# Patient Record
Sex: Female | Born: 1955 | Race: White | Hispanic: No | Marital: Single | Smoking: Former smoker
Health system: Southern US, Community
[De-identification: ages and names within clinical notes are randomized; demographics above are authoritative.]

## PROBLEM LIST (undated history)

## (undated) DIAGNOSIS — L8990 Pressure ulcer of unspecified site, unspecified stage: Secondary | ICD-10-CM

## (undated) DIAGNOSIS — F1010 Alcohol abuse, uncomplicated: Secondary | ICD-10-CM

## (undated) DIAGNOSIS — R410 Disorientation, unspecified: Secondary | ICD-10-CM

## (undated) DIAGNOSIS — D649 Anemia, unspecified: Secondary | ICD-10-CM

## (undated) DIAGNOSIS — F32A Depression, unspecified: Secondary | ICD-10-CM

## (undated) DIAGNOSIS — F322 Major depressive disorder, single episode, severe without psychotic features: Secondary | ICD-10-CM

## (undated) DIAGNOSIS — E876 Hypokalemia: Secondary | ICD-10-CM

## (undated) DIAGNOSIS — G9341 Metabolic encephalopathy: Secondary | ICD-10-CM

## (undated) DIAGNOSIS — N939 Abnormal uterine and vaginal bleeding, unspecified: Secondary | ICD-10-CM

## (undated) DIAGNOSIS — C541 Malignant neoplasm of endometrium: Secondary | ICD-10-CM

## (undated) HISTORY — DX: Alcohol abuse, uncomplicated: F10.10

## (undated) HISTORY — DX: Anemia, unspecified: D64.9

## (undated) HISTORY — DX: Major depressive disorder, single episode, severe without psychotic features: F32.2

## (undated) HISTORY — DX: Abnormal uterine and vaginal bleeding, unspecified: N93.9

## (undated) HISTORY — DX: Disorientation, unspecified: R41.0

## (undated) HISTORY — DX: Metabolic encephalopathy: G93.41

## (undated) HISTORY — DX: Hypomagnesemia: E83.42

## (undated) HISTORY — DX: Malignant neoplasm of endometrium: C54.1

## (undated) HISTORY — DX: Pressure ulcer of unspecified site, unspecified stage: L89.90

## (undated) HISTORY — DX: Hypokalemia: E87.6

## (undated) HISTORY — DX: Depression, unspecified: F32.A

---

## 2009-03-21 ENCOUNTER — Inpatient Hospital Stay: Payer: Self-pay | Admitting: Psychiatry

## 2009-04-20 ENCOUNTER — Ambulatory Visit: Payer: Self-pay

## 2020-07-24 ENCOUNTER — Inpatient Hospital Stay
Admission: EM | Admit: 2020-07-24 | Discharge: 2020-07-28 | DRG: 071 | Disposition: A | Discharge status: Home / Self Care | Payer: Self-pay | Attending: Internal Medicine | Admitting: Internal Medicine

## 2020-07-24 ENCOUNTER — Emergency Department: Payer: Self-pay

## 2020-07-24 DIAGNOSIS — Y92009 Unspecified place in unspecified non-institutional (private) residence as the place of occurrence of the external cause: Secondary | ICD-10-CM

## 2020-07-24 DIAGNOSIS — E871 Hypo-osmolality and hyponatremia: Secondary | ICD-10-CM | POA: Diagnosis present

## 2020-07-24 DIAGNOSIS — F322 Major depressive disorder, single episode, severe without psychotic features: Secondary | ICD-10-CM

## 2020-07-24 DIAGNOSIS — Y9301 Activity, walking, marching and hiking: Secondary | ICD-10-CM | POA: Diagnosis present

## 2020-07-24 DIAGNOSIS — E538 Deficiency of other specified B group vitamins: Secondary | ICD-10-CM | POA: Diagnosis present

## 2020-07-24 DIAGNOSIS — Z20822 Contact with and (suspected) exposure to covid-19: Secondary | ICD-10-CM | POA: Diagnosis present

## 2020-07-24 DIAGNOSIS — F1290 Cannabis use, unspecified, uncomplicated: Secondary | ICD-10-CM | POA: Diagnosis present

## 2020-07-24 DIAGNOSIS — F1020 Alcohol dependence, uncomplicated: Secondary | ICD-10-CM | POA: Diagnosis present

## 2020-07-24 DIAGNOSIS — Y92828 Other wilderness area as the place of occurrence of the external cause: Secondary | ICD-10-CM

## 2020-07-24 DIAGNOSIS — E876 Hypokalemia: Secondary | ICD-10-CM | POA: Diagnosis present

## 2020-07-24 DIAGNOSIS — W1830XA Fall on same level, unspecified, initial encounter: Secondary | ICD-10-CM | POA: Diagnosis present

## 2020-07-24 DIAGNOSIS — D649 Anemia, unspecified: Secondary | ICD-10-CM | POA: Diagnosis present

## 2020-07-24 DIAGNOSIS — R000 Tachycardia, unspecified: Secondary | ICD-10-CM | POA: Diagnosis present

## 2020-07-24 DIAGNOSIS — F10239 Alcohol dependence with withdrawal, unspecified: Secondary | ICD-10-CM | POA: Diagnosis present

## 2020-07-24 DIAGNOSIS — K760 Fatty (change of) liver, not elsewhere classified: Secondary | ICD-10-CM | POA: Diagnosis present

## 2020-07-24 DIAGNOSIS — K7010 Alcoholic hepatitis without ascites: Secondary | ICD-10-CM | POA: Diagnosis present

## 2020-07-24 DIAGNOSIS — R4182 Altered mental status, unspecified: Secondary | ICD-10-CM

## 2020-07-24 DIAGNOSIS — R17 Unspecified jaundice: Secondary | ICD-10-CM

## 2020-07-24 DIAGNOSIS — R410 Disorientation, unspecified: Secondary | ICD-10-CM

## 2020-07-24 DIAGNOSIS — G9341 Metabolic encephalopathy: Principal | ICD-10-CM

## 2020-07-24 DIAGNOSIS — F1010 Alcohol abuse, uncomplicated: Secondary | ICD-10-CM

## 2020-07-24 DIAGNOSIS — F32A Depression, unspecified: Secondary | ICD-10-CM

## 2020-07-24 DIAGNOSIS — E860 Dehydration: Secondary | ICD-10-CM | POA: Diagnosis present

## 2020-07-24 DIAGNOSIS — R471 Dysarthria and anarthria: Secondary | ICD-10-CM | POA: Diagnosis present

## 2020-07-24 DIAGNOSIS — Z87891 Personal history of nicotine dependence: Secondary | ICD-10-CM

## 2020-07-24 DIAGNOSIS — E872 Acidosis: Secondary | ICD-10-CM | POA: Diagnosis present

## 2020-07-24 DIAGNOSIS — M47812 Spondylosis without myelopathy or radiculopathy, cervical region: Secondary | ICD-10-CM | POA: Diagnosis present

## 2020-07-24 LAB — ETHANOL: Alcohol, Ethyl (B): <10 mg/dL (ref <10)

## 2020-07-24 LAB — CBC
HCT: 40 % (ref 36.0–46.0)
HCT: 30.3 % — ABNORMAL LOW (ref 36.0–46.0)
Hemoglobin: 14.1 g/dL (ref 12.0–15.0)
Hemoglobin: 11 g/dL — ABNORMAL LOW (ref 12.0–15.0)
MCH: 35.7 pg — ABNORMAL HIGH (ref 26.0–34.0)
MCH: 36.9 pg — ABNORMAL HIGH (ref 26.0–34.0)
MCHC: 35.3 g/dL (ref 30.0–36.0)
MCHC: 36.3 g/dL — ABNORMAL HIGH (ref 30.0–36.0)
MCV: 101.3 fL — ABNORMAL HIGH (ref 80.0–100.0)
MCV: 101.7 fL — ABNORMAL HIGH (ref 80.0–100.0)
Platelets: 470 10*3/uL — ABNORMAL HIGH (ref 150–400)
Platelets: 328 10*3/uL (ref 150–400)
RBC: 3.95 MIL/uL (ref 3.87–5.11)
RBC: 2.98 MIL/uL — ABNORMAL LOW (ref 3.87–5.11)
RDW: 15.9 % — ABNORMAL HIGH (ref 11.5–15.5)
RDW: 16 % — ABNORMAL HIGH (ref 11.5–15.5)
WBC: 11.8 10*3/uL — ABNORMAL HIGH (ref 4.0–10.5)
WBC: 12.8 10*3/uL — ABNORMAL HIGH (ref 4.0–10.5)
nRBC: 0.2 % (ref 0.0–0.2)
nRBC: 0.2 % (ref 0.0–0.2)

## 2020-07-24 LAB — URINALYSIS, COMPLETE (UACMP) WITH MICROSCOPIC
Glucose, UA: NEGATIVE mg/dL
Hgb urine dipstick: NEGATIVE
Ketones, ur: 20 mg/dL — AB
Leukocytes,Ua: NEGATIVE
Nitrite: NEGATIVE
Protein, ur: NEGATIVE mg/dL
Specific Gravity, Urine: 1.015 (ref 1.005–1.030)
pH: 5 (ref 5.0–8.0)

## 2020-07-24 LAB — MAGNESIUM
Magnesium: 1.6 mg/dL — ABNORMAL LOW (ref 1.7–2.4)
Magnesium: 1.9 mg/dL (ref 1.7–2.4)

## 2020-07-24 LAB — URINE DRUG SCREEN, QUALITATIVE (ARMC ONLY)
Amphetamines, Ur Screen: NOT DETECTED
Barbiturates, Ur Screen: NOT DETECTED
Benzodiazepine, Ur Scrn: NOT DETECTED
Cannabinoid 50 Ng, Ur ~~LOC~~: NOT DETECTED
Cocaine Metabolite,Ur ~~LOC~~: NOT DETECTED
MDMA (Ecstasy)Ur Screen: NOT DETECTED
Methadone Scn, Ur: NOT DETECTED
Opiate, Ur Screen: NOT DETECTED
Phencyclidine (PCP) Ur S: NOT DETECTED
Tricyclic, Ur Screen: NOT DETECTED

## 2020-07-24 LAB — COMPREHENSIVE METABOLIC PANEL
ALT: 26 U/L (ref 0–44)
AST: 80 U/L — ABNORMAL HIGH (ref 15–41)
Albumin: 2.7 g/dL — ABNORMAL LOW (ref 3.5–5.0)
Alkaline Phosphatase: 122 U/L (ref 38–126)
Anion gap: 19 — ABNORMAL HIGH (ref 5–15)
BUN: 9 mg/dL (ref 8–23)
CO2: 28 mmol/L (ref 22–32)
Calcium: 8.2 mg/dL — ABNORMAL LOW (ref 8.9–10.3)
Chloride: 85 mmol/L — ABNORMAL LOW (ref 98–111)
Creatinine, Ser: 0.73 mg/dL (ref 0.44–1.00)
GFR, Estimated: >60 mL/min (ref >60)
Glucose, Bld: 122 mg/dL — ABNORMAL HIGH (ref 70–99)
Potassium: 2.4 mmol/L — CL (ref 3.5–5.1)
Sodium: 132 mmol/L — ABNORMAL LOW (ref 135–145)
Total Bilirubin: 5.5 mg/dL — ABNORMAL HIGH (ref 0.3–1.2)
Total Protein: 7.2 g/dL (ref 6.5–8.1)

## 2020-07-24 LAB — PHOSPHORUS: Phosphorus: 1.7 mg/dL — ABNORMAL LOW (ref 2.5–4.6)

## 2020-07-24 LAB — AMMONIA: Ammonia: <9 umol/L — ABNORMAL LOW (ref 9–35)

## 2020-07-24 LAB — PROTIME-INR
INR: 1.3 — ABNORMAL HIGH (ref 0.8–1.2)
Prothrombin Time: 16.4 seconds — ABNORMAL HIGH (ref 11.4–15.2)

## 2020-07-24 LAB — LACTIC ACID, PLASMA: Lactic Acid, Venous: 3.4 mmol/L (ref 0.5–1.9)

## 2020-07-24 MED ORDER — ONDANSETRON HCL 4 MG/2ML IJ SOLN
4.0000 mg | Freq: Four times a day (QID) | INTRAMUSCULAR | Status: DC | PRN
  Administered 2020-07-24: 4 mg via INTRAVENOUS
  Filled 2020-07-24: qty 2

## 2020-07-24 MED ORDER — POTASSIUM CHLORIDE 10 MEQ/100ML IV SOLN
10.0000 meq | Freq: Once | INTRAVENOUS | Status: AC
  Administered 2020-07-24: 10 meq via INTRAVENOUS
  Filled 2020-07-24: qty 100

## 2020-07-24 MED ORDER — ENOXAPARIN SODIUM 40 MG/0.4ML ~~LOC~~ SOLN
40.0000 mg | SUBCUTANEOUS | Status: DC
  Administered 2020-07-24 – 2020-07-27 (×4): 40 mg via SUBCUTANEOUS
  Filled 2020-07-24 (×4): qty 0.4

## 2020-07-24 MED ORDER — FOLIC ACID 1 MG PO TABS
1.0000 mg | ORAL_TABLET | Freq: Every day | ORAL | Status: DC
  Administered 2020-07-24 – 2020-07-28 (×5): 1 mg via ORAL
  Filled 2020-07-24 (×5): qty 1

## 2020-07-24 MED ORDER — SODIUM CHLORIDE 0.9 % IV BOLUS
1000.0000 mL | Freq: Once | INTRAVENOUS | Status: AC
  Administered 2020-07-24: 1000 mL via INTRAVENOUS

## 2020-07-24 MED ORDER — MAGNESIUM SULFATE 2 GM/50ML IV SOLN
2.0000 g | Freq: Once | INTRAVENOUS | Status: AC
  Administered 2020-07-24: 2 g via INTRAVENOUS
  Filled 2020-07-24: qty 50

## 2020-07-24 MED ORDER — ADULT MULTIVITAMIN W/MINERALS CH
1.0000 | ORAL_TABLET | Freq: Every day | ORAL | Status: DC
  Administered 2020-07-24 – 2020-07-28 (×5): 1 via ORAL
  Filled 2020-07-24 (×5): qty 1

## 2020-07-24 MED ORDER — THIAMINE HCL 100 MG PO TABS
100.0000 mg | ORAL_TABLET | Freq: Every day | ORAL | Status: DC
  Administered 2020-07-25 – 2020-07-26 (×2): 100 mg via ORAL
  Filled 2020-07-24 (×2): qty 1

## 2020-07-24 MED ORDER — IBUPROFEN 400 MG PO TABS: 400.0000 mg | ORAL_TABLET | Freq: Four times a day (QID) | ORAL | Status: DC | PRN

## 2020-07-24 MED ORDER — SODIUM CHLORIDE 0.9 % IV BOLUS
500.0000 mL | Freq: Once | INTRAVENOUS | Status: AC
  Administered 2020-07-24: 500 mL via INTRAVENOUS

## 2020-07-24 MED ORDER — LORAZEPAM 1 MG PO TABS
1.0000 mg | ORAL_TABLET | ORAL | Status: DC | PRN
  Administered 2020-07-24: 1 mg via ORAL
  Filled 2020-07-24: qty 1

## 2020-07-24 MED ORDER — SODIUM CHLORIDE 0.9 % IV SOLN
1.0000 g | INTRAVENOUS | Status: DC
  Administered 2020-07-24 – 2020-07-25 (×2): 1 g via INTRAVENOUS
  Filled 2020-07-24 (×3): qty 10

## 2020-07-24 MED ORDER — SENNOSIDES-DOCUSATE SODIUM 8.6-50 MG PO TABS: 1.0000 | ORAL_TABLET | Freq: Every evening | ORAL | Status: DC | PRN

## 2020-07-24 MED ORDER — IOHEXOL 300 MG/ML  SOLN
100.0000 mL | Freq: Once | INTRAMUSCULAR | Status: AC | PRN
  Administered 2020-07-24: 100 mL via INTRAVENOUS

## 2020-07-24 MED ORDER — POTASSIUM CHLORIDE IN NACL 20-0.9 MEQ/L-% IV SOLN
INTRAVENOUS | Status: DC
  Filled 2020-07-24 (×3): qty 1000

## 2020-07-24 MED ORDER — POTASSIUM CHLORIDE CRYS ER 20 MEQ PO TBCR
40.0000 meq | EXTENDED_RELEASE_TABLET | Freq: Once | ORAL | Status: AC
  Administered 2020-07-24: 40 meq via ORAL
  Filled 2020-07-24: qty 2

## 2020-07-24 MED ORDER — SODIUM CHLORIDE 0.9 % IV SOLN: Freq: Once | INTRAVENOUS | Status: AC

## 2020-07-24 MED ORDER — THIAMINE HCL 100 MG/ML IJ SOLN
Freq: Once | INTRAVENOUS | Status: AC
  Filled 2020-07-24: qty 1000

## 2020-07-24 MED ORDER — ONDANSETRON HCL 4 MG PO TABS: 4.0000 mg | ORAL_TABLET | Freq: Four times a day (QID) | ORAL | Status: DC | PRN

## 2020-07-24 MED ORDER — POTASSIUM CHLORIDE 10 MEQ/100ML IV SOLN
10.0000 meq | INTRAVENOUS | Status: AC
  Administered 2020-07-24 (×4): 10 meq via INTRAVENOUS
  Filled 2020-07-24 (×4): qty 100

## 2020-07-24 MED ORDER — LORAZEPAM 2 MG/ML IJ SOLN
1.0000 mg | INTRAMUSCULAR | Status: DC | PRN
  Administered 2020-07-24: 2 mg via INTRAVENOUS
  Administered 2020-07-24 – 2020-07-25 (×2): 3 mg via INTRAVENOUS
  Filled 2020-07-24: qty 1
  Filled 2020-07-24: qty 2
  Filled 2020-07-24 (×2): qty 1

## 2020-07-24 MED ORDER — FOLIC ACID 1 MG PO TABS: 1.0000 mg | ORAL_TABLET | Freq: Every day | ORAL | Status: DC

## 2020-07-24 NOTE — ED Notes (Signed)
Patient in CT during shift report.

## 2020-07-24 NOTE — ED Triage Notes (Signed)
Pt BIB EMS from home for accidental fall. Pt states she slid out of the bed.  Pt has hx of ETOH abuse per EMS.

## 2020-07-24 NOTE — H&P (Signed)
Chief Complaint:  HPI: I fell earlier today and felt unwell after that Valerie Leonard is an 65 y.o. female with no significant past medical history except for chronic alcohol abuse and remote history of tobacco smoking, quit several years ago.  She is a poor historian and had difficulty recalling how she came to the ER.  She admits to drinking alcohol on daily, usually drinks wine.  She also smokes THC frequently.  As per patient, she was walking up a hill across her house to neighbors house when she accidentally slid and fell.  Denied hitting her head.  Denied any bone pains or difficulty with ambulation after fall.  Denied loss of consciousness but had difficulty recalling preceding events prior to fall.  She felt woozy after the fall , hence neighbor suggested EMS to be called and patient brought to the emergency room.In review of ED records however, patient had reported tripping on a rug at home.  History reviewed. No pertinent past medical history.  History reviewed. No pertinent surgical history.  No family history on file. Social History:  reports that she has quit smoking. Her smoking use included cigarettes and cigars. She has never used smokeless tobacco. She reports current alcohol use. She reports previous drug use.  Allergies: Not on File  (Not in a hospital admission)   Results for orders placed or performed during the hospital encounter of 07/24/20 (from the past 48 hour(s))  Ethanol     Status: None   Collection Time: 07/24/20  3:21 PM  Result Value Ref Range   Alcohol, Ethyl (B) <10 <10 mg/dL    Comment: (NOTE) Lowest detectable limit for serum alcohol is 10 mg/dL.  For medical purposes only. Performed at [facility], [address] [place], [place] [address]   CBC     Status: Abnormal   Collection Time: 07/24/20  3:21 PM  Result Value Ref Range   WBC 11.8 (H) 4.0 - 10.5 K/uL   RBC 3.95 3.87 - 5.11 MIL/uL   Hemoglobin 14.1 12.0 - 15.0 g/dL   HCT 10.240.0 72.536.0  - 36.646.0 %   MCV 101.3 (H) 80.0 - 100.0 fL   MCH 35.7 (H) 26.0 - 34.0 pg   MCHC 35.3 30.0 - 36.0 g/dL   RDW 44.015.9 (H) 34.711.5 - 42.515.5 %   Platelets 470 (H) 150 - 400 K/uL   nRBC 0.2 0.0 - 0.2 %    Comment: Performed at [facility], [address] [place], [place] [address]  Comprehensive metabolic panel     Status: Abnormal   Collection Time: 07/24/20  3:21 PM  Result Value Ref Range   Sodium 132 (L) 135 - 145 mmol/L   Potassium 2.4 (LL) 3.5 - 5.1 mmol/L    Comment: CRITICAL RESULT CALLED TO, READ BACK BY AND VERIFIED WITH RYAN WHITE AT 1610 ON 07/24/20 BY SS    Chloride 85 (L) 98 - 111 mmol/L   CO2 28 22 - 32 mmol/L   Glucose, Bld 122 (H) 70 - 99 mg/dL    Comment: Glucose reference range applies only to samples taken after fasting for at least 8 hours.   BUN 9 8 - 23 mg/dL   Creatinine, Ser 7.560.73 0.44 - 1.00 mg/dL   Calcium 8.2 (L) 8.9 - 10.3 mg/dL   Total Protein 7.2 6.5 - 8.1 g/dL   Albumin 2.7 (L) 3.5 - 5.0 g/dL   AST 80 (H) 15 - 41 U/L   ALT 26 0 - 44 U/L  Alkaline Phosphatase 122 38 - 126 U/L   Total Bilirubin 5.5 (H) 0.3 - 1.2 mg/dL   GFR, Estimated >16 >10 mL/min    Comment: (NOTE) Calculated using the CKD-EPI Creatinine Equation (2021)    Anion gap 19 (H) 5 - 15    Comment: Performed at [facility], [address] [place], [place] [address]  Magnesium     Status: Abnormal   Collection Time: 07/24/20  3:35 PM  Result Value Ref Range   Magnesium 1.6 (L) 1.7 - 2.4 mg/dL    Comment: Performed at [facility], [address] [place], [place] [address]  Urinalysis, Complete w Microscopic     Status: Abnormal   Collection Time: 07/24/20  4:47 PM  Result Value Ref Range   Color, Urine AMBER (A) YELLOW    Comment: BIOCHEMICALS MAY BE AFFECTED BY COLOR   APPearance HAZY (A) CLEAR   Specific Gravity, Urine 1.015 1.005 - 1.030   pH 5.0 5.0 - 8.0   Glucose, UA NEGATIVE NEGATIVE mg/dL   Hgb urine dipstick NEGATIVE NEGATIVE   Bilirubin Urine  SMALL (A) NEGATIVE   Ketones, ur 20 (A) NEGATIVE mg/dL   Protein, ur NEGATIVE NEGATIVE mg/dL   Nitrite NEGATIVE NEGATIVE   Leukocytes,Ua NEGATIVE NEGATIVE   RBC / HPF 0-5 0 - 5 RBC/hpf   WBC, UA 0-5 0 - 5 WBC/hpf   Bacteria, UA RARE (A) NONE SEEN   Squamous Epithelial / LPF 0-5 0 - 5   Mucus PRESENT    Hyaline Casts, UA PRESENT     Comment: Performed at [facility], [address] [place], [place] [address]  Urine Drug Screen, Qualitative ([facility] only)     Status: None   Collection Time: 07/24/20  4:47 PM  Result Value Ref Range   Tricyclic, Ur Screen NONE DETECTED NONE DETECTED   Amphetamines, Ur Screen NONE DETECTED NONE DETECTED   MDMA (Ecstasy)Ur Screen NONE DETECTED NONE DETECTED   Cocaine Metabolite,Ur [place] NONE DETECTED NONE DETECTED   Opiate, Ur Screen NONE DETECTED NONE DETECTED   Phencyclidine (PCP) Ur S NONE DETECTED NONE DETECTED   Cannabinoid 50 Ng, Ur [place] NONE DETECTED NONE DETECTED   Barbiturates, Ur Screen NONE DETECTED NONE DETECTED   Benzodiazepine, Ur Scrn NONE DETECTED NONE DETECTED   Methadone Scn, Ur NONE DETECTED NONE DETECTED    Comment: (NOTE) Tricyclics + metabolites, urine    Cutoff 1000 ng/mL Amphetamines + metabolites, urine  Cutoff 1000 ng/mL MDMA (Ecstasy), urine              Cutoff 500 ng/mL Cocaine Metabolite, urine          Cutoff 300 ng/mL Opiate + metabolites, urine        Cutoff 300 ng/mL Phencyclidine (PCP), urine         Cutoff 25 ng/mL Cannabinoid, urine                 Cutoff 50 ng/mL Barbiturates + metabolites, urine  Cutoff 200 ng/mL Benzodiazepine, urine              Cutoff 200 ng/mL Methadone, urine                   Cutoff 300 ng/mL  The urine drug screen provides only a preliminary, unconfirmed analytical test result and should not be used for non-medical purposes. Clinical consideration and professional judgment should be applied to any positive drug screen result due to possible interfering substances. A more specific  alternate chemical method must be used in order to obtain a confirmed analytical result. Gas chromatography / mass spectrometry (GC/MS) is the preferred confirm atory method. Performed at [facility], [address] [place], [place] [address]   Protime-INR     Status: Abnormal   Collection Time: 07/24/20  4:47 PM  Result Value Ref Range   Prothrombin Time 16.4 (H) 11.4 - 15.2 seconds   INR 1.3 (H) 0.8 - 1.2    Comment: (NOTE) INR goal varies based on device and disease states. Performed at [facility], [address] [place], [place] [address]   Lactic acid, plasma     Status: Abnormal   Collection Time: 07/24/20  5:56 PM  Result Value Ref Range   Lactic Acid, Venous 3.4 (HH) 0.5 - 1.9 mmol/L    Comment: CRITICAL RESULT CALLED TO, READ BACK BY AND VERIFIED WITH RYAN WHITE AT 1827 ON 07/24/20 BY SS Performed at [facility], [address] [place], [place] [address]   Ammonia     Status: Abnormal   Collection Time: 07/24/20  5:56 PM  Result Value Ref Range   Ammonia <9 (L) 9 - 35 umol/L    Comment: Performed at [facility], [address], [place], [place] [address]   CT Head Wo Contrast  Result Date: 07/24/2020 CLINICAL DATA:  Larey Seat out of bed. EXAM: CT HEAD WITHOUT CONTRAST CT CERVICAL SPINE WITHOUT CONTRAST TECHNIQUE: Multidetector CT imaging of the head and cervical spine was performed following the standard protocol without intravenous contrast. Multiplanar CT image reconstructions of the cervical spine were also generated. COMPARISON:  None. FINDINGS: CT HEAD FINDINGS Brain: No evidence of acute infarction, hemorrhage, hydrocephalus, extra-axial collection or mass lesion/mass effect. Mild generalized cerebral atrophy. Scattered moderate periventricular and subcortical white matter hypodensities are nonspecific, but favored to reflect chronic microvascular ischemic changes. Vascular: Calcified atherosclerosis at the skullbase. No  hyperdense vessel. Skull: Normal. Negative for fracture or focal lesion. Sinuses/Orbits: No acute finding. Other: None. CT CERVICAL SPINE FINDINGS Alignment: No traumatic malalignment. Reversal of the normal cervical lordosis. Skull base and vertebrae: No acute fracture. No primary bone lesion or focal pathologic process. Soft tissues and spinal canal: No prevertebral fluid or swelling. No visible canal hematoma. Disc levels: Mild C4-C5, moderate C5-C6, and severe C6-C7 disc height loss with associated endplate spurring. Upper chest: Negative. Other: None. IMPRESSION: 1. No acute intracranial abnormality. Mild cerebral atrophy and moderate chronic microvascular ischemic changes. 2. No acute cervical spine fracture or traumatic malalignment. Multilevel cervical spondylosis, severe at C6-C7. Electronically Signed   By: Obie Dredge M.D.   On: 07/24/2020 16:09   CT Cervical Spine Wo Contrast  Result Date: 07/24/2020 CLINICAL DATA:  Larey Seat out of bed. EXAM: CT HEAD WITHOUT CONTRAST CT CERVICAL SPINE WITHOUT CONTRAST TECHNIQUE: Multidetector CT imaging of the head and cervical spine was performed following the standard protocol without intravenous contrast. Multiplanar CT image reconstructions of the cervical spine were also generated. COMPARISON:  None. FINDINGS: CT HEAD FINDINGS Brain: No evidence of acute infarction, hemorrhage, hydrocephalus, extra-axial collection or mass lesion/mass effect. Mild generalized cerebral atrophy. Scattered moderate periventricular and subcortical white matter hypodensities are nonspecific, but favored to reflect chronic microvascular ischemic changes. Vascular: Calcified atherosclerosis at the skullbase. No hyperdense vessel. Skull: Normal. Negative for fracture or focal lesion. Sinuses/Orbits: No acute finding. Other: None. CT CERVICAL SPINE FINDINGS Alignment: No traumatic malalignment. Reversal of the normal cervical lordosis. Skull base and vertebrae: No acute fracture. No  primary  bone lesion or focal pathologic process. Soft tissues and spinal canal: No prevertebral fluid or swelling. No visible canal hematoma. Disc levels: Mild C4-C5, moderate C5-C6, and severe C6-C7 disc height loss with associated endplate spurring. Upper chest: Negative. Other: None. IMPRESSION: 1. No acute intracranial abnormality. Mild cerebral atrophy and moderate chronic microvascular ischemic changes. 2. No acute cervical spine fracture or traumatic malalignment. Multilevel cervical spondylosis, severe at C6-C7. Electronically Signed   By: Obie Dredge M.D.   On: 07/24/2020 16:09   CT ABDOMEN PELVIS W CONTRAST  Result Date: 07/24/2020 CLINICAL DATA:  Fall EXAM: CT ABDOMEN AND PELVIS WITH CONTRAST TECHNIQUE: Multidetector CT imaging of the abdomen and pelvis was performed using the standard protocol following bolus administration of intravenous contrast. CONTRAST:  OMNIPAQUE IOHEXOL 300 MG/ML  SOLN COMPARISON:  None. FINDINGS: Lower chest: No acute abnormality. Hepatobiliary: Diffuse heterogeneous low-density throughout the liver, likely fatty infiltration. No evidence of hepatic injury or perihepatic hematoma. Gallbladder unremarkable. Pancreas: No focal abnormality or ductal dilatation. Spleen: No splenic injury or perisplenic hematoma. Adrenals/Urinary Tract: No adrenal hemorrhage or renal injury identified. Bladder is unremarkable. Stomach/Bowel: Stomach, large and small bowel grossly unremarkable. Vascular/Lymphatic: Aortic atherosclerosis. No evidence of aneurysm or adenopathy. Reproductive: Uterus and adnexa unremarkable.  No mass. Other: No free fluid or free air. Musculoskeletal: No acute bony abnormality. IMPRESSION: No acute findings or evidence of significant traumatic injury in the abdomen or pelvis. Hepatic steatosis. Aortic atherosclerosis. Electronically Signed   By: Charlett Nose M.D.   On: 07/24/2020 19:37   DG Chest Portable 1 View  Result Date: 07/24/2020 CLINICAL DATA:   Recent fall EXAM: PORTABLE CHEST 1 VIEW COMPARISON:  None. FINDINGS: The heart size and mediastinal contours are within normal limits. Both lungs are clear. The visualized skeletal structures are unremarkable. IMPRESSION: No acute abnormality noted. Electronically Signed   By: Alcide Clever M.D.   On: 07/24/2020 16:54   US ABDOMEN LIMITED RUQ (LIVER/GB)  Result Date: 07/24/2020 CLINICAL DATA:  Elevated bilirubin EXAM: ULTRASOUND ABDOMEN LIMITED RIGHT UPPER QUADRANT COMPARISON:  None. FINDINGS: Gallbladder: Gallbladder is well distended with considerable gallbladder sludge within. No cholelithiasis is noted. No wall thickening is seen. Common bile duct: Diameter: 4 mm. Liver: Diffusely increased in echogenicity consistent with fatty infiltration. No mass lesion is noted. Portal vein is patent on color Doppler imaging with normal direction of blood flow towards the liver. Other: None. IMPRESSION: Gallbladder sludge without complicating factors. No cholelithiasis is seen. Fatty liver. Electronically Signed   By: Alcide Clever M.D.   On: 07/24/2020 17:54    Review of Systems  Constitutional: Negative.   HENT: Negative.   Eyes: Negative.   Respiratory: Negative.   Cardiovascular: Negative.   Gastrointestinal: Negative.   Endocrine: Negative.   Genitourinary: Negative.   Musculoskeletal: Negative.   Allergic/Immunologic: Negative.     Blood pressure 116/75, pulse (!) 109, temperature (!) 97.5 F (36.4 C), temperature source Oral, resp. rate 16, height 5\' 1"  (1.549 m), weight 59 kg, SpO2 99 %. Physical Exam Vitals and nursing note reviewed. Exam conducted with a chaperone present.  HENT:     Head: Normocephalic and atraumatic.     Nose: Nose normal.     Mouth/Throat:     Mouth: Mucous membranes are dry.     Pharynx: Oropharynx is clear.  Cardiovascular:     Rate and Rhythm: Regular rhythm. Tachycardia present.     Pulses: Normal pulses.     Heart sounds: Normal heart sounds.   Musculoskeletal:  Cervical back: Normal range of motion and neck supple.  Skin:    General: Skin is warm and dry.  Neurological:     General: No focal deficit present.     Mental Status: She is alert and oriented to person, place, and time. Mental status is at baseline.  Psychiatric:        Mood and Affect: Mood normal.        Thought Content: Thought content normal.      Assessment/Plan  65 year old female with no significant past medical history except for chronic alcohol abuse.  Admitted on account of mechanical fall without any obvious fractures.  Work-up in the ED revealed hypokalemia and elevated lactic acidosis suggesting dehydration.  Patient is at an increased risk of alcohol withdrawal.  Being admitted for 23 hours observation for correction of electrolyte abnormalities.  #1.  Mechanical fall: Most likely accidental without any associated encephalopathy.  CT head and neck were negative for any acute abnormality.  Incidental finding of multilevel cervical spondylosis noted.  Patient is asymptomatic.  #2.  Acute hypokalemia and Hypomagnesemia: Most likely due to poor oral intake.  No obvious EKG abnormalities at this time.  Serum magnesium and potassium will be repleted as appropriate.  #3.  Alcohol dependence and abuse: Alcohol level were normal on admission.  No evidence of alcohol withdrawal at this time.  Alcohol withdrawal protocol will be instituted with lorazepam as needed.  Thiamine and folate replacement therapy will be scheduled.  #4.  Alcoholic liver disease:Elevated AST and total bilirubin noted.  CT scan shows hepatic steatosis without any acute intra-abdominal findings.  #5.  Elevated lactic acidosis: 3.4.  Patient will be adequately volume repleted.  Follow-up with lactic acidosis in AM.  #6.Hyponatremia- Dehydration related  Anticipate 23 observation with possible discharge.  PT OT to assess for gait abnormality prior to discharge.  Will de-escalate  antibiotics as urinalysis does not suggest UTI. Lilia Pro, MD 07/24/2020, 7:58 PM

## 2020-07-24 NOTE — ED Provider Notes (Signed)
[facility] Emergency Department Provider Note    Event Date/Time   First MD Initiated Contact with Patient 07/24/20 1516     (approximate)  I have reviewed the triage vital signs and the nursing notes.   HISTORY  Chief Complaint Fall  Level V Caveat:  AMS - poor historian  HPI Valerie Leonard is a 65 y.o. female   with reported history of alcohol use presents to the ER from home after fall.  Patient states that she tripped on a rug.  Is having some neck pain and abdominal pain.  Cannot remember if she lost consciousness.  Denies any other pain.  Patient not providing additional history or reasons why she was brought to the ER.  No other symptoms.  Seems confused and an unreliable historian.  EMS reports heavy etoh use suspected.   History reviewed. No pertinent past medical history. No family history on file.  There are no problems to display for this patient.     Prior to Admission medications   Not on File    Allergies Patient has no allergy information on record.    Social History Social History   Tobacco Use  . Smoking status: Former Smoker  Substance Use Topics  . Alcohol use: Yes    Comment: More than 4 glasses of wine or mixed drinks daily.   . Drug use: Never    Comment: former use of marijuana    Review of Systems Patient denies headaches, rhinorrhea, blurry vision, numbness, shortness of breath, chest pain, edema, cough, abdominal pain, nausea, vomiting, diarrhea, dysuria, fevers, rashes or hallucinations unless otherwise stated above in HPI. ____________________________________________   PHYSICAL EXAM:  VITAL SIGNS: Vitals:   07/24/20 1734 07/24/20 1810  BP: (!) 96/51   Pulse: 92   Resp: 10   Temp:  (!) 97.5 F (36.4 C)  SpO2: 100%     Constitutional: Alert, disheveled appearing, non toxic, Eyes: Conjunctivae are normal.  Head: Atraumatic. Nose: No congestion/rhinnorhea. Mouth/Throat: Mucous membranes are dry     Neck: No stridor. Painless ROM.  Mild midline neck pain without stepoffs or deformity  Cardiovascular: Normal rate, regular rhythm. Grossly normal heart sounds.  Good peripheral circulation. Respiratory: Normal respiratory effort.  No retractions. Lungs CTAB. Gastrointestinal: Soft and nontender. No distention. No abdominal bruits. No CVA tenderness. Genitourinary: deferred Musculoskeletal: No lower extremity tenderness nor edema.  No joint effusions. Neurologic:  Normal speech and language. No gross focal neurologic deficits are appreciated. No facial droop Skin:  Skin is warm, dry and intact. No rash noted. Psychiatric: calm and cooperative ____________________________________________   LABS (all labs ordered are listed, but only abnormal results are displayed)  Results for orders placed or performed during the hospital encounter of 07/24/20 (from the past 24 hour(s))  Ethanol     Status: None   Collection Time: 07/24/20  3:21 PM  Result Value Ref Range   Alcohol, Ethyl (B) <10 <10 mg/dL  CBC     Status: Abnormal   Collection Time: 07/24/20  3:21 PM  Result Value Ref Range   WBC 11.8 (H) 4.0 - 10.5 K/uL   RBC 3.95 3.87 - 5.11 MIL/uL   Hemoglobin 14.1 12.0 - 15.0 g/dL   HCT 00.8 67.6 - 19.5 %   MCV 101.3 (H) 80.0 - 100.0 fL   MCH 35.7 (H) 26.0 - 34.0 pg   MCHC 35.3 30.0 - 36.0 g/dL   RDW 09.3 (H) 26.7 - 12.4 %   Platelets 470 (H)  150 - 400 K/uL   nRBC 0.2 0.0 - 0.2 %  Comprehensive metabolic panel     Status: Abnormal   Collection Time: 07/24/20  3:21 PM  Result Value Ref Range   Sodium 132 (L) 135 - 145 mmol/L   Potassium 2.4 (LL) 3.5 - 5.1 mmol/L   Chloride 85 (L) 98 - 111 mmol/L   CO2 28 22 - 32 mmol/L   Glucose, Bld 122 (H) 70 - 99 mg/dL   BUN 9 8 - 23 mg/dL   Creatinine, Ser 2.83 0.44 - 1.00 mg/dL   Calcium 8.2 (L) 8.9 - 10.3 mg/dL   Total Protein 7.2 6.5 - 8.1 g/dL   Albumin 2.7 (L) 3.5 - 5.0 g/dL   AST 80 (H) 15 - 41 U/L   ALT 26 0 - 44 U/L   Alkaline  Phosphatase 122 38 - 126 U/L   Total Bilirubin 5.5 (H) 0.3 - 1.2 mg/dL   GFR, Estimated >15 >17 mL/min   Anion gap 19 (H) 5 - 15  Magnesium     Status: Abnormal   Collection Time: 07/24/20  3:35 PM  Result Value Ref Range   Magnesium 1.6 (L) 1.7 - 2.4 mg/dL  Urinalysis, Complete w Microscopic     Status: Abnormal   Collection Time: 07/24/20  4:47 PM  Result Value Ref Range   Color, Urine AMBER (A) YELLOW   APPearance HAZY (A) CLEAR   Specific Gravity, Urine 1.015 1.005 - 1.030   pH 5.0 5.0 - 8.0   Glucose, UA NEGATIVE NEGATIVE mg/dL   Hgb urine dipstick NEGATIVE NEGATIVE   Bilirubin Urine SMALL (A) NEGATIVE   Ketones, ur 20 (A) NEGATIVE mg/dL   Protein, ur NEGATIVE NEGATIVE mg/dL   Nitrite NEGATIVE NEGATIVE   Leukocytes,Ua NEGATIVE NEGATIVE   RBC / HPF 0-5 0 - 5 RBC/hpf   WBC, UA 0-5 0 - 5 WBC/hpf   Bacteria, UA RARE (A) NONE SEEN   Squamous Epithelial / LPF 0-5 0 - 5   Mucus PRESENT    Hyaline Casts, UA PRESENT   Urine Drug Screen, Qualitative ([facility] only)     Status: None   Collection Time: 07/24/20  4:47 PM  Result Value Ref Range   Tricyclic, Ur Screen NONE DETECTED NONE DETECTED   Amphetamines, Ur Screen NONE DETECTED NONE DETECTED   MDMA (Ecstasy)Ur Screen NONE DETECTED NONE DETECTED   Cocaine Metabolite,Ur [place] NONE DETECTED NONE DETECTED   Opiate, Ur Screen NONE DETECTED NONE DETECTED   Phencyclidine (PCP) Ur S NONE DETECTED NONE DETECTED   Cannabinoid 50 Ng, Ur  NONE DETECTED NONE DETECTED   Barbiturates, Ur Screen NONE DETECTED NONE DETECTED   Benzodiazepine, Ur Scrn NONE DETECTED NONE DETECTED   Methadone Scn, Ur NONE DETECTED NONE DETECTED  Protime-INR     Status: Abnormal   Collection Time: 07/24/20  4:47 PM  Result Value Ref Range   Prothrombin Time 16.4 (H) 11.4 - 15.2 seconds   INR 1.3 (H) 0.8 - 1.2  Lactic acid, plasma     Status: Abnormal   Collection Time: 07/24/20  5:56 PM  Result Value Ref Range   Lactic Acid, Venous 3.4 (HH) 0.5 - 1.9 mmol/L   Ammonia     Status: Abnormal   Collection Time: 07/24/20  5:56 PM  Result Value Ref Range   Ammonia <9 (L) 9 - 35 umol/L   ____________________________________________  EKG My review and personal interpretation at Time: 16:09   Indication: ams  Rate: 95  Rhythm:  sinus Axis: leftward Other: normal intervals, nonspecific st abn, no stemi ____________________________________________  RADIOLOGY  I personally reviewed all radiographic images ordered to evaluate for the above acute complaints and reviewed radiology reports and findings.  These findings were personally discussed with the patient.  Please see medical record for radiology report.  ____________________________________________   PROCEDURES  Procedure(s) performed:  .Critical Care Performed by: Willy Eddy, MD Authorized by: Willy Eddy, MD   Critical care provider statement:    Critical care time (minutes):  40   Critical care time was exclusive of:  Separately billable procedures and treating other patients   Critical care was necessary to treat or prevent imminent or life-threatening deterioration of the following conditions:  Circulatory failure   Critical care was time spent personally by me on the following activities:  Development of treatment plan with patient or surrogate, discussions with consultants, evaluation of patient's response to treatment, examination of patient, obtaining history from patient or surrogate, ordering and performing treatments and interventions, ordering and review of laboratory studies, ordering and review of radiographic studies, pulse oximetry, re-evaluation of patient's condition and review of old charts      Critical Care performed: yes ____________________________________________   INITIAL IMPRESSION / ASSESSMENT AND PLAN / ED COURSE  Pertinent labs & imaging results that were available during my care of the patient were reviewed by me and considered in my medical  decision making (see chart for details).   DDX: concussion, sdh, sah, iph, fracture, etoh abuse, dehydration, electrolyte abn  Valerie Leonard is a 66 y.o. who presents to the ED with presentation as described above.  Patient disheveled frail-appearing seems very confused and unreliable historian.  Possible substance abuse.  Given recent fall will order CT imaging we will check blood work.  We will give IV hydration as blood pressure low.  Appears very dry clinically.  The patient will be placed on continuous pulse oximetry and telemetry for monitoring.  Laboratory evaluation will be sent to evaluate for the above complaints.     Clinical Course as of 07/24/20 1931  Tue Jul 24, 2020  1632 Noted significant hypokalemia as well as elevated T bilirubin with no previous labs.  Will give/IV hydration as well as order right upper quadrant ultrasound add on magnesium as well as PT/INR. [PR]  1745 AST(!): 80 [PR]  1852 Mother at bedside says that the patient has been deteriorating over the past several weeks.  Has had poor p.o. intake.  Lactate is elevated mild white count she is afebrile but will order blood cultures as well as give dose Rocephin.  She was unable to give urine specimen appears to be oliguric has only had 150 cc in bladder.  This purulent and dark therefore will cover for UTI.  Does have some mild abdominal pain now therefore will order CT imaging. [PR]  1920 My review of CT imaging and do not see evidence of obstructive pattern.  Urinalysis dark no clear sign of infection.  Lactic acidemia may be secondary to underlying liver disease and/or profound dehydration.  No other signs or source of infection given absence of fever makes it less likely.  Given her metabolic derangements lactic acidemia dehydration and presentation will discuss with hospitalist for admission. [PR]    Clinical Course User Index [PR] Willy Eddy, MD    The patient was evaluated in Emergency Department today for  the symptoms described in the history of present illness. He/she was evaluated in the context of the global COVID-19 pandemic,  which necessitated consideration that the patient might be at risk for infection with the SARS-CoV-2 virus that causes COVID-19. Institutional protocols and algorithms that pertain to the evaluation of patients at risk for COVID-19 are in a state of rapid change based on information released by regulatory bodies including the CDC and federal and state organizations. These policies and algorithms were followed during the patient's care in the ED.  As part of my medical decision making, I reviewed the following data within the electronic MEDICAL RECORD NUMBER Nursing notes reviewed and incorporated, Labs reviewed, notes from prior ED visits and [place] Controlled Substance Database   ____________________________________________   FINAL CLINICAL IMPRESSION(S) / ED DIAGNOSES  Final diagnoses:  Elevated bilirubin  Dehydration  Hypomagnesemia  Hypokalemia  Altered mental status, unspecified altered mental status type      NEW MEDICATIONS STARTED DURING THIS VISIT:  New Prescriptions   No medications on file     Note:  This document was prepared using Dragon voice recognition software and may include unintentional dictation errors.    Willy Eddyobinson, Pascal Stiggers, MD 07/24/20 [phone]

## 2020-07-25 DIAGNOSIS — R410 Disorientation, unspecified: Secondary | ICD-10-CM

## 2020-07-25 DIAGNOSIS — E876 Hypokalemia: Secondary | ICD-10-CM

## 2020-07-25 DIAGNOSIS — F32A Depression, unspecified: Secondary | ICD-10-CM

## 2020-07-25 DIAGNOSIS — G9341 Metabolic encephalopathy: Principal | ICD-10-CM

## 2020-07-25 DIAGNOSIS — F1010 Alcohol abuse, uncomplicated: Secondary | ICD-10-CM

## 2020-07-25 LAB — COMPREHENSIVE METABOLIC PANEL
ALT: 20 U/L (ref 0–44)
AST: 49 U/L — ABNORMAL HIGH (ref 15–41)
Albumin: 2 g/dL — ABNORMAL LOW (ref 3.5–5.0)
Alkaline Phosphatase: 84 U/L (ref 38–126)
Anion gap: 12 (ref 5–15)
BUN: 5 mg/dL — ABNORMAL LOW (ref 8–23)
CO2: 24 mmol/L (ref 22–32)
Calcium: 6.8 mg/dL — ABNORMAL LOW (ref 8.9–10.3)
Chloride: 101 mmol/L (ref 98–111)
Creatinine, Ser: 0.35 mg/dL — ABNORMAL LOW (ref 0.44–1.00)
GFR, Estimated: >60 mL/min (ref >60)
Glucose, Bld: 80 mg/dL (ref 70–99)
Potassium: 3.4 mmol/L — ABNORMAL LOW (ref 3.5–5.1)
Sodium: 137 mmol/L (ref 135–145)
Total Bilirubin: 3.3 mg/dL — ABNORMAL HIGH (ref 0.3–1.2)
Total Protein: 5.4 g/dL — ABNORMAL LOW (ref 6.5–8.1)

## 2020-07-25 LAB — VITAMIN B12: Vitamin B-12: 760 pg/mL (ref 180–914)

## 2020-07-25 LAB — CBC
HCT: 32.6 % — ABNORMAL LOW (ref 36.0–46.0)
Hemoglobin: 11.2 g/dL — ABNORMAL LOW (ref 12.0–15.0)
MCH: 35.9 pg — ABNORMAL HIGH (ref 26.0–34.0)
MCHC: 34.4 g/dL (ref 30.0–36.0)
MCV: 104.5 fL — ABNORMAL HIGH (ref 80.0–100.0)
Platelets: 295 10*3/uL (ref 150–400)
RBC: 3.12 MIL/uL — ABNORMAL LOW (ref 3.87–5.11)
RDW: 16.3 % — ABNORMAL HIGH (ref 11.5–15.5)
WBC: 9.7 10*3/uL (ref 4.0–10.5)
nRBC: 0 % (ref 0.0–0.2)

## 2020-07-25 LAB — HIV ANTIBODY (ROUTINE TESTING W REFLEX): HIV Screen 4th Generation wRfx: NONREACTIVE

## 2020-07-25 LAB — POTASSIUM: Potassium: 3.1 mmol/L — ABNORMAL LOW (ref 3.5–5.1)

## 2020-07-25 LAB — SARS CORONAVIRUS 2 (TAT 6-24 HRS): SARS Coronavirus 2: NEGATIVE

## 2020-07-25 LAB — LACTIC ACID, PLASMA: Lactic Acid, Venous: 1.7 mmol/L (ref 0.5–1.9)

## 2020-07-25 LAB — FOLATE: Folate: 3.3 ng/mL — ABNORMAL LOW (ref >5.9)

## 2020-07-25 MED ORDER — POTASSIUM PHOSPHATES 15 MMOLE/5ML IV SOLN
20.0000 mmol | Freq: Once | INTRAVENOUS | Status: AC
  Administered 2020-07-25: 20 mmol via INTRAVENOUS
  Filled 2020-07-25 (×2): qty 6.67

## 2020-07-25 MED ORDER — CALCIUM CARBONATE ANTACID 500 MG PO CHEW
400.0000 mg | CHEWABLE_TABLET | ORAL | Status: DC
  Administered 2020-07-25: 400 mg via ORAL
  Filled 2020-07-25 (×2): qty 2

## 2020-07-25 NOTE — Consult Note (Signed)
[facility] Face-to-Face Psychiatry Consult   Reason for Consult: Consult for 65 year old woman brought to the hospital after a neighbor saw her fall down.  Concern about depression and alcohol abuse Referring Physician: Allena Katz Patient Identification: Valerie Leonard MRN:  [id number] Principal Diagnosis: Acute delirium Diagnosis:  Principal Problem:   Acute delirium Active Problems:   Hypokalemia   Acute metabolic encephalopathy   Hypomagnesemia   Chronic alcohol abuse   Total Time spent with patient: 1 hour  Subjective:   Valerie Leonard is a 65 y.o. female patient admitted with "where is my phone?".  HPI: Chart reviewed patient seen.  Little past history available.  This 65 year old woman was brought to the hospital after EMS were called by neighbors who saw the patient reportedly fall down outside.  Patient has been confused to what extent we are a nether since coming into the hospital.  There were reports from intake that she drinks regularly consuming wine.  Apparently lives by herself.  Found the patient this afternoon asleep.  Woke up only after I capped her several times on the shoulder.  Remained confused.  Unable to engage in clinical conversation.  She is dysarthric as well and I had a very difficult time understanding her.  Affect flat.  Seems sluggish.  Labs reviewed.  Looks like significant liver problems.  Anemia.  Past Psychiatric History: No known past history that I can identify.  Risk to Self:   Risk to Others:   Prior Inpatient Therapy:   Prior Outpatient Therapy:    Past Medical History: History reviewed. No pertinent past medical history. History reviewed. No pertinent surgical history. Family History: No family history on file. Family Psychiatric  History: Unknown Social History:  Social History   Substance and Sexual Activity  Alcohol Use Yes   Comment: More than 4 glasses of wine, mixed drinks, beer daily.      Social History   Substance and Sexual Activity  Drug  Use Not Currently   Comment: former use of marijuana    Social History   Socioeconomic History  . Marital status: Single    Spouse name: Not on file  . Number of children: Not on file  . Years of education: Not on file  . Highest education level: Not on file  Occupational History  . Not on file  Tobacco Use  . Smoking status: Former Smoker    Types: Cigarettes, Cigars  . Smokeless tobacco: Never Used  Vaping Use  . Vaping Use: Never used  Substance and Sexual Activity  . Alcohol use: Yes    Comment: More than 4 glasses of wine, mixed drinks, beer daily.   . Drug use: Not Currently    Comment: former use of marijuana  . Sexual activity: Not on file  Other Topics Concern  . Not on file  Social History Narrative  . Not on file   Social Determinants of Health   Financial Resource Strain: Not on file  Food Insecurity: Not on file  Transportation Needs: Not on file  Physical Activity: Not on file  Stress: Not on file  Social Connections: Not on file   Additional Social History:    Allergies:  Not on File  Labs:  Results for orders placed or performed during the hospital encounter of 07/24/20 (from the past 48 hour(s))  Ethanol     Status: None   Collection Time: 07/24/20  3:21 PM  Result Value Ref Range   Alcohol, Ethyl (B) <10 <10 mg/dL  Comment: (NOTE) Lowest detectable limit for serum alcohol is 10 mg/dL.  For medical purposes only. Performed at [facility], [address] [place], [place] [address]   CBC     Status: Abnormal   Collection Time: 07/24/20  3:21 PM  Result Value Ref Range   WBC 11.8 (H) 4.0 - 10.5 K/uL   RBC 3.95 3.87 - 5.11 MIL/uL   Hemoglobin 14.1 12.0 - 15.0 g/dL   HCT 19.1 47.8 - 29.5 %   MCV 101.3 (H) 80.0 - 100.0 fL   MCH 35.7 (H) 26.0 - 34.0 pg   MCHC 35.3 30.0 - 36.0 g/dL   RDW 62.1 (H) 30.8 - 65.7 %   Platelets 470 (H) 150 - 400 K/uL   nRBC 0.2 0.0 - 0.2 %    Comment: Performed at [facility], [address] [place], [place] [address]  Comprehensive metabolic panel     Status: Abnormal   Collection Time: 07/24/20  3:21 PM  Result Value Ref Range   Sodium 132 (L) 135 - 145 mmol/L   Potassium 2.4 (LL) 3.5 - 5.1 mmol/L    Comment: CRITICAL RESULT CALLED TO, READ BACK BY AND VERIFIED WITH RYAN WHITE AT 1610 ON 07/24/20 BY SS    Chloride 85 (L) 98 - 111 mmol/L   CO2 28 22 - 32 mmol/L   Glucose, Bld 122 (H) 70 - 99 mg/dL    Comment: Glucose reference range applies only to samples taken after fasting for at least 8 hours.   BUN 9 8 - 23 mg/dL   Creatinine, Ser 2.95 0.44 - 1.00 mg/dL   Calcium 8.2 (L) 8.9 - 10.3 mg/dL   Total Protein 7.2 6.5 - 8.1 g/dL   Albumin 2.7 (L) 3.5 - 5.0 g/dL   AST 80 (H) 15 - 41 U/L   ALT 26 0 - 44 U/L   Alkaline Phosphatase 122 38 - 126 U/L   Total Bilirubin 5.5 (H) 0.3 - 1.2 mg/dL   GFR, Estimated >28 >41 mL/min    Comment: (NOTE) Calculated using the CKD-EPI Creatinine Equation (2021)    Anion gap 19 (H) 5 - 15    Comment: Performed at [facility], [address] [place], [place] [address]  Magnesium     Status: Abnormal   Collection Time: 07/24/20  3:35 PM  Result Value Ref Range   Magnesium 1.6 (L) 1.7 - 2.4 mg/dL    Comment: Performed at [facility], [address] [place], [place] [address]  Vitamin B12     Status: None   Collection Time: 07/24/20  3:35 PM  Result Value Ref Range   Vitamin B-12 760 180 - 914 pg/mL    Comment: (NOTE) This assay is not validated for testing neonatal or myeloproliferative syndrome specimens for Vitamin B12 levels. Performed at [facility], [address], [place], [place] [address]   Urinalysis, Complete w Microscopic     Status: Abnormal   Collection Time: 07/24/20  4:47 PM  Result Value Ref Range   Color, Urine AMBER (A) YELLOW    Comment: BIOCHEMICALS MAY BE AFFECTED BY COLOR   APPearance HAZY (A) CLEAR   Specific Gravity, Urine 1.015 1.005 - 1.030   pH 5.0 5.0 - 8.0    Glucose, UA NEGATIVE NEGATIVE mg/dL   Hgb urine dipstick NEGATIVE NEGATIVE   Bilirubin Urine SMALL (A) NEGATIVE   Ketones, ur 20 (A) NEGATIVE mg/dL   Protein, ur NEGATIVE NEGATIVE mg/dL  Nitrite NEGATIVE NEGATIVE   Leukocytes,Ua NEGATIVE NEGATIVE   RBC / HPF 0-5 0 - 5 RBC/hpf   WBC, UA 0-5 0 - 5 WBC/hpf   Bacteria, UA RARE (A) NONE SEEN   Squamous Epithelial / LPF 0-5 0 - 5   Mucus PRESENT    Hyaline Casts, UA PRESENT     Comment: Performed at [facility], [address] [place], [place] [address]  Urine Drug Screen, Qualitative ([facility] only)     Status: None   Collection Time: 07/24/20  4:47 PM  Result Value Ref Range   Tricyclic, Ur Screen NONE DETECTED NONE DETECTED   Amphetamines, Ur Screen NONE DETECTED NONE DETECTED   MDMA (Ecstasy)Ur Screen NONE DETECTED NONE DETECTED   Cocaine Metabolite,Ur [place] NONE DETECTED NONE DETECTED   Opiate, Ur Screen NONE DETECTED NONE DETECTED   Phencyclidine (PCP) Ur S NONE DETECTED NONE DETECTED   Cannabinoid 50 Ng, Ur [place] NONE DETECTED NONE DETECTED   Barbiturates, Ur Screen NONE DETECTED NONE DETECTED   Benzodiazepine, Ur Scrn NONE DETECTED NONE DETECTED   Methadone Scn, Ur NONE DETECTED NONE DETECTED    Comment: (NOTE) Tricyclics + metabolites, urine    Cutoff 1000 ng/mL Amphetamines + metabolites, urine  Cutoff 1000 ng/mL MDMA (Ecstasy), urine              Cutoff 500 ng/mL Cocaine Metabolite, urine          Cutoff 300 ng/mL Opiate + metabolites, urine        Cutoff 300 ng/mL Phencyclidine (PCP), urine         Cutoff 25 ng/mL Cannabinoid, urine                 Cutoff 50 ng/mL Barbiturates + metabolites, urine  Cutoff 200 ng/mL Benzodiazepine, urine              Cutoff 200 ng/mL Methadone, urine                   Cutoff 300 ng/mL  The urine drug screen provides only a preliminary, unconfirmed analytical test result and should not be used for non-medical purposes. Clinical consideration and professional judgment should be applied  to any positive drug screen result due to possible interfering substances. A more specific alternate chemical method must be used in order to obtain a confirmed analytical result. Gas chromatography / mass spectrometry (GC/MS) is the preferred confirm atory method. Performed at [facility], [address] [place], [place] [address]   SARS CORONAVIRUS 2 (TAT 6-24 HRS) Nasopharyngeal Nasopharyngeal Swab     Status: None   Collection Time: 07/24/20  4:47 PM   Specimen: Nasopharyngeal Swab  Result Value Ref Range   SARS Coronavirus 2 NEGATIVE NEGATIVE    Comment: (NOTE) SARS-CoV-2 target nucleic acids are NOT DETECTED.  The SARS-CoV-2 RNA is generally detectable in upper and lower respiratory specimens during the acute phase of infection. Negative results do not preclude SARS-CoV-2 infection, do not rule out co-infections with other pathogens, and should not be used as the sole basis for treatment or other patient management decisions. Negative results must be combined with clinical observations, patient history, and epidemiological information. The expected result is Negative.  Fact Sheet for Patients: HairSlick.nohttps://www.fda.gov/media/138098/download  Fact Sheet for Healthcare Providers: quierodirigir.comhttps://www.fda.gov/media/138095/download  This test is not yet approved or cleared by the [place] FDA and  has been authorized for detection and/or diagnosis of SARS-CoV-2 by FDA under an Emergency Use Authorization (EUA). This EUA will  remain  in effect (meaning this test can be used) for the duration of the COVID-19 declaration under Se ction 564(b)(1) of the Act, 21 U.S.C. section 360bbb-3(b)(1), unless the authorization is terminated or revoked sooner.  Performed at [facility], [address] [place], [place] [address]   Protime-INR     Status: Abnormal   Collection Time: 07/24/20  4:47 PM  Result Value Ref Range   Prothrombin Time 16.4 (H) 11.4 - 15.2 seconds   INR  1.3 (H) 0.8 - 1.2    Comment: (NOTE) INR goal varies based on device and disease states. Performed at [facility], [address] [place], [place] [address]   Lactic acid, plasma     Status: Abnormal   Collection Time: 07/24/20  5:56 PM  Result Value Ref Range   Lactic Acid, Venous 3.4 (HH) 0.5 - 1.9 mmol/L    Comment: CRITICAL RESULT CALLED TO, READ BACK BY AND VERIFIED WITH RYAN WHITE AT 1827 ON 07/24/20 BY SS Performed at [facility], [address] [place], [place] [address]   Ammonia     Status: Abnormal   Collection Time: 07/24/20  5:56 PM  Result Value Ref Range   Ammonia <9 (L) 9 - 35 umol/L    Comment: Performed at [facility], [address] [place], [place] [address]  Blood culture (routine x 2)     Status: None (Preliminary result)   Collection Time: 07/24/20  7:12 PM   Specimen: BLOOD  Result Value Ref Range   Specimen Description BLOOD RIGHT ANTECUBITAL    Special Requests      BOTTLES DRAWN AEROBIC AND ANAEROBIC Blood Culture adequate volume   Culture      NO GROWTH < 12 HOURS Performed at [facility], [address], [place], [place] [address]    Report Status PENDING   Blood culture (routine x 2)     Status: None (Preliminary result)   Collection Time: 07/24/20  7:12 PM   Specimen: BLOOD  Result Value Ref Range   Specimen Description BLOOD LEFT ANTECUBITAL    Special Requests      BOTTLES DRAWN AEROBIC AND ANAEROBIC Blood Culture adequate volume   Culture      NO GROWTH < 12 HOURS Performed at [facility], [address], [place], [place] [address]    Report Status PENDING   HIV Antibody (routine testing w rflx)     Status: None   Collection Time: 07/24/20  9:31 PM  Result Value Ref Range   HIV Screen 4th Generation wRfx Non Reactive Non Reactive    Comment: Performed at [facility], [address], [place], [place] [address]  Potassium     Status: Abnormal   Collection Time: 07/24/20   9:31 PM  Result Value Ref Range   Potassium 3.1 (L) 3.5 - 5.1 mmol/L    Comment: HEMOLYSIS AT THIS LEVEL MAY AFFECT RESULT Performed at [facility], [address] [place], [place] [address]   Magnesium     Status: None   Collection Time: 07/24/20  9:31 PM  Result Value Ref Range   Magnesium 1.9 1.7 - 2.4 mg/dL    Comment: Performed at [facility], [address] [place], [place] [address]  Phosphorus     Status: Abnormal   Collection Time: 07/24/20  9:31 PM  Result Value Ref Range   Phosphorus 1.7 (L) 2.5 - 4.6 mg/dL    Comment: Performed  at [facility], [address] [place], [place] [address]  CBC     Status: Abnormal   Collection Time: 07/24/20  9:31 PM  Result Value Ref Range   WBC 12.8 (H) 4.0 - 10.5 K/uL   RBC 2.98 (L) 3.87 - 5.11 MIL/uL   Hemoglobin 11.0 (L) 12.0 - 15.0 g/dL   HCT 57.0 (L) 17.7 - 93.9 %   MCV 101.7 (H) 80.0 - 100.0 fL   MCH 36.9 (H) 26.0 - 34.0 pg   MCHC 36.3 (H) 30.0 - 36.0 g/dL   RDW 03.0 (H) 09.2 - 33.0 %   Platelets 328 150 - 400 K/uL   nRBC 0.2 0.0 - 0.2 %    Comment: Performed at [facility], [address], [place], [place] [address]  Folate, serum, performed at [facility] lab     Status: Abnormal   Collection Time: 07/25/20  5:22 AM  Result Value Ref Range   Folate 3.3 (L) >5.9 ng/mL    Comment: Performed at [facility], [address] [place], [place] [address]  Comprehensive metabolic panel     Status: Abnormal   Collection Time: 07/25/20  5:56 AM  Result Value Ref Range   Sodium 137 135 - 145 mmol/L   Potassium 3.4 (L) 3.5 - 5.1 mmol/L   Chloride 101 98 - 111 mmol/L   CO2 24 22 - 32 mmol/L   Glucose, Bld 80 70 - 99 mg/dL    Comment: Glucose reference range applies only to samples taken after fasting for at least 8 hours.   BUN 5 (L) 8 - 23 mg/dL   Creatinine, Ser 4.56 (L) 0.44 - 1.00 mg/dL   Calcium 6.8 (L) 8.9 - 10.3 mg/dL   Total Protein 5.4 (L) 6.5 - 8.1 g/dL   Albumin  2.0 (L) 3.5 - 5.0 g/dL   AST 49 (H) 15 - 41 U/L   ALT 20 0 - 44 U/L   Alkaline Phosphatase 84 38 - 126 U/L   Total Bilirubin 3.3 (H) 0.3 - 1.2 mg/dL   GFR, Estimated >25 >63 mL/min    Comment: (NOTE) Calculated using the CKD-EPI Creatinine Equation (2021)    Anion gap 12 5 - 15    Comment: Performed at [facility], [address] [place], [place] [address]  CBC     Status: Abnormal   Collection Time: 07/25/20  5:56 AM  Result Value Ref Range   WBC 9.7 4.0 - 10.5 K/uL   RBC 3.12 (L) 3.87 - 5.11 MIL/uL   Hemoglobin 11.2 (L) 12.0 - 15.0 g/dL   HCT 42.8 (L) 76.8 - 11.5 %   MCV 104.5 (H) 80.0 - 100.0 fL   MCH 35.9 (H) 26.0 - 34.0 pg   MCHC 34.4 30.0 - 36.0 g/dL   RDW 72.6 (H) 20.3 - 55.9 %   Platelets 295 150 - 400 K/uL   nRBC 0.0 0.0 - 0.2 %    Comment: Performed at [facility], [address] [place], [place] [address]  Lactic acid, plasma     Status: None   Collection Time: 07/25/20  5:56 AM  Result Value Ref Range   Lactic Acid, Venous 1.7 0.5 - 1.9 mmol/L    Comment: Performed at [facility], [address], [place], [place] [address]    Current Facility-Administered Medications  Medication Dose Route Frequency Provider Last Rate Last Admin  . calcium carbonate (TUMS - dosed in mg elemental calcium) chewable tablet 400  mg of elemental calcium  400 mg of elemental calcium Oral Q4H Enedina Finner, MD      . cefTRIAXone (ROCEPHIN) 1 g in sodium chloride 0.9 % 100 mL IVPB  1 g Intravenous Q24H Willy Eddy, MD   Stopped at 07/24/20 2027  . enoxaparin (LOVENOX) injection 40 mg  40 mg Subcutaneous Q24H Acheampong, Genice Rouge, MD   40 mg at 07/24/20 2130  . folic acid (FOLVITE) tablet 1 mg  1 mg Oral Daily Acheampong, Genice Rouge, MD   1 mg at 07/25/20 1014  . ibuprofen (ADVIL) tablet 400 mg  400 mg Oral Q6H PRN Acheampong, Genice Rouge, MD      . LORazepam (ATIVAN) tablet 1-4 mg  1-4 mg Oral Q1H PRN Acheampong, Genice Rouge, MD   1 mg at 07/24/20 2038   Or  .  LORazepam (ATIVAN) injection 1-4 mg  1-4 mg Intravenous Q1H PRN Lilia Pro, MD   3 mg at 07/25/20 0033  . multivitamin with minerals tablet 1 tablet  1 tablet Oral Daily Acheampong, Genice Rouge, MD   1 tablet at 07/25/20 1014  . ondansetron (ZOFRAN) tablet 4 mg  4 mg Oral Q6H PRN Acheampong, Genice Rouge, MD       Or  . ondansetron [facility]) injection 4 mg  4 mg Intravenous Q6H PRN Lilia Pro, MD   4 mg at 07/24/20 2112  . potassium PHOSPHATE 20 mmol in dextrose 5 % 500 mL infusion  20 mmol Intravenous Once Enedina Finner, MD 84 mL/hr at 07/25/20 1333 20 mmol at 07/25/20 1333  . senna-docusate (Senokot-S) tablet 1 tablet  1 tablet Oral QHS PRN Acheampong, Genice Rouge, MD      . thiamine tablet 100 mg  100 mg Oral Daily Acheampong, Genice Rouge, MD   100 mg at 07/25/20 1014    Musculoskeletal: Strength & Muscle Tone: decreased Gait & Station: unable to stand Patient leans: N/A            Psychiatric Specialty Exam:  Presentation  General Appearance: No data recorded Eye Contact:No data recorded Speech:No data recorded Speech Volume:No data recorded Handedness:No data recorded  Mood and Affect  Mood:No data recorded Affect:No data recorded  Thought Process  Thought Processes:No data recorded Descriptions of Associations:No data recorded Orientation:No data recorded Thought Content:No data recorded History of Schizophrenia/Schizoaffective disorder:No data recorded Duration of Psychotic Symptoms:No data recorded Hallucinations:No data recorded Ideas of Reference:No data recorded Suicidal Thoughts:No data recorded Homicidal Thoughts:No data recorded  Sensorium  Memory:No data recorded Judgment:No data recorded Insight:No data recorded  Executive Functions  Concentration:No data recorded Attention Span:No data recorded Recall:No data recorded Fund of Knowledge:No data recorded Language:No data recorded  Psychomotor Activity  Psychomotor Activity:No data  recorded  Assets  Assets:No data recorded  Sleep  Sleep:No data recorded  Physical Exam: Physical Exam Vitals and nursing note reviewed.  HENT:     Head: Normocephalic and atraumatic.     Mouth/Throat:     Pharynx: Oropharynx is clear.  Eyes:     Pupils: Pupils are equal, round, and reactive to light.  Cardiovascular:     Rate and Rhythm: Normal rate and regular rhythm.  Pulmonary:     Effort: Pulmonary effort is normal.     Breath sounds: Normal breath sounds.  Abdominal:     General: Abdomen is flat.     Palpations: Abdomen is soft.  Musculoskeletal:        General: Normal range of motion.  Skin:  General: Skin is warm and dry.       Neurological:     General: No focal deficit present.     Mental Status: Mental status is at baseline.  Psychiatric:        Attention and Perception: She is inattentive.        Mood and Affect: Mood normal. Affect is blunt.        Speech: She is noncommunicative. Speech is slurred.        Behavior: Behavior is withdrawn.        Cognition and Memory: Cognition is impaired. Memory is impaired.    Review of Systems  Unable to perform ROS: Mental status change   Blood pressure 93/82, pulse 93, temperature (!) 97.4 F (36.3 C), resp. rate 14, height  (1.549 m), weight 59 kg, SpO2 100 %. Body mass index is 24.56 kg/m.  Treatment Plan Summary: Plan 65 year old woman came into the hospital after falling outside.  Alcohol level negative but signs of liver dysfunction and reports of daily drinking and marijuana use.  Patient is currently confused and somewhat delirious.  Unable to give more history.  Certainly could be depressed but very difficult to tell in this circumstance.  Patient seems to probably be at significant risk of developing delirium tremens.  I agree with replacing electrolytes and monitoring behavior for now.  I will come back by and try and talk with her again tomorrow.  Disposition: We will continue to  follow-up.  Mordecai Rasmussen, MD 07/25/2020 6:25 PM

## 2020-07-25 NOTE — TOC Progression Note (Signed)
Transition of Care [facility]) - Progression Note    Patient Details  Name: Valerie Leonard MRN: [id number] Date of Birth: 1955-05-17  Transition of Care [facility]) CM/SW [place], RN Phone Number: 07/25/2020, 3:58 PM  Clinical Narrative:    Met with the patient, the sister and the mother at the bedside I provided resources for ETOH and substance abuse I explained to the mother and the sister  that they can not force the patient to go to an alcohol abuse program,  I explained that patient's have choices, even when they make bad choices, the only time a patient would not be legally allowed to make their own choices is if they are deemed incompetent. The mother and sister stated understanding. This TOC will continue to monitor for additional needs during the hospital admission.         Expected Discharge Plan and Services                                                 Social Determinants of Health (SDOH) Interventions    Readmission Risk Interventions No flowsheet data found.

## 2020-07-25 NOTE — Progress Notes (Signed)
PT Cancellation Note  Patient Details Name: Valerie Leonard MRN: [id number] DOB: [birth year]   Cancelled Treatment:    Reason Eval/Treat Not Completed: Fatigue/lethargy limiting ability to participate. Patient sleeping soundly, family in room. Will re-attempt evaluation later today.     Tinna Kolker 07/25/2020, 11:52 AM

## 2020-07-25 NOTE — Progress Notes (Signed)
[facility]  - [place] [facility]   PATIENT NAME: Valerie Leonard    MR#:  [id number]  DATE OF BIRTH:  1955/04/26  SUBJECTIVE:  patient very lethargic fast asleep. No sedating meds given. Mother and sister in the room. Patient ate some breakfast in the morning REVIEW OF SYSTEMS:   Review of Systems  Unable to perform ROS: Mental status change   Tolerating Diet: Tolerating PT:   DRUG ALLERGIES:  Not on File  VITALS:  Blood pressure 122/74, pulse 71, temperature (!) 97.4 F (36.3 C), temperature source Oral, resp. rate 18, height 5\' 1"  (1.549 m), weight 59 kg, SpO2 100 %.  PHYSICAL EXAMINATION:   Physical Exam  GENERAL:  65 y.o.-year-old patient lying in the bed with no acute distress.  LUNGS: Normal breath sounds bilaterally, no wheezing, rales, rhonchi. No use of accessory muscles of respiration.  CARDIOVASCULAR: S1, S2 normal. No murmurs, rubs, or gallops.  ABDOMEN: Soft, nontender, nondistended. Bowel sounds present. No organomegaly or mass.  EXTREMITIES: No cyanosis, clubbing or edema b/l.    NEUROLOGIC: moves extremities spontaneously  PSYCHIATRIC:  patient is lethargic SKIN: No obvious rash, lesion, or ulcer.   LABORATORY PANEL:  CBC Recent Labs  Lab 07/25/20 0556  WBC 9.7  HGB 11.2*  HCT 32.6*  PLT 295    Chemistries  Recent Labs  Lab 07/24/20 2131 07/25/20 0556  NA  --  137  K 3.1* 3.4*  CL  --  101  CO2  --  24  GLUCOSE  --  80  BUN  --  5*  CREATININE  --  0.35*  CALCIUM  --  6.8*  MG 1.9  --   AST  --  49*  ALT  --  20  ALKPHOS  --  84  BILITOT  --  3.3*   Cardiac Enzymes No results for input(s): TROPONINI in the last 168 hours. RADIOLOGY:  CT Head Wo Contrast  Result Date: 07/24/2020 CLINICAL DATA:  07/26/2020 out of bed. EXAM: CT HEAD WITHOUT CONTRAST CT CERVICAL SPINE WITHOUT CONTRAST TECHNIQUE: Multidetector CT imaging of the head and cervical spine was performed following the standard protocol without intravenous  contrast. Multiplanar CT image reconstructions of the cervical spine were also generated. COMPARISON:  None. FINDINGS: CT HEAD FINDINGS Brain: No evidence of acute infarction, hemorrhage, hydrocephalus, extra-axial collection or mass lesion/mass effect. Mild generalized cerebral atrophy. Scattered moderate periventricular and subcortical white matter hypodensities are nonspecific, but favored to reflect chronic microvascular ischemic changes. Vascular: Calcified atherosclerosis at the skullbase. No hyperdense vessel. Skull: Normal. Negative for fracture or focal lesion. Sinuses/Orbits: No acute finding. Other: None. CT CERVICAL SPINE FINDINGS Alignment: No traumatic malalignment. Reversal of the normal cervical lordosis. Skull base and vertebrae: No acute fracture. No primary bone lesion or focal pathologic process. Soft tissues and spinal canal: No prevertebral fluid or swelling. No visible canal hematoma. Disc levels: Mild C4-C5, moderate C5-C6, and severe C6-C7 disc height loss with associated endplate spurring. Upper chest: Negative. Other: None. IMPRESSION: 1. No acute intracranial abnormality. Mild cerebral atrophy and moderate chronic microvascular ischemic changes. 2. No acute cervical spine fracture or traumatic malalignment. Multilevel cervical spondylosis, severe at C6-C7. Electronically Signed   By: Larey Seat M.D.   On: 07/24/2020 16:09   CT Cervical Spine Wo Contrast  Result Date: 07/24/2020 CLINICAL DATA:  07/26/2020 out of bed. EXAM: CT HEAD WITHOUT CONTRAST CT CERVICAL SPINE WITHOUT CONTRAST TECHNIQUE: Multidetector CT imaging of the head and cervical spine was performed  following the standard protocol without intravenous contrast. Multiplanar CT image reconstructions of the cervical spine were also generated. COMPARISON:  None. FINDINGS: CT HEAD FINDINGS Brain: No evidence of acute infarction, hemorrhage, hydrocephalus, extra-axial collection or mass lesion/mass effect. Mild generalized cerebral  atrophy. Scattered moderate periventricular and subcortical white matter hypodensities are nonspecific, but favored to reflect chronic microvascular ischemic changes. Vascular: Calcified atherosclerosis at the skullbase. No hyperdense vessel. Skull: Normal. Negative for fracture or focal lesion. Sinuses/Orbits: No acute finding. Other: None. CT CERVICAL SPINE FINDINGS Alignment: No traumatic malalignment. Reversal of the normal cervical lordosis. Skull base and vertebrae: No acute fracture. No primary bone lesion or focal pathologic process. Soft tissues and spinal canal: No prevertebral fluid or swelling. No visible canal hematoma. Disc levels: Mild C4-C5, moderate C5-C6, and severe C6-C7 disc height loss with associated endplate spurring. Upper chest: Negative. Other: None. IMPRESSION: 1. No acute intracranial abnormality. Mild cerebral atrophy and moderate chronic microvascular ischemic changes. 2. No acute cervical spine fracture or traumatic malalignment. Multilevel cervical spondylosis, severe at C6-C7. Electronically Signed   By: Obie Dredge M.D.   On: 07/24/2020 16:09   CT ABDOMEN PELVIS W CONTRAST  Result Date: 07/24/2020 CLINICAL DATA:  Fall EXAM: CT ABDOMEN AND PELVIS WITH CONTRAST TECHNIQUE: Multidetector CT imaging of the abdomen and pelvis was performed using the standard protocol following bolus administration of intravenous contrast. CONTRAST:  OMNIPAQUE IOHEXOL 300 MG/ML  SOLN COMPARISON:  None. FINDINGS: Lower chest: No acute abnormality. Hepatobiliary: Diffuse heterogeneous low-density throughout the liver, likely fatty infiltration. No evidence of hepatic injury or perihepatic hematoma. Gallbladder unremarkable. Pancreas: No focal abnormality or ductal dilatation. Spleen: No splenic injury or perisplenic hematoma. Adrenals/Urinary Tract: No adrenal hemorrhage or renal injury identified. Bladder is unremarkable. Stomach/Bowel: Stomach, large and small bowel grossly unremarkable.  Vascular/Lymphatic: Aortic atherosclerosis. No evidence of aneurysm or adenopathy. Reproductive: Uterus and adnexa unremarkable.  No mass. Other: No free fluid or free air. Musculoskeletal: No acute bony abnormality. IMPRESSION: No acute findings or evidence of significant traumatic injury in the abdomen or pelvis. Hepatic steatosis. Aortic atherosclerosis. Electronically Signed   By: Charlett Nose M.D.   On: 07/24/2020 19:37   DG Chest Portable 1 View  Result Date: 07/24/2020 CLINICAL DATA:  Recent fall EXAM: PORTABLE CHEST 1 VIEW COMPARISON:  None. FINDINGS: The heart size and mediastinal contours are within normal limits. Both lungs are clear. The visualized skeletal structures are unremarkable. IMPRESSION: No acute abnormality noted. Electronically Signed   By: Alcide Clever M.D.   On: 07/24/2020 16:54   US ABDOMEN LIMITED RUQ (LIVER/GB)  Result Date: 07/24/2020 CLINICAL DATA:  Elevated bilirubin EXAM: ULTRASOUND ABDOMEN LIMITED RIGHT UPPER QUADRANT COMPARISON:  None. FINDINGS: Gallbladder: Gallbladder is well distended with considerable gallbladder sludge within. No cholelithiasis is noted. No wall thickening is seen. Common bile duct: Diameter: 4 mm. Liver: Diffusely increased in echogenicity consistent with fatty infiltration. No mass lesion is noted. Portal vein is patent on color Doppler imaging with normal direction of blood flow towards the liver. Other: None. IMPRESSION: Gallbladder sludge without complicating factors. No cholelithiasis is seen. Fatty liver. Electronically Signed   By: Alcide Clever M.D.   On: 07/24/2020 17:54   ASSESSMENT AND PLAN:  Jadasia Haws is an 65 y.o. female with no significant past medical history except for chronic alcohol abuse and remote history of tobacco smoking, quit several years ago. Patient was brought in by family after she had an accident fall at home. She has history of chronic alcoholism.  Mechanical fall acute metabolic encephalopathy -- Most likely  accidental without any associated encephalopathy. --  CT head and neck were negative for any acute abnormality.   --Incidental finding of multilevel cervical spondylosis noted.   --Patient is asymptomatic.  Electrolyte abnormalities due to poor PO intake acute hypokalemia, hypophosphatemia,Hypomagnesemia -Most likely due to poor oral intake.  No obvious EKG abnormalities at this time.   -- pharmacy to replace electrolytes  Chronic  Alcohol dependence and abuse:  acute alcoholic hepatitis with hepatic steatosis  --alcohol level were normal on admission.   --No evidence of alcohol withdrawal at this time.  -- Alcohol withdrawal protocol will be instituted with lorazepam as needed.  --Thiamine and folate replacement therapy will be scheduled.   Alcoholic liver disease: --Elevated AST and total bilirubin noted.  -- CT scan shows hepatic steatosis without any acute intra-abdominal findings.  Elevated lactic acidosis: 3.4--1.7  Patient will be adequately volume repleted.   Depression --mother reports patient has been starving herself and has lost interest in her social life. Has had made comments regarding ending her life to be with her husband -- psychiatry consultation with Dr. Toni Amend   Procedures: Family communication :mother and sister Consults :psych CODE STATUS: full DVT Prophylaxis :lovenox Level of care: Med-Surg Status is: Observation  The patient remains OBS appropriate and will d/c before 2 midnights.  Dispo: The patient is from: Home              Anticipated d/c is to: Home              Patient currently is not medically stable to d/c.   Difficult to place patient No        TOTAL TIME TAKING CARE OF THIS PATIENT: 25 minutes.  >50% time spent on counselling and coordination of care  Note: This dictation was prepared with Dragon dictation along with smaller phrase technology. Any transcriptional errors that result from this process are unintentional.  Enedina Finner M.D    [facility]   CC: Primary care physician; Pcp, NoPatient ID: Valerie Leonard, female   DOB: 1955/05/12, 65 y.o.   MRN: [id number]

## 2020-07-25 NOTE — ED Notes (Signed)
Report given to new accepting RN. No questions posed to this RN. Pt changed and placed into clean gown and brief before ED departure.

## 2020-07-26 DIAGNOSIS — F322 Major depressive disorder, single episode, severe without psychotic features: Secondary | ICD-10-CM

## 2020-07-26 DIAGNOSIS — F32A Depression, unspecified: Secondary | ICD-10-CM

## 2020-07-26 LAB — BASIC METABOLIC PANEL
Anion gap: 9 (ref 5–15)
BUN: <5 mg/dL — ABNORMAL LOW (ref 8–23)
CO2: 25 mmol/L (ref 22–32)
Calcium: 7.6 mg/dL — ABNORMAL LOW (ref 8.9–10.3)
Chloride: 100 mmol/L (ref 98–111)
Creatinine, Ser: 0.4 mg/dL — ABNORMAL LOW (ref 0.44–1.00)
GFR, Estimated: >60 mL/min (ref >60)
Glucose, Bld: 74 mg/dL (ref 70–99)
Potassium: 3.9 mmol/L (ref 3.5–5.1)
Sodium: 134 mmol/L — ABNORMAL LOW (ref 135–145)

## 2020-07-26 LAB — MAGNESIUM
Magnesium: 1.7 mg/dL (ref 1.7–2.4)
Magnesium: 1.6 mg/dL — ABNORMAL LOW (ref 1.7–2.4)

## 2020-07-26 LAB — GLUCOSE, CAPILLARY: Glucose-Capillary: 112 mg/dL — ABNORMAL HIGH (ref 70–99)

## 2020-07-26 LAB — AMMONIA: Ammonia: 39 umol/L — ABNORMAL HIGH (ref 9–35)

## 2020-07-26 LAB — PHOSPHORUS: Phosphorus: 2.6 mg/dL (ref 2.5–4.6)

## 2020-07-26 MED ORDER — LORAZEPAM 2 MG/ML IJ SOLN: 0.5000 mg | Freq: Once | INTRAMUSCULAR | Status: DC

## 2020-07-26 MED ORDER — CITALOPRAM HYDROBROMIDE 20 MG PO TABS
20.0000 mg | ORAL_TABLET | Freq: Every day | ORAL | Status: DC
  Administered 2020-07-26 – 2020-07-28 (×3): 20 mg via ORAL
  Filled 2020-07-26 (×3): qty 1

## 2020-07-26 MED ORDER — ENSURE ENLIVE PO LIQD
237.0000 mL | Freq: Two times a day (BID) | ORAL | Status: DC
  Administered 2020-07-26 – 2020-07-28 (×5): 237 mL via ORAL

## 2020-07-26 MED ORDER — LORAZEPAM 2 MG/ML IJ SOLN: 1.0000 mg | Freq: Once | INTRAMUSCULAR | Status: DC

## 2020-07-26 MED ORDER — LORAZEPAM 0.5 MG PO TABS: 0.5000 mg | ORAL_TABLET | Freq: Four times a day (QID) | ORAL | Status: DC | PRN

## 2020-07-26 MED ORDER — LORAZEPAM 0.5 MG PO TABS: 0.5000 mg | ORAL_TABLET | Freq: Two times a day (BID) | ORAL | Status: DC | PRN

## 2020-07-26 MED ORDER — MAGNESIUM SULFATE 2 GM/50ML IV SOLN
2.0000 g | Freq: Once | INTRAVENOUS | Status: AC
  Administered 2020-07-26: 2 g via INTRAVENOUS
  Filled 2020-07-26: qty 50

## 2020-07-26 MED ORDER — THIAMINE HCL 100 MG PO TABS
50.0000 mg | ORAL_TABLET | Freq: Every day | ORAL | Status: DC
  Administered 2020-07-27 – 2020-07-28 (×2): 50 mg via ORAL
  Filled 2020-07-26 (×2): qty 1

## 2020-07-26 MED ORDER — ACETAMINOPHEN 325 MG PO TABS: 650.0000 mg | ORAL_TABLET | Freq: Four times a day (QID) | ORAL | Status: DC | PRN

## 2020-07-26 NOTE — Progress Notes (Addendum)
Initial Nutrition Assessment  DOCUMENTATION CODES:  Not applicable  INTERVENTION:   Continue current diet as ordered  Ensure Enlive po BID, each supplement provides 350 kcal and 20 grams of protein  Continue MVI with minerals, thiamine, and folic acid  NUTRITION DIAGNOSIS:  Inadequate oral intake related to lethargy/confusion as evidenced by meal completion < 50%.  GOAL:  Patient will meet greater than or equal to 90% of their needs  MONITOR:  Supplement acceptance,PO intake  REASON FOR ASSESSMENT:  Consult,Malnutrition Screening Tool    ASSESSMENT:  Pt admitted with AMS after a fall outside her house earlier in the day. Neighbors called EMS. Pt reported daily consumption of EtOH and remained lethargic and confused after admission. Found to have altered electrolytes and elevated LFTs. Family reports depressive symptoms and poor intake at home. PMH includes EtOH abuse.  Pt remains confused, disoriented, and weak this AM. No meals recorded. Spoke with RN and NT about intake. Minimal oral intake (few bites of breakfast) but did drink some juice. Vitamin regimen in place for hx of EtOH abuse and pt being monitored for withdrawal symptoms. Labs more stable after replacement administered yesterday. Psych consulting but unable to conduct interview yesterday due to AMS. At baseline, pt lives at home alone.     Relevant Scheduled Meds: . folic acid  1 mg Oral Daily  . multivitamin with minerals  1 tablet Oral Daily  . thiamine  100 mg Oral Daily   Relevant Continuous Infusions: . cefTRIAXone (ROCEPHIN)  IV Stopped (07/25/20 2107)   Relevant PRN Meds: ondansetron, senna-docusate  Labs reviewed:  Na 134  NUTRITION - FOCUSED PHYSICAL EXAM: Defer to in-person assessment  Diet Order:   Diet Order            Diet regular Room service appropriate? Yes; Fluid consistency: Thin  Diet effective now                 EDUCATION NEEDS:  Not appropriate for education at this  time  Skin:  Skin Assessment: Reviewed RN Assessment  Last BM:  PTA  Height:  Ht Readings from Last 1 Encounters:  07/24/20 5\' 1"  (1.549 m)    Weight:  Wt Readings from Last 1 Encounters:  07/24/20 59 kg    Ideal Body Weight:  45.8 kg  BMI:  Body mass index is 24.56 kg/m.  Estimated Nutritional Needs:   Kcal:  1500-1700 kcal  Protein:  75-85g  Fluid:  > 1500 mL/d   07/26/20, RD, LDN Clinical Dietitian Pager on Amion

## 2020-07-26 NOTE — Progress Notes (Signed)
   07/26/20 1148  Clinical Encounter Type  Visited With Patient and family together  Visit Type Follow-up  Referral From Nurse  Consult/Referral To Chaplain  Spiritual Encounters  Spiritual Needs Literature  Chaplain Tahja Liao responded to a page for room 1A-154A, Pt Mrs. Valerie Leonard.  Pt daughter and other family member are by the bedside. I provided one AD education. Pt's daughter said they will wait until tomorrow when another family member come and assist them in completing the pamphlet.

## 2020-07-26 NOTE — Consult Note (Signed)
[facility]   Reason for Consult: Consult for this 65 year old woman brought to the hospital after having a fall in public with altered mental status.  Concern about depression and alcohol use Referring Physician: Allena KatzPatel Patient Identification: Valerie Leonard MRN:  [id number] Principal Diagnosis: Severe major depression, single episode, without psychotic features (HCC) Diagnosis:  Principal Problem:   Severe major depression, single episode, without psychotic features (HCC) Active Problems:   Hypokalemia   Acute metabolic encephalopathy   Hypomagnesemia   Chronic alcohol abuse   Acute delirium   Depression   Total Time spent with patient: 1 hour  Subjective:   Valerie Leonard is a 65 y.o. female patient admitted with "I guess my neighbor called".  HPI: Patient seen chart reviewed.  History also obtained from talking with her parents who were visiting.  65 year old woman brought to the hospital after a neighbor witnessed her have a fall outside.  Patient with abnormal labs and altered mental status.  Now awake and conversant.  Patient has a history reported of heavy alcohol use.  Concern raised about depression.  The patient herself seems very sluggish and slow to respond to questions or engage in conversation.  Some of this may be depression and some of it may be cognitive impairment some of it may be intentionally avoiding the questions.  Family reports that ever since her husband died 10 years ago she has been in bad shape.  Does not do a good job taking care of herself.  Gradually has gone downhill with self care and activity.  Since this past November things have been much worse.  They suspect she is not eating normal meals at all at home.  Patient is known to drink quite a bit.  Alcohol level not detected when she came in however.  I ask her about how much she drank and no matter how many times I ask she refused to quantify it.  Patient says that it never occurred to her  that her drinking might be a problem.  Family reports that her mood and affect are grossly different than they used to be.  She has very poor hygiene and self-care.  Acknowledges feeling tired will not discuss necessarily being depressed.  Admits to some disruption in sleep and disruption in appetite.  Denies suicidal ideation.  Denies hallucinations.  She is alert and oriented to the most basic elements of the situation but clearly has some impairment in her memory and concentration.  Could not follow specific commands without them being repeated multiple times and being physically directed and how to do them.  Patient lives by herself.  Family actively concern for her wellbeing however.  Past Psychiatric History: Evidently no history of any psychiatric treatment.  No hospitalization and no suicide attempts no known medication.  Sounds like she basically avoids all self-care in general.  No history of any substance abuse treatment.  Patient does not know of any episodes of seizures or delirium tremens.  She acknowledges some use of marijuana but again minimizes it  Risk to Self:   Risk to Others:   Prior Inpatient Therapy:   Prior Outpatient Therapy:    Past Medical History: History reviewed. No pertinent past medical history. History reviewed. No pertinent surgical history. Family History: No family history on file. Family Psychiatric  History: Mother has chronic anxiety Social History:  Social History   Substance and Sexual Activity  Alcohol Use Yes   Comment: More than 4 glasses of wine, mixed drinks,  beer daily.      Social History   Substance and Sexual Activity  Drug Use Not Currently   Comment: former use of marijuana    Social History   Socioeconomic History  . Marital status: Single    Spouse name: Not on file  . Number of children: Not on file  . Years of education: Not on file  . Highest education level: Not on file  Occupational History  . Not on file  Tobacco Use  .  Smoking status: Former Smoker    Types: Cigarettes, Cigars  . Smokeless tobacco: Never Used  Vaping Use  . Vaping Use: Never used  Substance and Sexual Activity  . Alcohol use: Yes    Comment: More than 4 glasses of wine, mixed drinks, beer daily.   . Drug use: Not Currently    Comment: former use of marijuana  . Sexual activity: Not on file  Other Topics Concern  . Not on file  Social History Narrative  . Not on file   Social Determinants of Health   Financial Resource Strain: Not on file  Food Insecurity: Not on file  Transportation Needs: Not on file  Physical Activity: Not on file  Stress: Not on file  Social Connections: Not on file   Additional Social History:    Allergies:  Not on File  Labs:  Results for orders placed or performed during the hospital encounter of 07/24/20 (from the past 48 hour(s))  Urinalysis, Complete w Microscopic     Status: Abnormal   Collection Time: 07/24/20  4:47 PM  Result Value Ref Range   Color, Urine AMBER (A) YELLOW    Comment: BIOCHEMICALS MAY BE AFFECTED BY COLOR   APPearance HAZY (A) CLEAR   Specific Gravity, Urine 1.015 1.005 - 1.030   pH 5.0 5.0 - 8.0   Glucose, UA NEGATIVE NEGATIVE mg/dL   Hgb urine dipstick NEGATIVE NEGATIVE   Bilirubin Urine SMALL (A) NEGATIVE   Ketones, ur 20 (A) NEGATIVE mg/dL   Protein, ur NEGATIVE NEGATIVE mg/dL   Nitrite NEGATIVE NEGATIVE   Leukocytes,Ua NEGATIVE NEGATIVE   RBC / HPF 0-5 0 - 5 RBC/hpf   WBC, UA 0-5 0 - 5 WBC/hpf   Bacteria, UA RARE (A) NONE SEEN   Squamous Epithelial / LPF 0-5 0 - 5   Mucus PRESENT    Hyaline Casts, UA PRESENT     Comment: Performed at [facility], [address], [place], [place] [address]  Urine Drug Screen, Qualitative ([facility] only)     Status: None   Collection Time: 07/24/20  4:47 PM  Result Value Ref Range   Tricyclic, Ur Screen NONE DETECTED NONE DETECTED   Amphetamines, Ur Screen NONE DETECTED NONE DETECTED   MDMA (Ecstasy)Ur Screen NONE  DETECTED NONE DETECTED   Cocaine Metabolite,Ur [place] NONE DETECTED NONE DETECTED   Opiate, Ur Screen NONE DETECTED NONE DETECTED   Phencyclidine (PCP) Ur S NONE DETECTED NONE DETECTED   Cannabinoid 50 Ng, Ur [place] NONE DETECTED NONE DETECTED   Barbiturates, Ur Screen NONE DETECTED NONE DETECTED   Benzodiazepine, Ur Scrn NONE DETECTED NONE DETECTED   Methadone Scn, Ur NONE DETECTED NONE DETECTED    Comment: (NOTE) Tricyclics + metabolites, urine    Cutoff 1000 ng/mL Amphetamines + metabolites, urine  Cutoff 1000 ng/mL MDMA (Ecstasy), urine              Cutoff 500 ng/mL Cocaine Metabolite, urine  Cutoff 300 ng/mL Opiate + metabolites, urine        Cutoff 300 ng/mL Phencyclidine (PCP), urine         Cutoff 25 ng/mL Cannabinoid, urine                 Cutoff 50 ng/mL Barbiturates + metabolites, urine  Cutoff 200 ng/mL Benzodiazepine, urine              Cutoff 200 ng/mL Methadone, urine                   Cutoff 300 ng/mL  The urine drug screen provides only a preliminary, unconfirmed analytical test result and should not be used for non-medical purposes. Clinical consideration and professional judgment should be applied to any positive drug screen result due to possible interfering substances. A more specific alternate chemical method must be used in order to obtain a confirmed analytical result. Gas chromatography / mass spectrometry (GC/MS) is the preferred confirm atory method. Performed at [facility], [address] [place], [place] [address]   SARS CORONAVIRUS 2 (TAT 6-24 HRS) Nasopharyngeal Nasopharyngeal Swab     Status: None   Collection Time: 07/24/20  4:47 PM   Specimen: Nasopharyngeal Swab  Result Value Ref Range   SARS Coronavirus 2 NEGATIVE NEGATIVE    Comment: (NOTE) SARS-CoV-2 target nucleic acids are NOT DETECTED.  The SARS-CoV-2 RNA is generally detectable in upper and lower respiratory specimens during the acute phase of infection. Negative results  do not preclude SARS-CoV-2 infection, do not rule out co-infections with other pathogens, and should not be used as the sole basis for treatment or other patient management decisions. Negative results must be combined with clinical observations, patient history, and epidemiological information. The expected result is Negative.  Fact Sheet for Patients: HairSlick.no  Fact Sheet for Healthcare Providers: quierodirigir.com  This test is not yet approved or cleared by the [place] FDA and  has been authorized for detection and/or diagnosis of SARS-CoV-2 by FDA under an Emergency Use Authorization (EUA). This EUA will remain  in effect (meaning this test can be used) for the duration of the COVID-19 declaration under Se ction 564(b)(1) of the Act, 21 U.S.C. section 360bbb-3(b)(1), unless the authorization is terminated or revoked sooner.  Performed at [facility], [address] [place], [place] [address]   Protime-INR     Status: Abnormal   Collection Time: 07/24/20  4:47 PM  Result Value Ref Range   Prothrombin Time 16.4 (H) 11.4 - 15.2 seconds   INR 1.3 (H) 0.8 - 1.2    Comment: (NOTE) INR goal varies based on device and disease states. Performed at [facility], [address] [place], [place] [address]   Lactic acid, plasma     Status: Abnormal   Collection Time: 07/24/20  5:56 PM  Result Value Ref Range   Lactic Acid, Venous 3.4 (HH) 0.5 - 1.9 mmol/L    Comment: CRITICAL RESULT CALLED TO, READ BACK BY AND VERIFIED WITH RYAN WHITE AT 1827 ON 07/24/20 BY SS Performed at [facility], [address] [place], [place] [address]   Ammonia     Status: Abnormal   Collection Time: 07/24/20  5:56 PM  Result Value Ref Range   Ammonia <9 (L) 9 - 35 umol/L    Comment: Performed at [facility], [address] [place], [place] [address]  Blood culture (routine x 2)     Status: None  (Preliminary  result)   Collection Time: 07/24/20  7:12 PM   Specimen: BLOOD  Result Value Ref Range   Specimen Description BLOOD RIGHT ANTECUBITAL    Special Requests      BOTTLES DRAWN AEROBIC AND ANAEROBIC Blood Culture adequate volume   Culture  Setup Time      GRAM POSITIVE RODS AEROBIC BOTTLE ONLY CRITICAL RESULT CALLED TO, READ BACK BY AND VERIFIED WITH: LISA KLUTTZ AT 1254 ON 07/26/20 [facility]. Performed at [facility], [address] [place], [place] [address]    Culture GRAM POSITIVE RODS    Report Status PENDING   Blood culture (routine x 2)     Status: None (Preliminary result)   Collection Time: 07/24/20  7:12 PM   Specimen: BLOOD  Result Value Ref Range   Specimen Description BLOOD LEFT ANTECUBITAL    Special Requests      BOTTLES DRAWN AEROBIC AND ANAEROBIC Blood Culture adequate volume   Culture      NO GROWTH 2 DAYS Performed at [facility], [address], [place], [place] [address]    Report Status PENDING   HIV Antibody (routine testing w rflx)     Status: None   Collection Time: 07/24/20  9:31 PM  Result Value Ref Range   HIV Screen 4th Generation wRfx Non Reactive Non Reactive    Comment: Performed at [facility], [address] [place], [place] [address]  Potassium     Status: Abnormal   Collection Time: 07/24/20  9:31 PM  Result Value Ref Range   Potassium 3.1 (L) 3.5 - 5.1 mmol/L    Comment: HEMOLYSIS AT THIS LEVEL MAY AFFECT RESULT Performed at [facility], [address] [place], [place] [address]   Magnesium     Status: None   Collection Time: 07/24/20  9:31 PM  Result Value Ref Range   Magnesium 1.9 1.7 - 2.4 mg/dL    Comment: Performed at [facility], [address] [place], [place] [address]  Phosphorus     Status: Abnormal   Collection Time: 07/24/20  9:31 PM  Result Value Ref Range   Phosphorus 1.7 (L) 2.5 - 4.6 mg/dL    Comment: Performed at [facility], [address]  [place], [place] [address]  CBC     Status: Abnormal   Collection Time: 07/24/20  9:31 PM  Result Value Ref Range   WBC 12.8 (H) 4.0 - 10.5 K/uL   RBC 2.98 (L) 3.87 - 5.11 MIL/uL   Hemoglobin 11.0 (L) 12.0 - 15.0 g/dL   HCT 13.2 (L) 44.0 - 10.2 %   MCV 101.7 (H) 80.0 - 100.0 fL   MCH 36.9 (H) 26.0 - 34.0 pg   MCHC 36.3 (H) 30.0 - 36.0 g/dL   RDW 72.5 (H) 36.6 - 44.0 %   Platelets 328 150 - 400 K/uL   nRBC 0.2 0.0 - 0.2 %    Comment: Performed at [facility], [address] [place], [place] [address]  Folate, serum, performed at [facility] lab     Status: Abnormal   Collection Time: 07/25/20  5:22 AM  Result Value Ref Range   Folate 3.3 (L) >5.9 ng/mL    Comment: Performed at [facility], [address] [place], [place] [address]  Comprehensive metabolic panel     Status: Abnormal   Collection Time: 07/25/20  5:56 AM  Result Value Ref Range   Sodium 137 135 - 145 mmol/L  Potassium 3.4 (L) 3.5 - 5.1 mmol/L   Chloride 101 98 - 111 mmol/L   CO2 24 22 - 32 mmol/L   Glucose, Bld 80 70 - 99 mg/dL    Comment: Glucose reference range applies only to samples taken after fasting for at least 8 hours.   BUN 5 (L) 8 - 23 mg/dL   Creatinine, Ser 1.61 (L) 0.44 - 1.00 mg/dL   Calcium 6.8 (L) 8.9 - 10.3 mg/dL   Total Protein 5.4 (L) 6.5 - 8.1 g/dL   Albumin 2.0 (L) 3.5 - 5.0 g/dL   AST 49 (H) 15 - 41 U/L   ALT 20 0 - 44 U/L   Alkaline Phosphatase 84 38 - 126 U/L   Total Bilirubin 3.3 (H) 0.3 - 1.2 mg/dL   GFR, Estimated >09 >60 mL/min    Comment: (NOTE) Calculated using the CKD-EPI Creatinine Equation (2021)    Anion gap 12 5 - 15    Comment: Performed at [facility], [address] [place], [place] [address]  CBC     Status: Abnormal   Collection Time: 07/25/20  5:56 AM  Result Value Ref Range   WBC 9.7 4.0 - 10.5 K/uL   RBC 3.12 (L) 3.87 - 5.11 MIL/uL   Hemoglobin 11.2 (L) 12.0 - 15.0 g/dL   HCT 81.1 (L) 91.4 - 78.2 %   MCV 104.5 (H) 80.0 - 100.0 fL    MCH 35.9 (H) 26.0 - 34.0 pg   MCHC 34.4 30.0 - 36.0 g/dL   RDW 95.6 (H) 21.3 - 08.6 %   Platelets 295 150 - 400 K/uL   nRBC 0.0 0.0 - 0.2 %    Comment: Performed at [facility], [address] [place], [place] [address]  Lactic acid, plasma     Status: None   Collection Time: 07/25/20  5:56 AM  Result Value Ref Range   Lactic Acid, Venous 1.7 0.5 - 1.9 mmol/L    Comment: Performed at [facility], [address] [place], [place] [address]  Phosphorus     Status: None   Collection Time: 07/26/20  5:57 AM  Result Value Ref Range   Phosphorus 2.6 2.5 - 4.6 mg/dL    Comment: Performed at [facility], [address] [place], [place] [address]  Magnesium     Status: None   Collection Time: 07/26/20  5:57 AM  Result Value Ref Range   Magnesium 1.7 1.7 - 2.4 mg/dL    Comment: Performed at [facility], [address] [place], [place] [address]  Basic metabolic panel     Status: Abnormal   Collection Time: 07/26/20  5:57 AM  Result Value Ref Range   Sodium 134 (L) 135 - 145 mmol/L   Potassium 3.9 3.5 - 5.1 mmol/L   Chloride 100 98 - 111 mmol/L   CO2 25 22 - 32 mmol/L   Glucose, Bld 74 70 - 99 mg/dL    Comment: Glucose reference range applies only to samples taken after fasting for at least 8 hours.   BUN <5 (L) 8 - 23 mg/dL   Creatinine, Ser 0.27 (L) 0.44 - 1.00 mg/dL   Calcium 7.6 (L) 8.9 - 10.3 mg/dL   GFR, Estimated >25 >36 mL/min    Comment: (NOTE) Calculated using the CKD-EPI Creatinine Equation (2021)    Anion gap 9 5 - 15    Comment: Performed at [facility], [address] [place], [place] [address]  Current Facility-Administered Medications  Medication Dose Route Frequency Provider Last Rate Last Admin  . acetaminophen (TYLENOL) tablet 650 mg  650 mg Oral Q6H PRN Enedina Finner, MD      . enoxaparin (LOVENOX) injection 40 mg  40 mg Subcutaneous Q24H Lilia Pro, MD   40 mg at 07/25/20 2031  . feeding  supplement (ENSURE ENLIVE / ENSURE PLUS) liquid 237 mL  237 mL Oral BID BM Enedina Finner, MD   237 mL at 07/26/20 1531  . folic acid (FOLVITE) tablet 1 mg  1 mg Oral Daily Acheampong, Genice Rouge, MD   1 mg at 07/26/20 0925  . LORazepam (ATIVAN) tablet 0.5 mg  0.5 mg Oral Q12H PRN Enedina Finner, MD      . multivitamin with minerals tablet 1 tablet  1 tablet Oral Daily Acheampong, Genice Rouge, MD   1 tablet at 07/26/20 0925  . ondansetron (ZOFRAN) tablet 4 mg  4 mg Oral Q6H PRN Acheampong, Genice Rouge, MD       Or  . ondansetron [facility]) injection 4 mg  4 mg Intravenous Q6H PRN Lilia Pro, MD   4 mg at 07/24/20 2112  . senna-docusate (Senokot-S) tablet 1 tablet  1 tablet Oral QHS PRN Acheampong, Genice Rouge, MD      . Melene Muller ON 07/27/2020] thiamine tablet 50 mg  50 mg Oral Daily Enedina Finner, MD        Musculoskeletal: Strength & Muscle Tone: decreased Gait & Station: Not observed Patient leans: N/A            Psychiatric Specialty Exam:  Presentation  General Appearance: No data recorded Eye Contact:No data recorded Speech:No data recorded Speech Volume:No data recorded Handedness:No data recorded  Mood and Affect  Mood:No data recorded Affect:No data recorded  Thought Process  Thought Processes:No data recorded Descriptions of Associations:No data recorded Orientation:No data recorded Thought Content:No data recorded History of Schizophrenia/Schizoaffective disorder:No data recorded Duration of Psychotic Symptoms:No data recorded Hallucinations:No data recorded Ideas of Reference:No data recorded Suicidal Thoughts:No data recorded Homicidal Thoughts:No data recorded  Sensorium  Memory:No data recorded Judgment:No data recorded Insight:No data recorded  Executive Functions  Concentration:No data recorded Attention Span:No data recorded Recall:No data recorded Fund of Knowledge:No data recorded Language:No data recorded  Psychomotor Activity  Psychomotor Activity:No  data recorded  Assets  Assets:No data recorded  Sleep  Sleep:No data recorded  Physical Exam: Physical Exam Vitals and nursing note reviewed.  Constitutional:      Appearance: Normal appearance.  HENT:     Head: Normocephalic and atraumatic.     Mouth/Throat:     Pharynx: Oropharynx is clear.  Eyes:     Pupils: Pupils are equal, round, and reactive to light.  Cardiovascular:     Rate and Rhythm: Normal rate and regular rhythm.  Pulmonary:     Effort: Pulmonary effort is normal.     Breath sounds: Normal breath sounds.  Abdominal:     General: Abdomen is flat.     Palpations: Abdomen is soft.  Musculoskeletal:        General: Normal range of motion.  Skin:    General: Skin is warm and dry.  Neurological:     General: No focal deficit present.     Mental Status: She is alert. Mental status is at baseline.  Psychiatric:        Attention and Perception: She is inattentive.        Mood and Affect: Mood normal. Affect is blunt.  Speech: Speech is delayed.        Behavior: Behavior is slowed and withdrawn.        Thought Content: Thought content normal. Thought content does not include homicidal or suicidal ideation.        Cognition and Memory: Cognition is impaired. Memory is impaired.    Review of Systems  Constitutional: Negative.   HENT: Negative.   Eyes: Negative.        Jaundiced  Respiratory: Negative.   Cardiovascular: Negative.   Gastrointestinal: Negative.   Musculoskeletal: Negative.   Skin: Negative.   Neurological: Negative.   Psychiatric/Behavioral: Positive for depression, memory loss and substance abuse. Negative for hallucinations and suicidal ideas. The patient is nervous/anxious.    Blood pressure 102/68, pulse 100, temperature 97.6 F (36.4 C), resp. rate 16, height 5\' 1"  (1.549 m), weight 59 kg, SpO2 100 %. Body mass index is 24.56 kg/m.  Treatment Plan Summary: Medication management and Plan This is a 65 year old woman who appears to  be drinking excessively.  She will not quantify it.  She has significant liver injury on her lab tests and physically looks like somebody with liver problems.  I am going to go ahead and check an ammonia level because she really does seem cognitively sluggish in a way that apparently is not her baseline.  Patient appears to have depression and would benefit from treatment.  Spent some time explaining this to her and explaining the use of antidepressants.  I will order citalopram 20 mg a day.  Patient does not take any medicine at home and I suspect that if she goes home by herself there is very little chance that she will follow through with any treatment as she has very poor insight.  Tried to work on encouraging her to go home with family instead when she leaves the hospital.  She will need referral to outpatient mental health and substance abuse treatment when she does leave.  Disposition: Patient does not meet criteria for psychiatric inpatient admission. Supportive therapy provided about ongoing stressors. Discussed crisis plan, support from social network, calling 911, coming to the Emergency Department, and calling Suicide Hotline.  77, MD 07/26/2020 4:40 PM

## 2020-07-26 NOTE — Evaluation (Signed)
Physical Therapy Evaluation Patient Details Name: Valerie Leonard MRN: [id number] DOB: 04/21/1955 Today's Date: 07/26/2020   History of Present Illness  Valerie Leonard is an 65 y.o. female with no significant past medical history except for chronic alcohol abuse and remote history of tobacco smoking, quit several years ago. Patient apparently fell prior to admission and neighbor recommended calling EMS. Patient is poor historian and unable to tell me why she is here.    Clinical Impression  Patient received in bed, covers off, arms inside gown. She is more alert this morning, but confused, not oriented. Patient able to follow some direction to attempt to sit up on side of bed but ultimately required mod/max assist to get to sitting and once sitting has very poor sitting balance requiring constant hands on assist to prevent from falling over. Patient will continue to benefit from skilled PT while here to improve functional independence, strength and safety.      Follow Up Recommendations Supervision/Assistance - 24 hour    Equipment Recommendations  Other (comment) (TBD)    Recommendations for Other Services       Precautions / Restrictions Precautions Precautions: Fall Restrictions Weight Bearing Restrictions: No      Mobility  Bed Mobility Overal bed mobility: Needs Assistance Bed Mobility: Supine to Sit;Sit to Supine     Supine to sit: Mod assist Sit to supine: Mod assist   General bed mobility comments: patient attempts to get to the edge of bed but needs mod assist to raise trunk to sitting position. Immediately starts to fall to her left in sitting. Requires hands on assist to maintain sitting balance.    Transfers                 General transfer comment: not attempted due to poor sitting balance/unsafe  Ambulation/Gait             General Gait Details: not attempted/unsafe  Stairs            Wheelchair Mobility    Modified Rankin (Stroke Patients  Only)       Balance Overall balance assessment: Needs assistance Sitting-balance support: Feet supported Sitting balance-Leahy Scale: Poor Sitting balance - Comments: patient requires constant assist to maintain static sitting at edge of bed. Falling to her left despite cues to maintain midline. Postural control: Left lateral lean                                   Pertinent Vitals/Pain Pain Assessment: No/denies pain    Home Living Family/patient expects to be discharged to:: Private residence Living Arrangements: Alone Available Help at Discharge: Family;Available PRN/intermittently             Additional Comments: Nicholes Rough of home environment, patient unable to provide info, no family present. Per CM she was living alone. Mother is planning to take her home with her at discharge.    Prior Function Level of Independence: Independent               Hand Dominance        Extremity/Trunk Assessment   Upper Extremity Assessment Upper Extremity Assessment: Generalized weakness    Lower Extremity Assessment Lower Extremity Assessment: Generalized weakness       Communication      Cognition Arousal/Alertness: Lethargic Behavior During Therapy: WFL for tasks assessed/performed Overall Cognitive Status: No family/caregiver present to determine baseline cognitive functioning  General Comments      Exercises     Assessment/Plan    PT Assessment Patient needs continued PT services  PT Problem List Decreased strength;Decreased mobility;Decreased balance;Decreased safety awareness;Decreased cognition       PT Treatment Interventions Therapeutic exercise;Gait training;Balance training;Functional mobility training;Therapeutic activities;Patient/family education    PT Goals (Current goals can be found in the Care Plan section)  Acute Rehab PT Goals Patient Stated Goal: none stated PT Goal  Formulation: Patient unable to participate in goal setting Time For Goal Achievement: 08/09/20    Frequency Min 2X/week   Barriers to discharge Decreased caregiver support      Co-evaluation               AM-PAC PT "6 Clicks" Mobility  Outcome Measure Help needed turning from your back to your side while in a flat bed without using bedrails?: A Lot Help needed moving from lying on your back to sitting on the side of a flat bed without using bedrails?: A Lot Help needed moving to and from a bed to a chair (including a wheelchair)?: Total Help needed standing up from a chair using your arms (e.g., wheelchair or bedside chair)?: Total Help needed to walk in hospital room?: Total Help needed climbing 3-5 steps with a railing? : Total 6 Click Score: 8    End of Session   Activity Tolerance: Patient limited by lethargy Patient left: in bed;with bed alarm set;with nursing/sitter in room Nurse Communication: Mobility status PT Visit Diagnosis: Other abnormalities of gait and mobility (R26.89);History of falling (Z91.81)    Time: 6213-0865 PT Time Calculation (min) (ACUTE ONLY): 19 min   Charges:   PT Evaluation $PT Eval Moderate Complexity: 1 Mod          Cedricka Sackrider, PT, GCS 07/26/20,9:53 AM

## 2020-07-26 NOTE — TOC Progression Note (Addendum)
Transition of Care [facility]) - Progression Note    Patient Details  Name: Valerie Leonard MRN: [id number] Date of Birth: Dec 16, 1955  [facility] CM/SW Contact  Barrie Dunker, RN Phone Number: 07/26/2020, 1:39 PM  Clinical Narrative:    [facility], they stated that new patients for hospital follow up is scheduled out more than a month and the patient will have to call to schedule. [facility], sent thru the system and provided the patient with application Patient will get medications from med mgt, patient will get [facility] services thru charity, she will get a RW and 3 in 1 from [employer] [facility]       Expected Discharge Plan and Services                                                 Social Determinants of Health (SDOH) Interventions    Readmission Risk Interventions No flowsheet data found.

## 2020-07-26 NOTE — Progress Notes (Signed)
PHARMACY - PHYSICIAN COMMUNICATION CRITICAL VALUE ALERT - BLOOD CULTURE IDENTIFICATION (BCID)  Valerie Leonard is an 65 y.o. female who presented to [facility] on 07/24/2020 with a chief complaint of fall at home  Assessment:  Blood cx from 4/19 with GPR, 1 of 4 bottles. BCID was not performed.  CT abdomen - no acute findings  Name of physician (or Provider) Contacted: Dr Eliane Decree  Current antibiotics: Ceftriaxone  Changes to prescribed antibiotics recommended:  Recommendations accepted by provider - suspect contaminant, continue to monitor culture  No results found for this or any previous visit.  [name], PharmD, BCPS.   Work Cell: [phone] 07/26/2020 1:18 PM

## 2020-07-26 NOTE — Progress Notes (Signed)
[facility]  - [place] [facility]   PATIENT NAME: Valerie Leonard    MR#:  [id number]  DATE OF BIRTH:  08/28/1955  SUBJECTIVE:  patient more awake and conversing. Slow to respond however answer most most questions appropriately. Her mother and father in the room. Patient ate some breakfast drinking orange juice.  Denies any pain. REVIEW OF SYSTEMS:   Review of Systems  Constitutional: Positive for malaise/fatigue. Negative for chills, fever and weight loss.  HENT: Negative for ear discharge, ear pain and nosebleeds.   Eyes: Negative for blurred vision, pain and discharge.  Respiratory: Negative for sputum production, shortness of breath, wheezing and stridor.   Cardiovascular: Negative for chest pain, palpitations, orthopnea and PND.  Gastrointestinal: Negative for abdominal pain, diarrhea, nausea and vomiting.  Genitourinary: Negative for frequency and urgency.  Musculoskeletal: Negative for back pain and joint pain.  Neurological: Positive for weakness. Negative for sensory change, speech change and focal weakness.  Psychiatric/Behavioral: Negative for depression and hallucinations. The patient is not nervous/anxious.    Tolerating Diet:yes Tolerating PT: HH with supervision  DRUG ALLERGIES:  Not on File  VITALS:  Blood pressure 106/82, pulse (!) 110, temperature (!) 97.4 F (36.3 C), temperature source Oral, resp. rate 16, height 5\' 1"  (1.549 m), weight 59 kg, SpO2 96 %.  PHYSICAL EXAMINATION:   Physical Exam  GENERAL:  65 y.o.-year-old patient lying in the bed with no acute distress.  LUNGS: Normal breath sounds bilaterally, no wheezing, rales, rhonchi. No use of accessory muscles of respiration.  CARDIOVASCULAR: S1, S2 normal. No murmurs, rubs, or gallops.  ABDOMEN: Soft, nontender, nondistended. Bowel sounds present. No organomegaly or mass.  EXTREMITIES: No cyanosis, clubbing or edema b/l.    NEUROLOGIC: no focal weakness. PSYCHIATRIC:  patient is  awake and alert.  SKIN: No obvious rash, lesion, or ulcer.   LABORATORY PANEL:  CBC Recent Labs  Lab 07/25/20 0556  WBC 9.7  HGB 11.2*  HCT 32.6*  PLT 295    Chemistries  Recent Labs  Lab 07/25/20 0556 07/26/20 0557  NA 137 134*  K 3.4* 3.9  CL 101 100  CO2 24 25  GLUCOSE 80 74  BUN 5* <5*  CREATININE 0.35* 0.40*  CALCIUM 6.8* 7.6*  MG  --  1.7  AST 49*  --   ALT 20  --   ALKPHOS 84  --   BILITOT 3.3*  --    Cardiac Enzymes No results for input(s): TROPONINI in the last 168 hours. RADIOLOGY:  CT Head Wo Contrast  Result Date: 07/24/2020 CLINICAL DATA:  07/26/2020 out of bed. EXAM: CT HEAD WITHOUT CONTRAST CT CERVICAL SPINE WITHOUT CONTRAST TECHNIQUE: Multidetector CT imaging of the head and cervical spine was performed following the standard protocol without intravenous contrast. Multiplanar CT image reconstructions of the cervical spine were also generated. COMPARISON:  None. FINDINGS: CT HEAD FINDINGS Brain: No evidence of acute infarction, hemorrhage, hydrocephalus, extra-axial collection or mass lesion/mass effect. Mild generalized cerebral atrophy. Scattered moderate periventricular and subcortical white matter hypodensities are nonspecific, but favored to reflect chronic microvascular ischemic changes. Vascular: Calcified atherosclerosis at the skullbase. No hyperdense vessel. Skull: Normal. Negative for fracture or focal lesion. Sinuses/Orbits: No acute finding. Other: None. CT CERVICAL SPINE FINDINGS Alignment: No traumatic malalignment. Reversal of the normal cervical lordosis. Skull base and vertebrae: No acute fracture. No primary bone lesion or focal pathologic process. Soft tissues and spinal canal: No prevertebral fluid or swelling. No visible canal hematoma. Disc levels: Mild C4-C5,  moderate C5-C6, and severe C6-C7 disc height loss with associated endplate spurring. Upper chest: Negative. Other: None. IMPRESSION: 1. No acute intracranial abnormality. Mild cerebral  atrophy and moderate chronic microvascular ischemic changes. 2. No acute cervical spine fracture or traumatic malalignment. Multilevel cervical spondylosis, severe at C6-C7. Electronically Signed   By: Obie Dredge M.D.   On: 07/24/2020 16:09   CT Cervical Spine Wo Contrast  Result Date: 07/24/2020 CLINICAL DATA:  Larey Seat out of bed. EXAM: CT HEAD WITHOUT CONTRAST CT CERVICAL SPINE WITHOUT CONTRAST TECHNIQUE: Multidetector CT imaging of the head and cervical spine was performed following the standard protocol without intravenous contrast. Multiplanar CT image reconstructions of the cervical spine were also generated. COMPARISON:  None. FINDINGS: CT HEAD FINDINGS Brain: No evidence of acute infarction, hemorrhage, hydrocephalus, extra-axial collection or mass lesion/mass effect. Mild generalized cerebral atrophy. Scattered moderate periventricular and subcortical white matter hypodensities are nonspecific, but favored to reflect chronic microvascular ischemic changes. Vascular: Calcified atherosclerosis at the skullbase. No hyperdense vessel. Skull: Normal. Negative for fracture or focal lesion. Sinuses/Orbits: No acute finding. Other: None. CT CERVICAL SPINE FINDINGS Alignment: No traumatic malalignment. Reversal of the normal cervical lordosis. Skull base and vertebrae: No acute fracture. No primary bone lesion or focal pathologic process. Soft tissues and spinal canal: No prevertebral fluid or swelling. No visible canal hematoma. Disc levels: Mild C4-C5, moderate C5-C6, and severe C6-C7 disc height loss with associated endplate spurring. Upper chest: Negative. Other: None. IMPRESSION: 1. No acute intracranial abnormality. Mild cerebral atrophy and moderate chronic microvascular ischemic changes. 2. No acute cervical spine fracture or traumatic malalignment. Multilevel cervical spondylosis, severe at C6-C7. Electronically Signed   By: Obie Dredge M.D.   On: 07/24/2020 16:09   CT ABDOMEN PELVIS W  CONTRAST  Result Date: 07/24/2020 CLINICAL DATA:  Fall EXAM: CT ABDOMEN AND PELVIS WITH CONTRAST TECHNIQUE: Multidetector CT imaging of the abdomen and pelvis was performed using the standard protocol following bolus administration of intravenous contrast. CONTRAST:  OMNIPAQUE IOHEXOL 300 MG/ML  SOLN COMPARISON:  None. FINDINGS: Lower chest: No acute abnormality. Hepatobiliary: Diffuse heterogeneous low-density throughout the liver, likely fatty infiltration. No evidence of hepatic injury or perihepatic hematoma. Gallbladder unremarkable. Pancreas: No focal abnormality or ductal dilatation. Spleen: No splenic injury or perisplenic hematoma. Adrenals/Urinary Tract: No adrenal hemorrhage or renal injury identified. Bladder is unremarkable. Stomach/Bowel: Stomach, large and small bowel grossly unremarkable. Vascular/Lymphatic: Aortic atherosclerosis. No evidence of aneurysm or adenopathy. Reproductive: Uterus and adnexa unremarkable.  No mass. Other: No free fluid or free air. Musculoskeletal: No acute bony abnormality. IMPRESSION: No acute findings or evidence of significant traumatic injury in the abdomen or pelvis. Hepatic steatosis. Aortic atherosclerosis. Electronically Signed   By: Charlett Nose M.D.   On: 07/24/2020 19:37   DG Chest Portable 1 View  Result Date: 07/24/2020 CLINICAL DATA:  Recent fall EXAM: PORTABLE CHEST 1 VIEW COMPARISON:  None. FINDINGS: The heart size and mediastinal contours are within normal limits. Both lungs are clear. The visualized skeletal structures are unremarkable. IMPRESSION: No acute abnormality noted. Electronically Signed   By: Alcide Clever M.D.   On: 07/24/2020 16:54   US ABDOMEN LIMITED RUQ (LIVER/GB)  Result Date: 07/24/2020 CLINICAL DATA:  Elevated bilirubin EXAM: ULTRASOUND ABDOMEN LIMITED RIGHT UPPER QUADRANT COMPARISON:  None. FINDINGS: Gallbladder: Gallbladder is well distended with considerable gallbladder sludge within. No cholelithiasis is noted. No  wall thickening is seen. Common bile duct: Diameter: 4 mm. Liver: Diffusely increased in echogenicity consistent with fatty infiltration. No mass  lesion is noted. Portal vein is patent on color Doppler imaging with normal direction of blood flow towards the liver. Other: None. IMPRESSION: Gallbladder sludge without complicating factors. No cholelithiasis is seen. Fatty liver. Electronically Signed   By: Alcide Clever M.D.   On: 07/24/2020 17:54   ASSESSMENT AND PLAN:  Shaeley Segall is an 65 y.o. female with no significant past medical history except for chronic alcohol abuse and remote history of tobacco smoking, quit several years ago. Patient was brought in by family after she had an accident fall at home. She has history of chronic alcoholism.  Mechanical fall Acute metabolic encephalopathy -- Most likely accidental without any associated encephalopathy. --  CT head and neck were negative for any acute abnormality.   --Incidental finding of multilevel cervical spondylosis noted.   --Patient is asymptomatic. --no source of infection noted. D/c Rocephin --BCID 1/4 GPR--appears contamination  Acute hypokalemia, hypophosphatemia,Hypomagnesemia Poor po intake -Most likely due to poor oral intake.  No obvious EKG abnormalities at this time.   -- pharmacy to replace electrolytes--improved  Chronic  Alcohol dependence and abuse:  acute alcoholic hepatitis with hepatic steatosis  --alcohol level were normal on admission.   --No evidence of alcohol withdrawal at this time.  -- Alcohol withdrawal protocol will be instituted with lorazepam as needed.  --Thiamine and folate replacement therapy will be scheduled.   Alcoholic liver disease: --Elevated AST and total bilirubin noted.  -- CT scan shows hepatic steatosis without any acute intra-abdominal findings.  Elevated lactic acidosis: 3.4--1.7  Patient will be adequately volume repleted.   Depression --mother reports patient has been starving  herself and has lost interest in her social life. Has had made comments regarding ending her life to be with her husband -- psychiatry consultation with Dr. Toni Amend appreciated--will follow recommendations.  Folate deficiency --start FA 1 mg po daily    Procedures: Family communication :mother and sister Consults :psych CODE STATUS: full DVT Prophylaxis :lovenox Level of care: Med-Surg Status is: Observation  The patient remains OBS appropriate and will d/c before 2 midnights.  Dispo: The patient is from: Home              Anticipated d/c is to: Home              Patient currently is not medically stable to d/c.   Difficult to place patient No        TOTAL TIME TAKING CARE OF THIS PATIENT: 25 minutes.  >50% time spent on counselling and coordination of care  Note: This dictation was prepared with Dragon dictation along with smaller phrase technology. Any transcriptional errors that result from this process are unintentional.  Enedina Finner M.D    [facility]   CC: Primary care physician; Pcp, NoPatient ID: Valerie Leonard, female   DOB: 1956-03-07, 65 y.o.   MRN: [id number]

## 2020-07-27 LAB — BLOOD CULTURE ID PANEL (REFLEXED) - BCID2

## 2020-07-27 LAB — GLUCOSE, CAPILLARY
Glucose-Capillary: 102 mg/dL — ABNORMAL HIGH (ref 70–99)
Glucose-Capillary: 102 mg/dL — ABNORMAL HIGH (ref 70–99)
Glucose-Capillary: 107 mg/dL — ABNORMAL HIGH (ref 70–99)
Glucose-Capillary: 122 mg/dL — ABNORMAL HIGH (ref 70–99)
Glucose-Capillary: 127 mg/dL — ABNORMAL HIGH (ref 70–99)
Glucose-Capillary: 94 mg/dL (ref 70–99)

## 2020-07-27 MED ORDER — METOPROLOL TARTRATE 25 MG PO TABS
12.5000 mg | ORAL_TABLET | Freq: Two times a day (BID) | ORAL | Status: DC
  Administered 2020-07-27 (×2): 12.5 mg via ORAL
  Filled 2020-07-27 (×2): qty 1

## 2020-07-27 NOTE — Progress Notes (Signed)
   07/26/20 2053  Assess: MEWS Score  Temp 98.6 F (37 C)  BP 114/84  Pulse Rate (!) 131  Resp 20  SpO2 93 %  O2 Device Room Air  Assess: MEWS Score  MEWS Temp 0  MEWS Systolic 0  MEWS Pulse 3  MEWS RR 0  MEWS LOC 0  MEWS Score 3  MEWS Score Color Yellow  Assess: if the MEWS score is Yellow or Red  Were vital signs taken at a resting state? Yes  Focused Assessment No change from prior assessment  Early Detection of Sepsis Score *See Row Information* Low  Treat  MEWS Interventions Other (Comment) Manuela Schwartz Paged)  Pain Scale 0-10  Pain Score 0  Take Vital Signs  Increase Vital Sign Frequency  Yellow: Q 2hr X 2 then Q 4hr X 2, if remains yellow, continue Q 4hrs  Escalate  MEWS: Escalate Yellow: discuss with charge nurse/RN and consider discussing with provider and RRT  Notify: Charge Nurse/RN  Name of Charge Nurse/RN Notified Gavin Pound  Date Charge Nurse/RN Notified 07/26/20  Time Charge Nurse/RN Notified 2059  Notify: Provider  Provider Name/Title Steward Drone Morrison/NP  Date Provider Notified 07/26/20  Time Provider Notified 2101  Notification Type  (secure chat)  Notification Reason Other (Comment) (Yellow Mews. HR of 131)  Provider response Other (Comment) (Waiting for response)  Document  Patient Outcome Other (Comment) (continue to monitor per guidelines)  Inserted for Deirdre Pippins RN

## 2020-07-27 NOTE — TOC Progression Note (Addendum)
Transition of Care [facility]) - Progression Note    Patient Details  Name: Valerie Leonard MRN: [id number] Date of Birth: [birth year]  [facility]) CM/SW Contact  Barrie Dunker, RN Phone Number: 07/27/2020, 3:12 PM  Clinical Narrative:   I had a lengthy conversation with the family I explained to them she will not have Home health due to her drinking, I encouraged them to go to the courthouse and see about the paper work for possible incompetent hearing, I explained that the resources are very limited, I explained that getting HCPOA paperwork would not be legal at this time due to the patient not being alert and oriented enough to sign a legal document. Physical Therapy will attempt to work with the patient again           Expected Discharge Plan and Services                                                 Social Determinants of Health (SDOH) Interventions    Readmission Risk Interventions No flowsheet data found.

## 2020-07-27 NOTE — Progress Notes (Signed)
PHARMACY - PHYSICIAN COMMUNICATION CRITICAL VALUE ALERT - BLOOD CULTURE IDENTIFICATION (BCID)  Valerie Leonard is an 65 y.o. female who presented to [facility] on 07/24/2020 with a chief complaint of fall at home  Assessment:  Blood cx from 4/19 with GPR, 1 of 4 bottles. BCID was not performed.  CT abdomen - no acute findings  4/22 Lab called with additional results, 1 bottle of other set (aerobic) with GPC, nothing identified on BCID. Patient is afebrile.  Name of physician (or Provider) Contacted: Dr Eliane Decree  Current antibiotics: not currently ordered  Changes to prescribed antibiotics recommended:  Recommendations accepted by provider - suspect contaminant, continue to monitor culture  Results for orders placed or performed during the hospital encounter of 07/24/20  Blood Culture ID Panel (Reflexed) (Collected: 07/24/2020  7:12 PM)  Result Value Ref Range   Enterococcus faecalis NOT DETECTED NOT DETECTED   Enterococcus Faecium NOT DETECTED NOT DETECTED   Listeria monocytogenes NOT DETECTED NOT DETECTED   Staphylococcus species NOT DETECTED NOT DETECTED   Staphylococcus aureus (BCID) NOT DETECTED NOT DETECTED   Staphylococcus epidermidis NOT DETECTED NOT DETECTED   Staphylococcus lugdunensis NOT DETECTED NOT DETECTED   Streptococcus species NOT DETECTED NOT DETECTED   Streptococcus agalactiae NOT DETECTED NOT DETECTED   Streptococcus pneumoniae NOT DETECTED NOT DETECTED   Streptococcus pyogenes NOT DETECTED NOT DETECTED   A.calcoaceticus-baumannii NOT DETECTED NOT DETECTED   Bacteroides fragilis NOT DETECTED NOT DETECTED   Enterobacterales NOT DETECTED NOT DETECTED   Enterobacter cloacae complex NOT DETECTED NOT DETECTED   Escherichia coli NOT DETECTED NOT DETECTED   Klebsiella aerogenes NOT DETECTED NOT DETECTED   Klebsiella oxytoca NOT DETECTED NOT DETECTED   Klebsiella pneumoniae NOT DETECTED NOT DETECTED   Proteus species NOT DETECTED NOT DETECTED   Salmonella species NOT  DETECTED NOT DETECTED   Serratia marcescens NOT DETECTED NOT DETECTED   Haemophilus influenzae NOT DETECTED NOT DETECTED   Neisseria meningitidis NOT DETECTED NOT DETECTED   Pseudomonas aeruginosa NOT DETECTED NOT DETECTED   Stenotrophomonas maltophilia NOT DETECTED NOT DETECTED   Candida albicans NOT DETECTED NOT DETECTED   Candida auris NOT DETECTED NOT DETECTED   Candida glabrata NOT DETECTED NOT DETECTED   Candida krusei NOT DETECTED NOT DETECTED   Candida parapsilosis NOT DETECTED NOT DETECTED   Candida tropicalis NOT DETECTED NOT DETECTED   Cryptococcus neoformans/gattii NOT DETECTED NOT DETECTED    Clovia Cuff, PharmD, BCPS 07/27/2020 6:33 PM

## 2020-07-27 NOTE — TOC Progression Note (Signed)
Transition of Care [facility]) - Progression Note    Patient Details  Name: Valerie Leonard MRN: [id number] Date of Birth: September 28, 1955  [facility] CM/SW Contact  Barrie Dunker, RN Phone Number: 07/27/2020, 10:40 AM  Clinical Narrative:     [place] with [facility] notified me that they will not accept the patient for [facility] due to her daily drinking, they are the charity Home health Agency for the week, Her family is in the room and aware       Expected Discharge Plan and Services                                                 Social Determinants of Health (SDOH) Interventions    Readmission Risk Interventions No flowsheet data found.

## 2020-07-27 NOTE — Progress Notes (Signed)
Pt HR in 130's. Steward Drone Morrison/NP notified. New orders received. Will cont to monitor the patient.

## 2020-07-27 NOTE — Progress Notes (Signed)
[facility]  - [place] at [facility]   PATIENT NAME: Valerie Leonard    MR#:  [id number]  DATE OF BIRTH:  February 27, 1956  SUBJECTIVE:  patient more awake and conversing. Slow to respond however answer most most questions appropriately. Her mother and sister in the room. Patient ate some breakfast. She intermittently has some confusion  Denies any pain. REVIEW OF SYSTEMS:   Review of Systems  Constitutional: Positive for malaise/fatigue. Negative for chills, fever and weight loss.  HENT: Negative for ear discharge, ear pain and nosebleeds.   Eyes: Negative for blurred vision, pain and discharge.  Respiratory: Negative for sputum production, shortness of breath, wheezing and stridor.   Cardiovascular: Negative for chest pain, palpitations, orthopnea and PND.  Gastrointestinal: Negative for abdominal pain, diarrhea, nausea and vomiting.  Genitourinary: Negative for frequency and urgency.  Musculoskeletal: Negative for back pain and joint pain.  Neurological: Positive for weakness. Negative for sensory change, speech change and focal weakness.  Psychiatric/Behavioral: Negative for depression and hallucinations. The patient is not nervous/anxious.    Tolerating Diet:yes Tolerating PT: HH with supervision  DRUG ALLERGIES:  Not on File  VITALS:  Blood pressure 103/76, pulse (!) 124, temperature 98.3 F (36.8 C), resp. rate 17, height 5\' 1"  (1.549 m), weight 59 kg, SpO2 (!) 89 %.  PHYSICAL EXAMINATION:   Physical Exam  GENERAL:  65 y.o.-year-old patient lying in the bed with no acute distress.  LUNGS: Normal breath sounds bilaterally, no wheezing, rales, rhonchi. No use of accessory muscles of respiration.  CARDIOVASCULAR: S1, S2 normal. No murmurs, rubs, or gallops. Tachycardia ABDOMEN: Soft, nontender, nondistended. Bowel sounds present. No organomegaly or mass.  EXTREMITIES: No cyanosis, clubbing or edema b/l.    NEUROLOGIC: no focal weakness. PSYCHIATRIC:  patient  is awake and alert.  SKIN: No obvious rash, lesion, or ulcer.   LABORATORY PANEL:  CBC Recent Labs  Lab 07/25/20 0556  WBC 9.7  HGB 11.2*  HCT 32.6*  PLT 295    Chemistries  Recent Labs  Lab 07/25/20 0556 07/26/20 0557 07/26/20 1706  NA 137 134*  --   K 3.4* 3.9  --   CL 101 100  --   CO2 24 25  --   GLUCOSE 80 74  --   BUN 5* <5*  --   CREATININE 0.35* 0.40*  --   CALCIUM 6.8* 7.6*  --   MG  --  1.7 1.6*  AST 49*  --   --   ALT 20  --   --   ALKPHOS 84  --   --   BILITOT 3.3*  --   --    Cardiac Enzymes No results for input(s): TROPONINI in the last 168 hours. RADIOLOGY:  No results found. ASSESSMENT AND PLAN:  [name] is an 65 y.o. female with no significant past medical history except for chronic alcohol abuse and remote history of tobacco smoking, quit several years ago. Patient was brought in by family after she had an accident fall at home. She has history of chronic alcoholism.  Mechanical fall Acute metabolic encephalopathy -- Most likely accidental without any associated encephalopathy. --  CT head and neck were negative for any acute abnormality.   --Incidental finding of multilevel cervical spondylosis noted.   --Patient is asymptomatic. --no source of infection noted. D/c Rocephin --BCID 1/4 GPR--appears contamination  Tachycardia -- with history of alcohol abuse will keep an eye for alcohol withdrawal symptoms. Started on low-dose beta-blocker heart rate now in  the 90  Acute hypokalemia, hypophosphatemia,Hypomagnesemia Poor po intake -Most likely due to poor oral intake.  No obvious EKG abnormalities at this time.   -- pharmacy to replace electrolytes--improved  Chronic  Alcohol dependence and abuse:  acute alcoholic hepatitis with hepatic steatosis  --alcohol level were normal on admission.   --No evidence of alcohol withdrawal at this time.  -- Alcohol withdrawal protocol will be instituted with lorazepam as needed.  --Thiamine and  folate replacement therapy will be scheduled.   Alcoholic liver disease: --Elevated AST and total bilirubin noted.  -- CT scan shows hepatic steatosis without any acute intra-abdominal findings.  Elevated lactic acidosis: 3.4--1.7  Patient will be adequately volume repleted.   Depression --mother reports patient has been starving herself and has lost interest in her social life. Has had made comments regarding ending her life to be with her husband -- psychiatry consultation with Dr. Toni Amend appreciated-started on Celexa 20 mg daily  Folate deficiency --start FA 1 mg po daily    Procedures: Family communication :mother and sister in the room Consults :psych CODE STATUS: full DVT Prophylaxis :lovenox Level of care: Med-Surg Status is: Observation  The patient remains OBS appropriate and will d/c before 2 midnights.  Dispo: The patient is from: Home              Anticipated d/c is to: Home              Patient currently is not medically stable to d/c.   Difficult to place patient No  Patient developed tachycardia overnight. She is otherwise hemodynamically stable started on low-dose beta-blocker. Heart rate improved will watch one more day for signs of withdrawal with history of alcoholism. Overall patient improved    TOTAL TIME TAKING CARE OF THIS PATIENT: 25 minutes.  >50% time spent on counselling and coordination of care  Note: This dictation was prepared with Dragon dictation along with smaller phrase technology. Any transcriptional errors that result from this process are unintentional.  Enedina Finner M.D    [facility]   CC: Primary care physician; Pcp, NoPatient ID: Valerie Leonard, female   DOB: [birth year], 65 y.o.   MRN: [id number]

## 2020-07-28 DIAGNOSIS — F322 Major depressive disorder, single episode, severe without psychotic features: Secondary | ICD-10-CM

## 2020-07-28 LAB — GLUCOSE, CAPILLARY
Glucose-Capillary: 103 mg/dL — ABNORMAL HIGH (ref 70–99)
Glucose-Capillary: 84 mg/dL (ref 70–99)
Glucose-Capillary: 96 mg/dL (ref 70–99)

## 2020-07-28 LAB — CULTURE, BLOOD (ROUTINE X 2): Special Requests: ADEQUATE

## 2020-07-28 MED ORDER — ADULT MULTIVITAMIN W/MINERALS CH: 1.0000 | ORAL_TABLET | Freq: Every day | ORAL | 1 refills | Status: AC

## 2020-07-28 MED ORDER — METOPROLOL TARTRATE 25 MG PO TABS
12.5000 mg | ORAL_TABLET | Freq: Every day | ORAL | Status: DC
  Administered 2020-07-28: 12.5 mg via ORAL
  Filled 2020-07-28: qty 1

## 2020-07-28 MED ORDER — CITALOPRAM HYDROBROMIDE 20 MG PO TABS: 20.0000 mg | ORAL_TABLET | Freq: Every day | ORAL | 1 refills | Status: DC

## 2020-07-28 MED ORDER — FOLIC ACID 1 MG PO TABS: 1.0000 mg | ORAL_TABLET | Freq: Every day | ORAL | 0 refills | Status: AC

## 2020-07-28 NOTE — Discharge Summary (Signed)
[facility] - [place] at [facility]   PATIENT NAME: Valerie Leonard    MR#:  [id number]  DATE OF BIRTH:  [birth year]  DATE OF ADMISSION:  07/24/2020 ADMITTING PHYSICIAN: Enedina Finner, MD  DATE OF DISCHARGE: 07/28/2020  PRIMARY CARE PHYSICIAN: Pcp, No    ADMISSION DIAGNOSIS:  Dehydration [E86.0] Hypokalemia [E87.6] Hypomagnesemia [E83.42] Elevated bilirubin [R17] Altered mental status, unspecified altered mental status type [R41.82]  DISCHARGE DIAGNOSIS:  Acute Metabolic encephalopathy-improving Chronic alcoholism Alcoholic hepatitis Electrolyte abnormality Depression SECONDARY DIAGNOSIS:  History reviewed. No pertinent past medical history.  HOSPITAL COURSE:  Valerie Leonard an 65 y.o.femalewith no significant past medical history except for chronic alcohol abuse and remote history of tobacco smoking, quit several years ago. Patient was brought in by family after she had an accident fall at home. She has history of chronic alcoholism.  Mechanical fall Acute metabolic encephalopathy -- Most likely accidental without any associated encephalopathy. -- CT head and neck were negative for any acute abnormality.  --Incidental finding of multilevel cervical spondylosis noted.  --Patient is asymptomatic. --no source of infection noted. D/c Rocephin --BCID 1/4 GPR--appears contamination  Tachycardia -- with history of alcohol abuse will keep an eye for alcohol withdrawal symptoms. Started on low-dose beta-blocker heart rate now in the 90--improved.  -d/c BB  Acute hypokalemia, hypophosphatemia,Hypomagnesemia Poor po intake -Most likely due to poor oral intake. No obvious EKG abnormalities at this time.  -- pharmacy to replace electrolytes--improved  Chronic Alcohol dependence and abuse:  acute alcoholic hepatitis with hepatic steatosis  --alcohol level were normal on admission.  --No evidence of alcohol withdrawal at this time.  --was placed on  CIWA  --cont MVI  Alcoholic liver disease: --Elevated AST and total bilirubin noted.  --CT scan shows hepatic steatosis without any acute intra-abdominal findings.  Elevated lactic acidosis: 3.4--1.7Patient will be adequately volume repleted.   Depression --Valerie reports patient has been starving herself and has lost interest in her social life. Has had made comments regarding ending her life to be with her husband -- psychiatry consultation with Valerie Leonard appreciated-started on Celexa 20 mg daily  Folate deficiency --start FA 1 mg po daily  PT to see pt today prior to d/c See detailed TOC note for d/c planning  Procedures: Family communication :Valerie Leonard on the phone today Consults :psych CODE STATUS: full DVT Prophylaxis :lovenox Level of care: Med-Surg Status is: inpatient Dispo: patient is from: Home  Anticipated d/c is to: Home today  Patient currently is  medically stable to d/c.              Difficult to place patient No  overall improving and will d/c after seen by PT today.  CONSULTS OBTAINED:  Treatment Team:  Valerie Amel, MD  DRUG ALLERGIES:  Not on File  DISCHARGE MEDICATIONS:   Allergies as of 07/28/2020   Not on File     Medication List    TAKE these medications   citalopram 20 MG tablet Commonly known as: CELEXA Take 1 tablet (20 mg total) by mouth daily.   folic acid 1 MG tablet Commonly known as: FOLVITE Take 1 tablet (1 mg total) by mouth daily.   multivitamin with minerals Tabs tablet Take 1 tablet by mouth daily.            Durable Medical Equipment  (From admission, onward)         Start     Ordered   07/26/20 1413  For home use only DME Dan Humphreys  rolling  Once       Question Answer Comment  Walker: With 5 Inch Wheels   Patient needs a walker to treat with the following condition Weakness      07/26/20 1413          If you experience worsening of your admission symptoms,  develop shortness of breath, life threatening emergency, suicidal or homicidal thoughts you must seek medical attention immediately by calling 911 or calling your MD immediately  if symptoms less severe.  You Must read complete instructions/literature along with all the possible adverse reactions/side effects for all the Medicines you take and that have been prescribed to you. Take any new Medicines after you have completely understood and accept all the possible adverse reactions/side effects.   Please note  You were cared for by a hospitalist during your hospital stay. If you have any questions about your discharge medications or the care you received while you were in the hospital after you are discharged, you can call the unit and asked to speak with the hospitalist on call if the hospitalist that took care of you is not available. Once you are discharged, your primary care physician will handle any further medical issues. Please note that NO REFILLS for any discharge medications will be authorized once you are discharged, as it is imperative that you return to your primary care physician (or establish a relationship with a primary care physician if you do not have one) for your aftercare needs so that they can reassess your need for medications and monitor your lab values. Today   SUBJECTIVE   No new issues per RN  VITAL SIGNS:  Blood pressure 101/70, pulse 90, temperature 98.1 F (36.7 C), resp. rate 18, height 5\' 1"  (1.549 m), weight 59 kg, SpO2 98 %.  I/O:    Intake/Output Summary (Last 24 hours) at 07/28/2020 0840 Last data filed at 07/28/2020 0544 Gross per 24 hour  Intake --  Output 300 ml  Net -300 ml    PHYSICAL EXAMINATION:  GENERAL:  65 y.o.-year-old patient lying in the bed with no acute distress. Disheveled LUNGS: Normal breath sounds bilaterally, no wheezing, rales,rhonchi or crepitation. No use of accessory muscles of respiration.  CARDIOVASCULAR: S1, S2 normal. No  murmurs, rubs, or gallops.  ABDOMEN: Soft, non-tender, non-distended. Bowel sounds present. No organomegaly or mass.  EXTREMITIES: No pedal edema, cyanosis, or clubbing.  NEUROLOGIC: Grossly non focal Overall weak PSYCHIATRIC:  patient is alert with some baseline confusion SKIN: No obvious rash, lesion, or ulcer.   DATA REVIEW:   CBC  Recent Labs  Lab 07/25/20 0556  WBC 9.7  HGB 11.2*  HCT 32.6*  PLT 295    Chemistries  Recent Labs  Lab 07/25/20 0556 07/26/20 0557 07/26/20 1706  NA 137 134*  --   K 3.4* 3.9  --   CL 101 100  --   CO2 24 25  --   GLUCOSE 80 74  --   BUN 5* <5*  --   CREATININE 0.35* 0.40*  --   CALCIUM 6.8* 7.6*  --   MG  --  1.7 1.6*  AST 49*  --   --   ALT 20  --   --   ALKPHOS 84  --   --   BILITOT 3.3*  --   --     Microbiology Results   Recent Results (from the past 240 hour(s))  SARS CORONAVIRUS 2 (TAT 6-24 HRS) Nasopharyngeal Nasopharyngeal Swab  Status: None   Collection Time: 07/24/20  4:47 PM   Specimen: Nasopharyngeal Swab  Result Value Ref Range Status   SARS Coronavirus 2 NEGATIVE NEGATIVE Final    Comment: (NOTE) SARS-CoV-2 target nucleic acids are NOT DETECTED.  The SARS-CoV-2 RNA is generally detectable in upper and lower respiratory specimens during the acute phase of infection. Negative results do not preclude SARS-CoV-2 infection, do not rule out co-infections with other pathogens, and should not be used as the sole basis for treatment or other patient management decisions. Negative results must be combined with clinical observations, patient history, and epidemiological information. The expected result is Negative.  Fact Sheet for Patients: HairSlick.no  Fact Sheet for Healthcare Providers: quierodirigir.com  This test is not yet approved or cleared by the [place] FDA and  has been authorized for detection and/or diagnosis of SARS-CoV-2 by FDA under an  Emergency Use Authorization (EUA). This EUA will remain  in effect (meaning this test can be used) for the duration of the COVID-19 declaration under Se ction 564(b)(1) of the Act, 21 U.S.C. section 360bbb-3(b)(1), unless the authorization is terminated or revoked sooner.  Performed at [facility], [address], [place], [place] [address]   Blood culture (routine x 2)     Status: None (Preliminary result)   Collection Time: 07/24/20  7:12 PM   Specimen: BLOOD  Result Value Ref Range Status   Specimen Description   Final    BLOOD RIGHT ANTECUBITAL Performed at [facility], [address], [place], [place] [address]    Special Requests   Final    BOTTLES DRAWN AEROBIC AND ANAEROBIC Blood Culture adequate volume Performed at [facility], [address], [place], [place] [address]    Culture  Setup Time   Final    GRAM POSITIVE RODS AEROBIC BOTTLE ONLY CRITICAL RESULT CALLED TO, READ BACK BY AND VERIFIED WITH: LISA KLUTTZ AT 1254 ON 07/26/20 [facility]. Performed at [facility], [address], [place], [place] [address]    Culture   Final    Romie Minus POSITIVE RODS IDENTIFICATION TO FOLLOW Performed at [facility], [address], [place], [place] [address]    Report Status PENDING  Incomplete  Blood culture (routine x 2)     Status: None (Preliminary result)   Collection Time: 07/24/20  7:12 PM   Specimen: BLOOD  Result Value Ref Range Status   Specimen Description BLOOD LEFT ANTECUBITAL  Final   Special Requests   Final    BOTTLES DRAWN AEROBIC AND ANAEROBIC Blood Culture adequate volume   Culture  Setup Time   Final    Organism ID to follow GRAM POSITIVE COCCI AEROBIC BOTTLE ONLY CRITICAL RESULT CALLED TO, READ BACK BY AND VERIFIED WITH: LISA KLUTTZ @1720  ON 07/27/20 [facility] Performed at [facility], [address], [place], [place]    Culture GRAM POSITIVE COCCI  Final   Report Status PENDING  Incomplete  Blood  Culture ID Panel (Reflexed)     Status: None   Collection Time: 07/24/20  7:12 PM  Result Value Ref Range Status   Enterococcus faecalis NOT DETECTED NOT DETECTED Final   Enterococcus Faecium NOT DETECTED NOT DETECTED Final   Listeria monocytogenes NOT DETECTED NOT DETECTED Final   Staphylococcus species NOT DETECTED NOT DETECTED Final   Staphylococcus aureus (BCID) NOT DETECTED NOT DETECTED Final   Staphylococcus epidermidis NOT DETECTED NOT DETECTED Final   Staphylococcus lugdunensis NOT DETECTED NOT DETECTED  Final   Streptococcus species NOT DETECTED NOT DETECTED Final   Streptococcus agalactiae NOT DETECTED NOT DETECTED Final   Streptococcus pneumoniae NOT DETECTED NOT DETECTED Final   Streptococcus pyogenes NOT DETECTED NOT DETECTED Final   A.calcoaceticus-baumannii NOT DETECTED NOT DETECTED Final   Bacteroides fragilis NOT DETECTED NOT DETECTED Final   Enterobacterales NOT DETECTED NOT DETECTED Final   Enterobacter cloacae complex NOT DETECTED NOT DETECTED Final   Escherichia coli NOT DETECTED NOT DETECTED Final   Klebsiella aerogenes NOT DETECTED NOT DETECTED Final   Klebsiella oxytoca NOT DETECTED NOT DETECTED Final   Klebsiella pneumoniae NOT DETECTED NOT DETECTED Final   Proteus species NOT DETECTED NOT DETECTED Final   Salmonella species NOT DETECTED NOT DETECTED Final   Serratia marcescens NOT DETECTED NOT DETECTED Final   Haemophilus influenzae NOT DETECTED NOT DETECTED Final   Neisseria meningitidis NOT DETECTED NOT DETECTED Final   Pseudomonas aeruginosa NOT DETECTED NOT DETECTED Final   Stenotrophomonas maltophilia NOT DETECTED NOT DETECTED Final   Candida albicans NOT DETECTED NOT DETECTED Final   Candida auris NOT DETECTED NOT DETECTED Final   Candida glabrata NOT DETECTED NOT DETECTED Final   Candida krusei NOT DETECTED NOT DETECTED Final   Candida parapsilosis NOT DETECTED NOT DETECTED Final   Candida tropicalis NOT DETECTED NOT DETECTED Final   Cryptococcus  neoformans/gattii NOT DETECTED NOT DETECTED Final    Comment: Performed at [facility], [address] [place], [place] [address]    RADIOLOGY:  No results found.   CODE STATUS:     Code Status Orders  (From admission, onward)         Start     Ordered   07/24/20 1953  Full code  Continuous        07/24/20 1955        Code Status History    This patient has a current code status but no historical code status.   Advance Care Planning Activity       TOTAL TIME TAKING CARE OF THIS PATIENT: *40* minutes.    Enedina Finner M.D  [facility]    CC: Primary care physician; Pcp, No

## 2020-07-28 NOTE — Discharge Instructions (Signed)
Abstain from drinking alcohol °

## 2020-07-28 NOTE — Plan of Care (Signed)
Patient had an uneventful shift. No changes in neurological and neurovascular assessments. No complaints of pain. Vital signs within normal range. Maintained Sinus rhythm on tele monitor. Denies any needs at this time. All Safety measures maintained. Care continues.  Problem: Education: Goal: Knowledge of General Education information will improve Description: Including pain rating scale, medication(s)/side effects and non-pharmacologic comfort measures Outcome: Progressing   Problem: Health Behavior/Discharge Planning: Goal: Ability to manage health-related needs will improve Outcome: Progressing   Problem: Clinical Measurements: Goal: Ability to maintain clinical measurements within normal limits will improve Outcome: Progressing Goal: Will remain free from infection Outcome: Progressing Goal: Diagnostic test results will improve Outcome: Progressing Goal: Respiratory complications will improve Outcome: Progressing Goal: Cardiovascular complication will be avoided Outcome: Progressing   Problem: Activity: Goal: Risk for activity intolerance will decrease Outcome: Progressing   Problem: Nutrition: Goal: Adequate nutrition will be maintained Outcome: Progressing   Problem: Coping: Goal: Level of anxiety will decrease Outcome: Progressing   Problem: Elimination: Goal: Will not experience complications related to bowel motility Outcome: Progressing Goal: Will not experience complications related to urinary retention Outcome: Progressing   Problem: Pain Managment: Goal: General experience of comfort will improve Outcome: Progressing   Problem: Safety: Goal: Ability to remain free from injury will improve Outcome: Progressing   Problem: Skin Integrity: Goal: Risk for impaired skin integrity will decrease Outcome: Progressing

## 2020-07-28 NOTE — Progress Notes (Signed)
Patient discharged home with parents. Went over all discharge instructions with parents and they showed understanding. Took IV's out of right and left AC without complication.

## 2020-07-28 NOTE — Progress Notes (Signed)
Physical Therapy Treatment Patient Details Name: Valerie Leonard MRN: [id number] DOB: 09-Feb-1956 Today's Date: 07/28/2020    History of Present Illness Valerie Leonard is an 65 y.o. female with no significant past medical history except for chronic alcohol abuse and remote history of tobacco smoking, quit several years ago. Patient apparently fell prior to admission and neighbor recommended calling EMS. Patient is poor historian and unable to tell me why she is here.    PT Comments    Pt was long sitting in bed upon arriving. She is lethargic however cooperative and able to perform desired task. Min assist to exit bed, stand, and ambulate with use of RW. Has personal RW and BSC in room. Pt is planning to DC to mothers house later this afternoon. At conclusion of session, pt was in recliner with chair alarm in place.  Recommend 24 hour assistance at DC.    Follow Up Recommendations  Supervision/Assistance - 24 hour;Supervision for mobility/OOB;Other (comment) (Plan is for pt to DC to moms house. Unable to go to SNF due to insurance issues)     Equipment Recommendations  Other (comment) (pt has personal equipment in room)       Precautions / Restrictions Precautions Precautions: Fall Restrictions Weight Bearing Restrictions: No    Mobility  Bed Mobility Overal bed mobility: Needs Assistance Bed Mobility: Supine to Sit     Supine to sit: Min assist     General bed mobility comments: Pt required min assist of one to exit R sid eof bed.    Transfers Overall transfer level: Needs assistance Equipment used: Rolling walker (2 wheeled) Transfers: Sit to/from Stand Sit to Stand: Min assist         General transfer comment: Min assist + moderate vcs for safety to stand from EOB to RW. needs increased time. slight tremors noted throughout session with mobility and transfers  Ambulation/Gait Ambulation/Gait assistance: Min assist Gait Distance (Feet): 5 Feet Assistive device: Rolling  walker (2 wheeled) Gait Pattern/deviations: Staggering left;Staggering right;Shuffle Gait velocity: decreased   General Gait Details: Pt was able to ambulate from EOB to recliner with min assist       Balance Overall balance assessment: Needs assistance Sitting-balance support: Feet supported Sitting balance-Leahy Scale: Fair     Standing balance support: Bilateral upper extremity supported;During functional activity Standing balance-Leahy Scale: Poor Standing balance comment: extremely high fall risk        Cognition Arousal/Alertness: Lethargic Behavior During Therapy: WFL for tasks assessed/performed Overall Cognitive Status: No family/caregiver present to determine baseline cognitive functioning      General Comments: Pt was able to follow simple one step commands with increased time to process. Hx of alcoholism             Pertinent Vitals/Pain Pain Assessment: No/denies pain           PT Goals (current goals can now be found in the care plan section) Acute Rehab PT Goals Patient Stated Goal: none stated Progress towards PT goals: Progressing toward goals    Frequency    Min 2X/week      PT Plan Current plan remains appropriate       AM-PAC PT "6 Clicks" Mobility   Outcome Measure  Help needed turning from your back to your side while in a flat bed without using bedrails?: A Little Help needed moving from lying on your back to sitting on the side of a flat bed without using bedrails?: A Little Help needed moving to and from  a bed to a chair (including a wheelchair)?: A Lot Help needed standing up from a chair using your arms (e.g., wheelchair or bedside chair)?: A Lot Help needed to walk in hospital room?: A Lot Help needed climbing 3-5 steps with a railing? : A Lot 6 Click Score: 14    End of Session Equipment Utilized During Treatment: Gait belt Activity Tolerance: Patient limited by lethargy Patient left: in bed;with bed alarm set;with  nursing/sitter in room Nurse Communication: Mobility status PT Visit Diagnosis: Other abnormalities of gait and mobility (R26.89);History of falling (Z91.81)     Time: 9458-5929 PT Time Calculation (min) (ACUTE ONLY): 14 min  Charges:  $Therapeutic Activity: 8-22 mins                     Jetta Lout PTA 07/28/20, 9:20 AM

## 2020-07-30 LAB — CULTURE, BLOOD (ROUTINE X 2): Special Requests: ADEQUATE

## 2021-01-18 ENCOUNTER — Emergency Department
Admission: EM | Admit: 2021-01-18 | Discharge: 2021-01-18 | Disposition: A | Discharge status: Home / Self Care | Payer: Self-pay | Attending: Emergency Medicine | Admitting: Emergency Medicine

## 2021-01-18 ENCOUNTER — Other Ambulatory Visit: Payer: Self-pay

## 2021-01-18 ENCOUNTER — Emergency Department: Payer: Self-pay

## 2021-01-18 DIAGNOSIS — S50311A Abrasion of right elbow, initial encounter: Secondary | ICD-10-CM | POA: Insufficient documentation

## 2021-01-18 DIAGNOSIS — W01198A Fall on same level from slipping, tripping and stumbling with subsequent striking against other object, initial encounter: Secondary | ICD-10-CM | POA: Insufficient documentation

## 2021-01-18 DIAGNOSIS — Y92511 Restaurant or cafe as the place of occurrence of the external cause: Secondary | ICD-10-CM | POA: Insufficient documentation

## 2021-01-18 DIAGNOSIS — R4182 Altered mental status, unspecified: Secondary | ICD-10-CM | POA: Insufficient documentation

## 2021-01-18 DIAGNOSIS — Y908 Blood alcohol level of 240 mg/100 ml or more: Secondary | ICD-10-CM | POA: Insufficient documentation

## 2021-01-18 DIAGNOSIS — F10920 Alcohol use, unspecified with intoxication, uncomplicated: Secondary | ICD-10-CM

## 2021-01-18 DIAGNOSIS — F10129 Alcohol abuse with intoxication, unspecified: Secondary | ICD-10-CM | POA: Insufficient documentation

## 2021-01-18 DIAGNOSIS — Z87891 Personal history of nicotine dependence: Secondary | ICD-10-CM | POA: Insufficient documentation

## 2021-01-18 LAB — CBC WITH DIFFERENTIAL/PLATELET
Abs Immature Granulocytes: 0.05 10*3/uL (ref 0.00–0.07)
Basophils Absolute: 0.1 10*3/uL (ref 0.0–0.1)
Basophils Relative: 1 %
Eosinophils Absolute: 0.2 10*3/uL (ref 0.0–0.5)
Eosinophils Relative: 1 %
HCT: 40.1 % (ref 36.0–46.0)
Hemoglobin: 13.4 g/dL (ref 12.0–15.0)
Immature Granulocytes: 0 %
Lymphocytes Relative: 53 %
Lymphs Abs: 6 10*3/uL — ABNORMAL HIGH (ref 0.7–4.0)
MCH: 30.9 pg (ref 26.0–34.0)
MCHC: 33.4 g/dL (ref 30.0–36.0)
MCV: 92.6 fL (ref 80.0–100.0)
Monocytes Absolute: 0.9 10*3/uL (ref 0.1–1.0)
Monocytes Relative: 8 %
Neutro Abs: 4.1 10*3/uL (ref 1.7–7.7)
Neutrophils Relative %: 37 %
Platelets: 277 10*3/uL (ref 150–400)
RBC: 4.33 MIL/uL (ref 3.87–5.11)
RDW: 12.5 % (ref 11.5–15.5)
WBC: 11.1 10*3/uL — ABNORMAL HIGH (ref 4.0–10.5)
nRBC: 0 % (ref 0.0–0.2)

## 2021-01-18 LAB — COMPREHENSIVE METABOLIC PANEL
ALT: 12 U/L (ref 0–44)
AST: 27 U/L (ref 15–41)
Albumin: 3.8 g/dL (ref 3.5–5.0)
Alkaline Phosphatase: 77 U/L (ref 38–126)
Anion gap: 8 (ref 5–15)
BUN: 8 mg/dL (ref 8–23)
CO2: 25 mmol/L (ref 22–32)
Calcium: 9.2 mg/dL (ref 8.9–10.3)
Chloride: 105 mmol/L (ref 98–111)
Creatinine, Ser: 0.61 mg/dL (ref 0.44–1.00)
GFR, Estimated: >60 mL/min (ref >60)
Glucose, Bld: 92 mg/dL (ref 70–99)
Potassium: 4.3 mmol/L (ref 3.5–5.1)
Sodium: 138 mmol/L (ref 135–145)
Total Bilirubin: 0.6 mg/dL (ref 0.3–1.2)
Total Protein: 7.6 g/dL (ref 6.5–8.1)

## 2021-01-18 LAB — MAGNESIUM: Magnesium: 1.9 mg/dL (ref 1.7–2.4)

## 2021-01-18 LAB — ETHANOL: Alcohol, Ethyl (B): 330 mg/dL (ref <10)

## 2021-01-18 MED ORDER — SODIUM CHLORIDE 0.9 % IV BOLUS
1000.0000 mL | Freq: Once | INTRAVENOUS | Status: AC
  Administered 2021-01-18: 1000 mL via INTRAVENOUS

## 2021-01-18 NOTE — ED Triage Notes (Signed)
Pt presents to ER via ems from a restaurant.  Per ems, pt was drinking at the hospital with her parents, and fell while trying to get up.  Pt does not remember what happened.  Unknown if pt hit head or not.  Pt A&O x2 at this time.  Pt unable to stand on her own.  Pt has small skin tear to right elbow but no other injuries noted at this time.

## 2021-01-18 NOTE — ED Provider Notes (Signed)
[facility] Emergency Department Provider Note   ____________________________________________   Event Date/Time   First MD Initiated Contact with Patient 01/18/21 1938     (approximate)  I have reviewed the triage vital signs and the nursing notes.   HISTORY  Chief Complaint Alcohol Intoxication and Fall    HPI Valerie Leonard is a 65 y.o. female with past medical history of alcohol abuse and depression who presents to the ED for altered mental status.  History is limited as EMS reports patient is currently intoxicated.  Per EMS, patient had 3 glasses of wine earlier this evening and was out to dinner with family when she was acting erratically.  Per EMS, patient deals with some confusion at baseline but family was concerned that this was worse than usual.  She had also fallen and hit her head on the wall at the restaurant.  Patient states she does not remember falling and is not sure why she is here.        History reviewed. No pertinent past medical history.  Patient Active Problem List   Diagnosis Date Noted   Severe major depression, single episode, without psychotic features (HCC) 07/26/2020   Depression    Acute delirium 07/25/2020   Acute metabolic encephalopathy    Hypomagnesemia    Chronic alcohol abuse    Hypokalemia 07/24/2020    History reviewed. No pertinent surgical history.  Prior to Admission medications   Medication Sig Start Date End Date Taking? Authorizing Provider  citalopram (CELEXA) 20 MG tablet Take 1 tablet (20 mg total) by mouth daily. 07/28/20   Enedina Finner, MD  folic acid (FOLVITE) 1 MG tablet Take 1 tablet (1 mg total) by mouth daily. 07/28/20   Enedina Finner, MD  Multiple Vitamin (MULTIVITAMIN WITH MINERALS) TABS tablet Take 1 tablet by mouth daily. 07/28/20   Enedina Finner, MD    Allergies Patient has no known allergies.  History reviewed. No pertinent family history.  Social History Social History   Tobacco Use    Smoking status: Former    Types: Cigarettes, Cigars   Smokeless tobacco: Never  Vaping Use   Vaping Use: Never used  Substance Use Topics   Alcohol use: Yes    Comment: More than 4 glasses of wine, mixed drinks, beer daily.    Drug use: Not Currently    Comment: former use of marijuana    Review of Systems  Constitutional: No fever/chills.  Positive for fall. Eyes: No visual changes. ENT: No sore throat. Cardiovascular: Denies chest pain. Respiratory: Denies shortness of breath. Gastrointestinal: No abdominal pain.  No nausea, no vomiting.  No diarrhea.  No constipation. Genitourinary: Negative for dysuria. Musculoskeletal: Negative for back pain. Skin: Negative for rash. Neurological: Negative for headaches, focal weakness or numbness.  ____________________________________________   PHYSICAL EXAM:  VITAL SIGNS: ED Triage Vitals  Enc Vitals Group     BP      Pulse      Resp      Temp      Temp src      SpO2      Weight      Height      Head Circumference      Peak Flow      Pain Score      Pain Loc      Pain Edu?      Excl. in GC?     Constitutional: Alert and oriented.  Intoxicated appearing. Eyes: Conjunctivae are normal. Head:  Atraumatic. Nose: No congestion/rhinnorhea. Mouth/Throat: Mucous membranes are moist. Neck: Normal ROM Cardiovascular: Normal rate, regular rhythm. Grossly normal heart sounds.  2+ radial pulses bilaterally. Respiratory: Normal respiratory effort.  No retractions. Lungs CTAB. Gastrointestinal: Soft and nontender. No distention. Genitourinary: deferred Musculoskeletal: No lower extremity tenderness nor edema.  Abrasion to right elbow with no bony tenderness and range of motion intact without pain. Neurologic:  Normal speech and language. No gross focal neurologic deficits are appreciated. Skin:  Skin is warm, dry and intact. No rash noted. Psychiatric: Mood and affect are normal. Speech and behavior are  normal.  ____________________________________________   LABS (all labs ordered are listed, but only abnormal results are displayed)  Labs Reviewed  CBC WITH DIFFERENTIAL/PLATELET - Abnormal; Notable for the following components:      Result Value   WBC 11.1 (*)    Lymphs Abs 6.0 (*)    All other components within normal limits  ETHANOL - Abnormal; Notable for the following components:   Alcohol, Ethyl (B) 330 (*)    All other components within normal limits  COMPREHENSIVE METABOLIC PANEL  MAGNESIUM  URINALYSIS, COMPLETE (UACMP) WITH MICROSCOPIC  URINE DRUG SCREEN, QUALITATIVE ([facility] ONLY)   ____________________________________________  EKG  ED ECG REPORT I, Chesley Noon, the attending physician, personally viewed and interpreted this ECG.   Date: 01/18/2021  EKG Time: 21:49  Rate: 85  Rhythm: normal sinus rhythm  Axis: LAD  Intervals:none  ST&T Change: None   PROCEDURES  Procedure(s) performed (including Critical Care):  Procedures   ____________________________________________   INITIAL IMPRESSION / ASSESSMENT AND PLAN / ED COURSE      65 year old female with past medical history of alcohol abuse and depression who presents to the ED due to concern for altered mental status and fall at a restaurant.  Patient admits to alcohol consumption this evening, does not remember falling.  She does have an abrasion to her right elbow but there is no evidence of bony injury on exam, range of motion intact without pain.  CT head and cervical spine were performed and negative for acute process.  Labs remarkable for elevated blood alcohol level but otherwise reassuring, no electrolyte abnormality.  Sister is now at bedside and is able to provide safe ride home, patient appropriate for discharge home to the care of her sister.      ____________________________________________   FINAL CLINICAL IMPRESSION(S) / ED DIAGNOSES  Final diagnoses:  Alcoholic intoxication  without complication [facility])     ED Discharge Orders     None        Note:  This document was prepared using Dragon voice recognition software and may include unintentional dictation errors.    Chesley Noon, MD 01/18/21 2203

## 2022-04-24 IMAGING — CT CT HEAD W/O CM
3 series · 15 of 46 positions shown, 18 images · non-contrast
Comparison: CT head and cervical spine 07/24/2020

CLINICAL DATA: fell while trying to get up.

EXAM:
CT HEAD WITHOUT CONTRAST
CT CERVICAL SPINE WITHOUT CONTRAST
TECHNIQUE: Multidetector CT imaging of the head and cervical spine was
performed following the standard protocol without intravenous
contrast. Multiplanar CT image reconstructions of the cervical spine
were also generated.

[Series 2: head wo · axial · 0.41mm/px · z∈[+243,+363]mm · 9 of 29 slices shown, 12 images]
[im 3/29  brain]
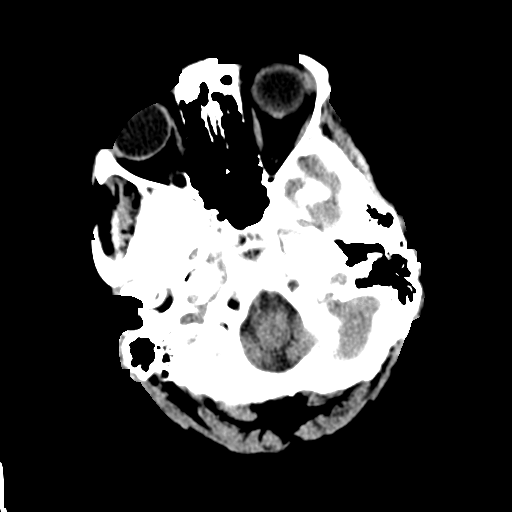
[im 3/29  bone]
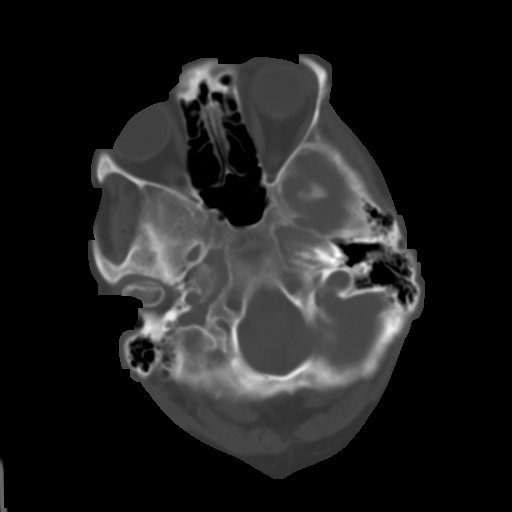
[im 6/29  brain]
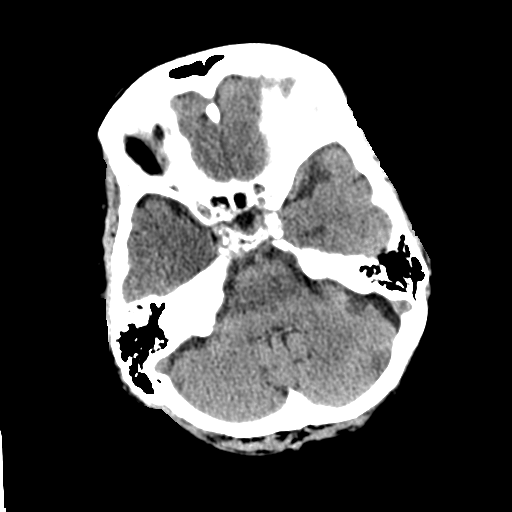
[im 9/29  brain]
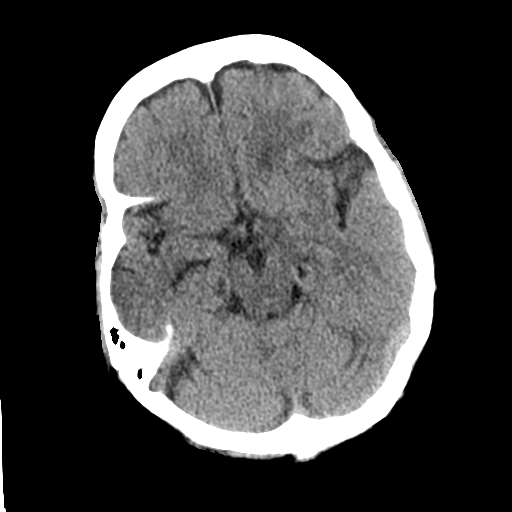
[im 12/29  brain]
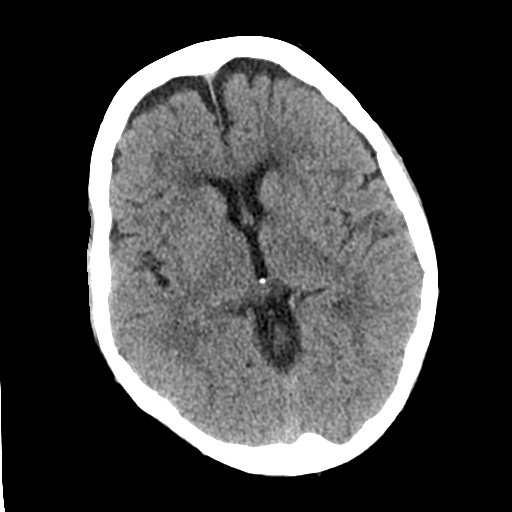
[im 15/29  brain]
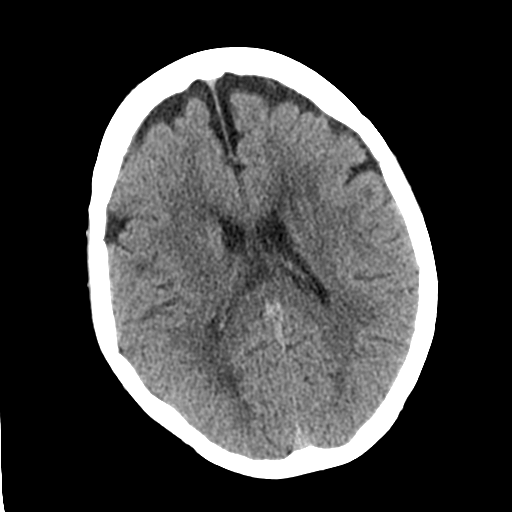
[im 15/29  bone]
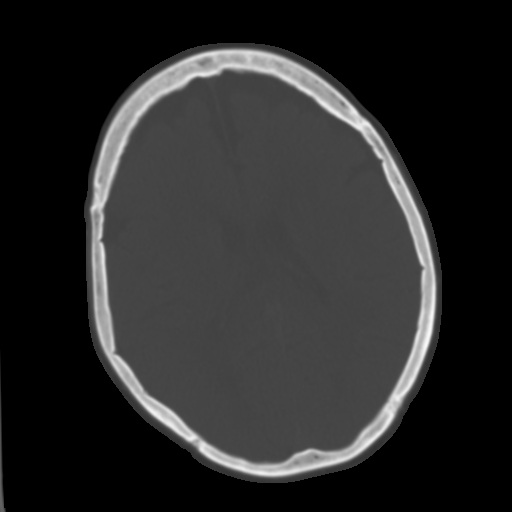
[im 18/29  brain]
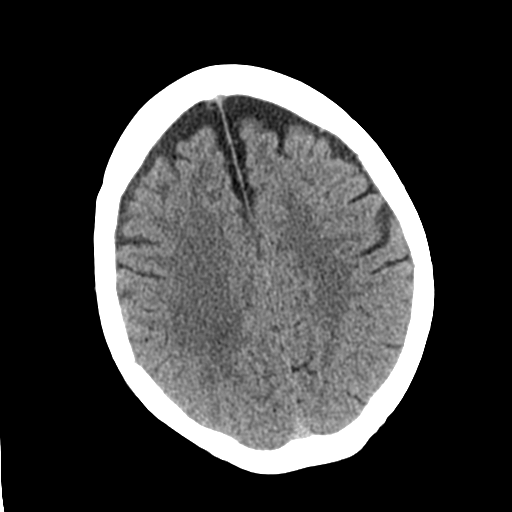
[im 21/29  brain]
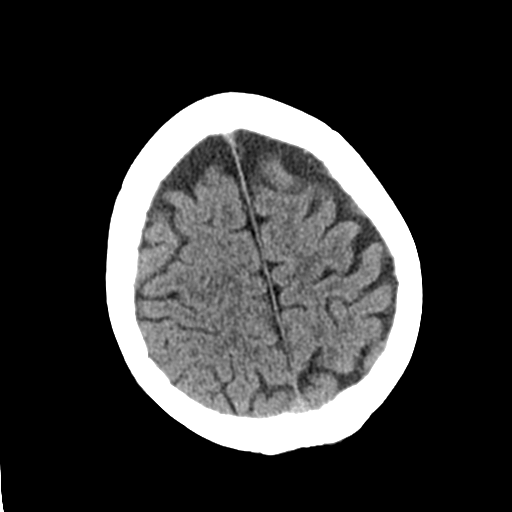
[im 24/29  brain]
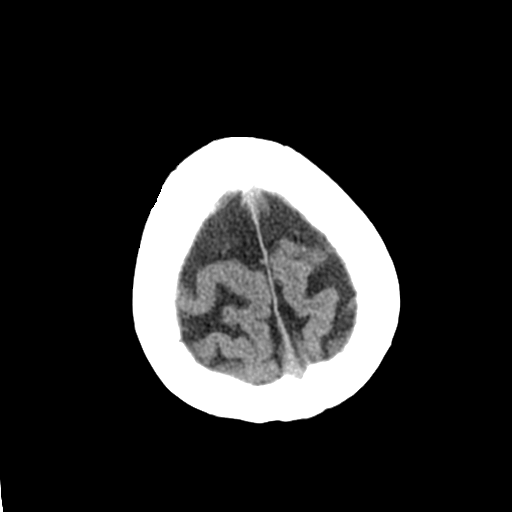
[im 27/29  brain]
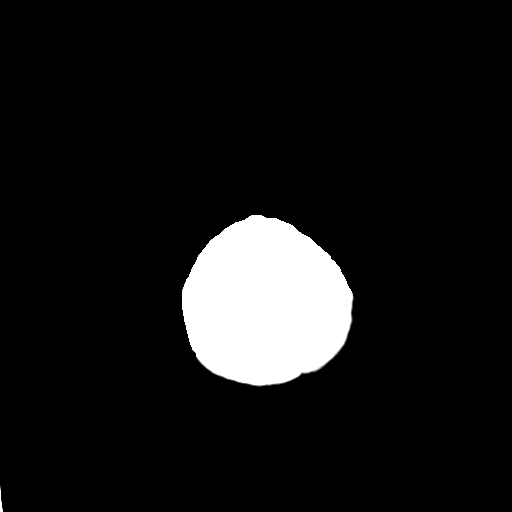
[im 27/29  bone]
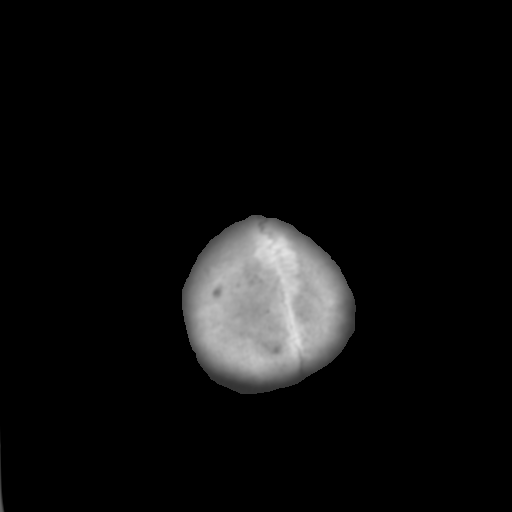

[Series 4: coronal soft tissue · coronal · 0.29mm/px · 3 of 62 slices shown]
[im 21/62  brain]
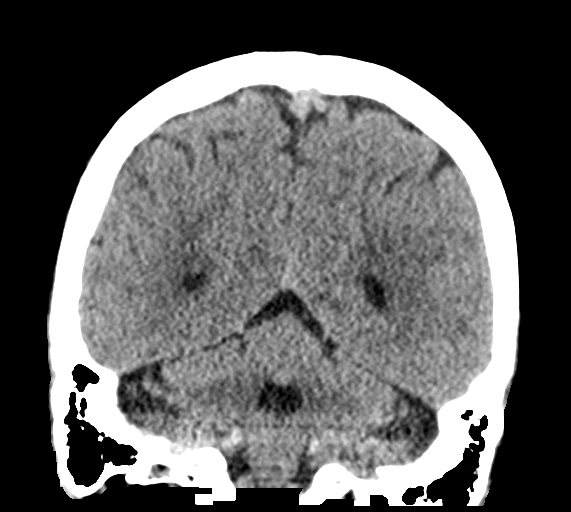
[im 28/62  brain]
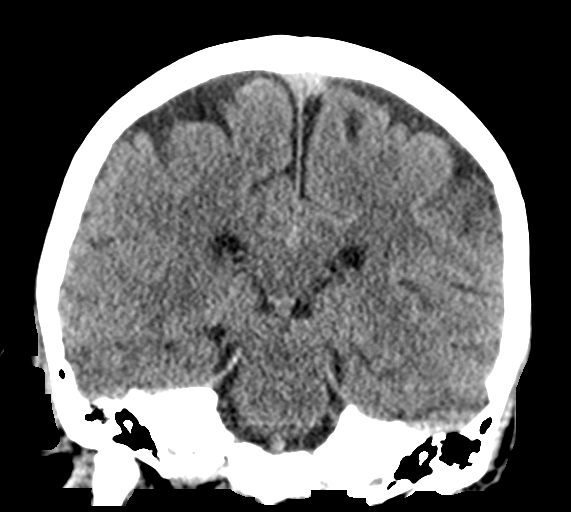
[im 34/62  brain]
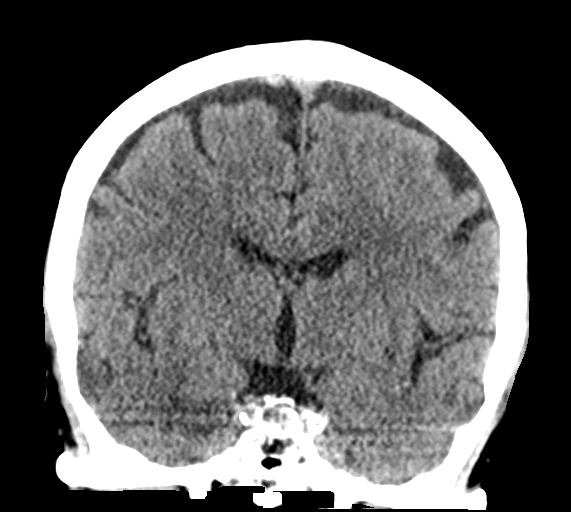

[Series 5: sagittal soft tissue · sagittal · 0.30mm/px · 3 of 52 slices shown]
[im 18/52  brain]
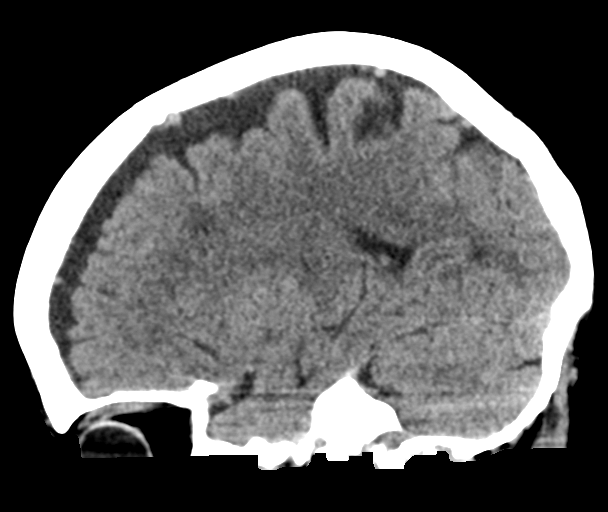
[im 26/52  brain]
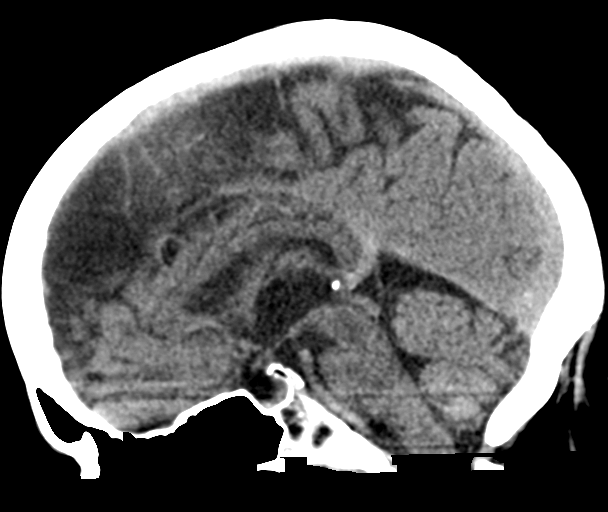
[im 35/52  brain]
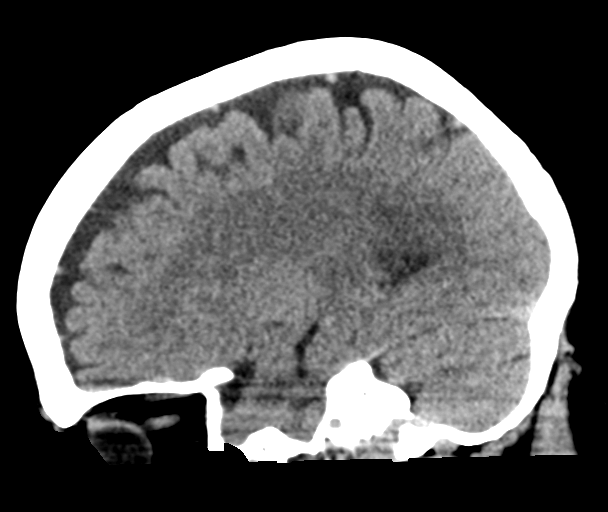

[15 of 46 positions shown; findings below may reference images not displayed]

FINDINGS: CT HEAD FINDINGS

BRAIN:
BRAIN
Cerebral ventricle sizes are concordant with the degree of cerebral
volume loss. Patchy and confluent areas of decreased attenuation are
noted throughout the deep and periventricular white matter of the
cerebral hemispheres bilaterally, compatible with chronic
microvascular ischemic disease.

No evidence of large-territorial acute infarction. No parenchymal
hemorrhage. No mass lesion. No extra-axial collection.

No mass effect or midline shift. No hydrocephalus. Basilar cisterns
are patent.

Vascular: No hyperdense vessel. Atherosclerotic calcifications are
present within the cavernous internal carotid arteries.

Skull: No acute fracture or focal lesion.

Sinuses/Orbits: Paranasal sinuses and mastoid air cells are clear.
The orbits are unremarkable.

Other: None.

CT CERVICAL SPINE FINDINGS

Alignment: Reversal of the normal cervical lordosis centered at the
C5 level likely due to positioning.

Skull base and vertebrae: Multilevel degenerative changes of the
spine. No associated severe osseous neural foraminal or osseous
central canal stenosis. No acute fracture. No aggressive appearing
focal osseous lesion or focal pathologic process.

Soft tissues and spinal canal: No prevertebral fluid or swelling. No
visible canal hematoma.

Upper chest: Unremarkable.

Other: None.
IMPRESSION: 1. No acute intracranial abnormality.
2. No acute displaced fracture or traumatic listhesis of the
cervical spine.

## 2022-04-24 IMAGING — CT CT CERVICAL SPINE W/O CM
3 of 4 series · 12 of 33 positions shown, 14 images · non-contrast
Comparison: CT head and cervical spine 07/24/2020

CLINICAL DATA: fell while trying to get up.

EXAM:
CT HEAD WITHOUT CONTRAST
CT CERVICAL SPINE WITHOUT CONTRAST
TECHNIQUE: Multidetector CT imaging of the head and cervical spine was
performed following the standard protocol without intravenous
contrast. Multiplanar CT image reconstructions of the cervical spine
were also generated.

[Series 4: sagittal bone · sagittal · 0.25mm/px · 5 of 54 slices shown, 6 images]
[im 18/54  bone]
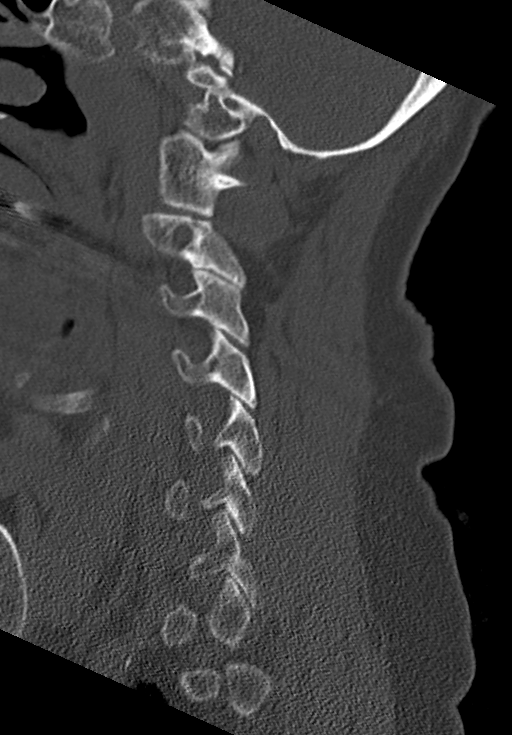
[im 23/54  bone]
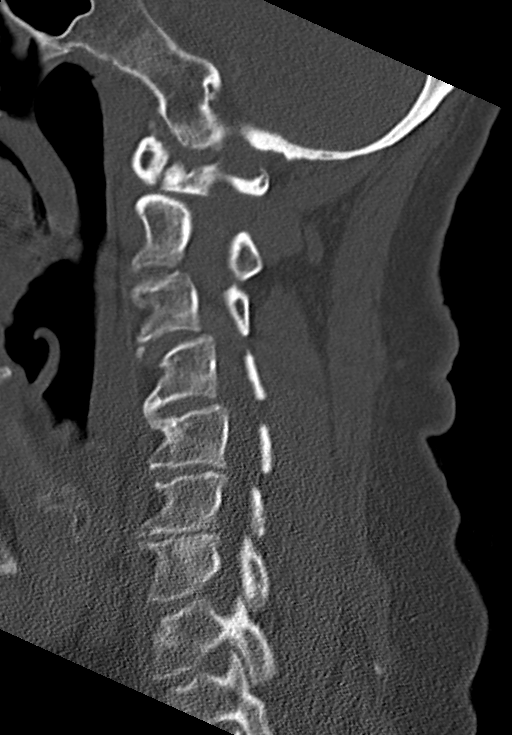
[im 27/54  soft-tissue]
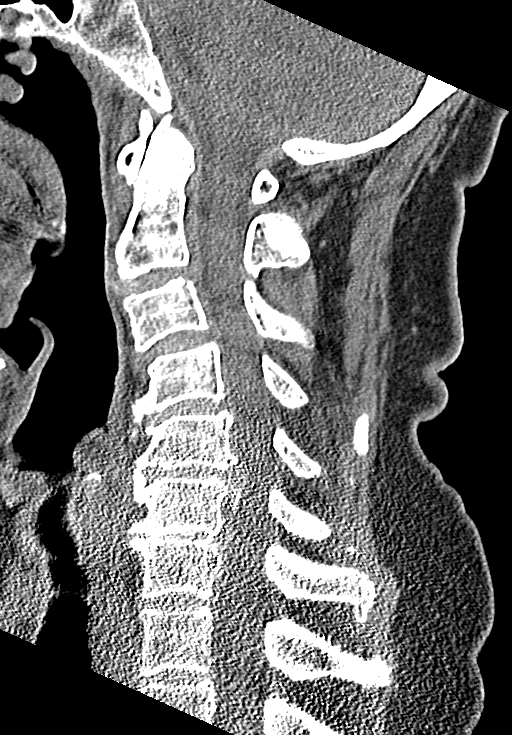
[im 27/54  bone]
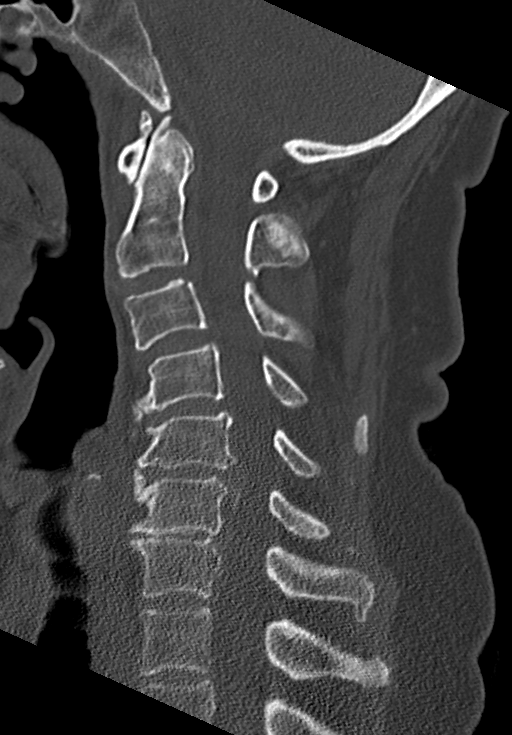
[im 31/54  bone]
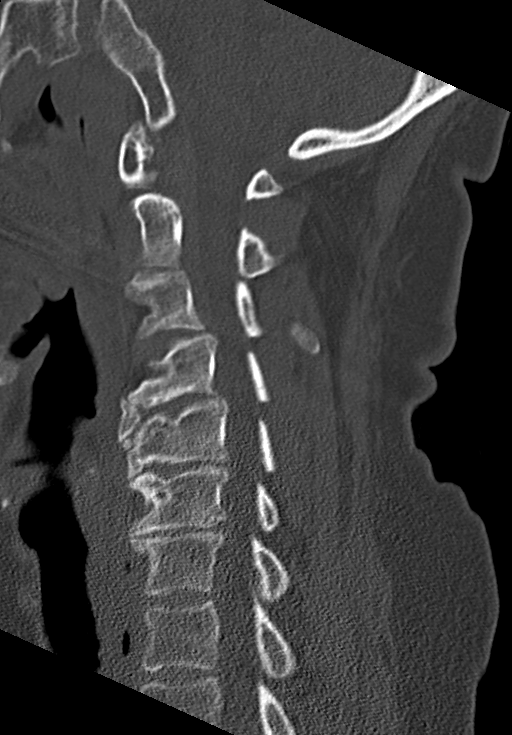
[im 36/54  bone]
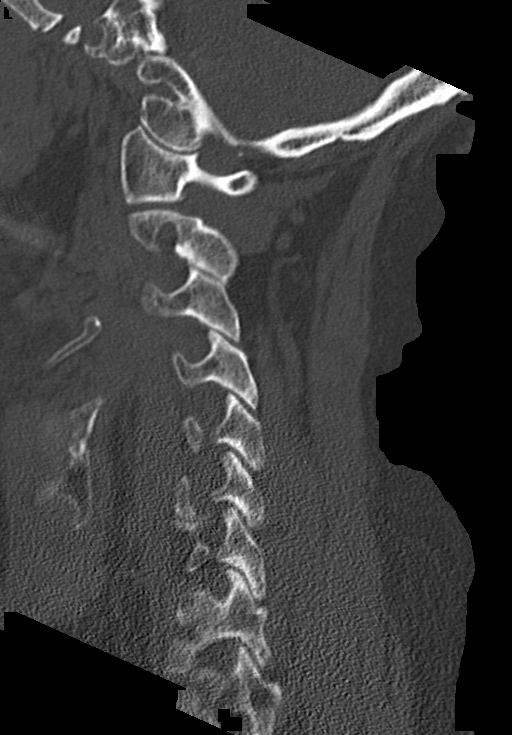

[Series 5: coronal bone · coronal · 0.25mm/px · 3 of 50 slices shown]
[im 10/50  bone]
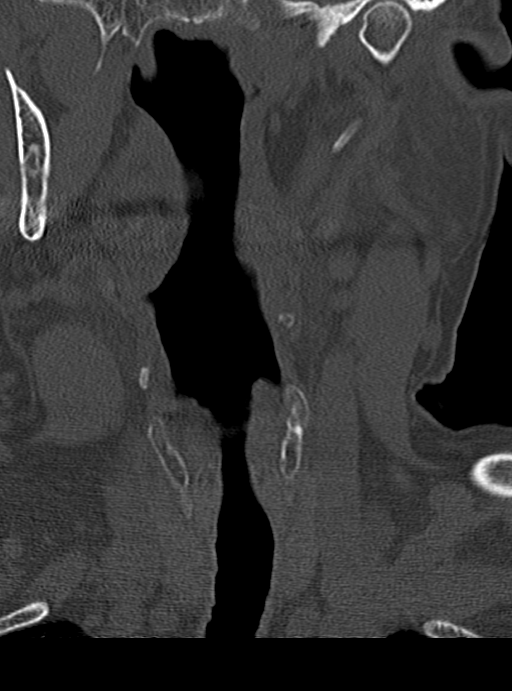
[im 20/50  bone]
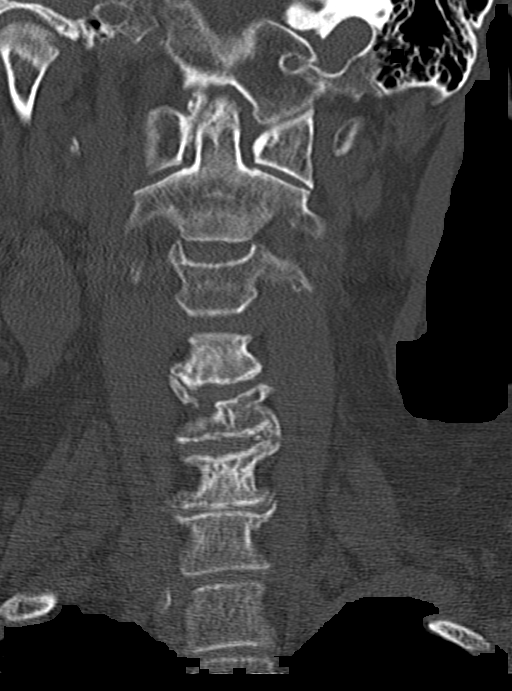
[im 30/50  bone]
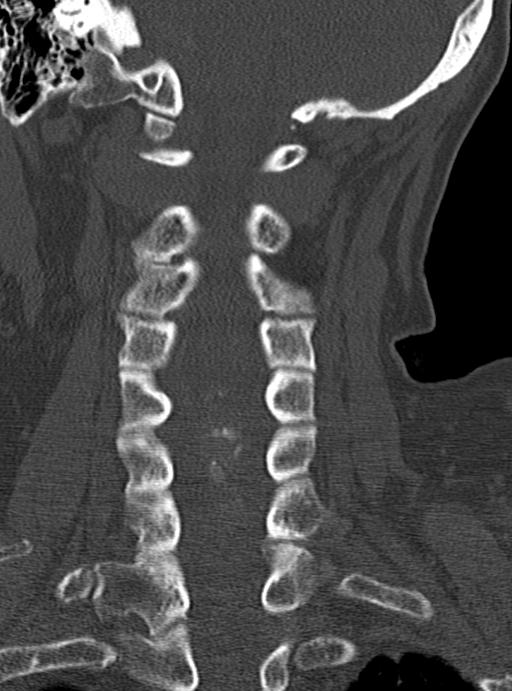

[Series 6: orthogonal bone · axial · 0.23mm/px · z∈[+97,+203]mm · 4 of 87 slices shown, 5 images]
[im 15/87  soft-tissue]
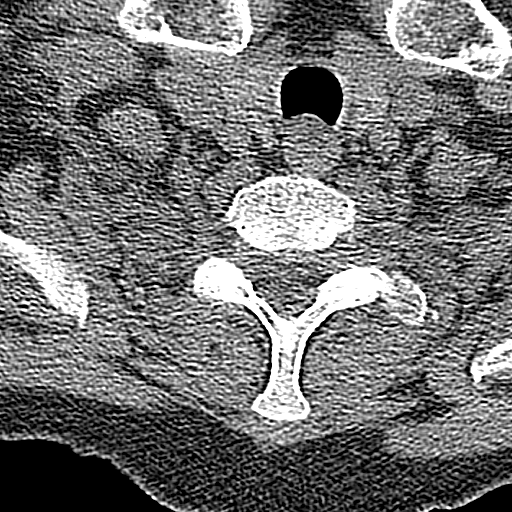
[im 15/87  bone]
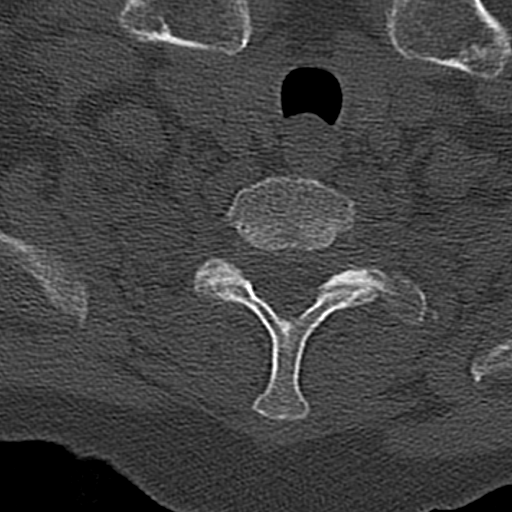
[im 29/87  bone]
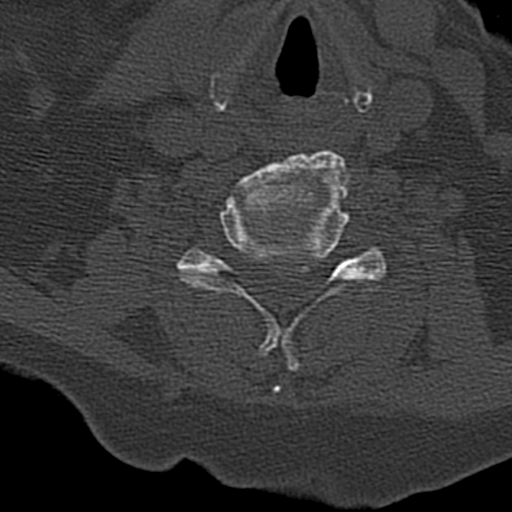
[im 58/87  bone]
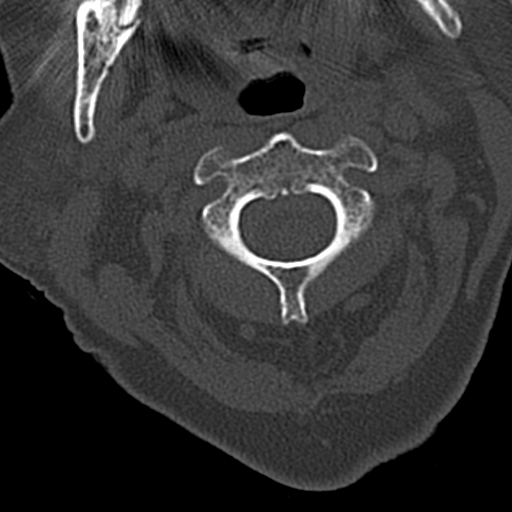
[im 72/87  bone]
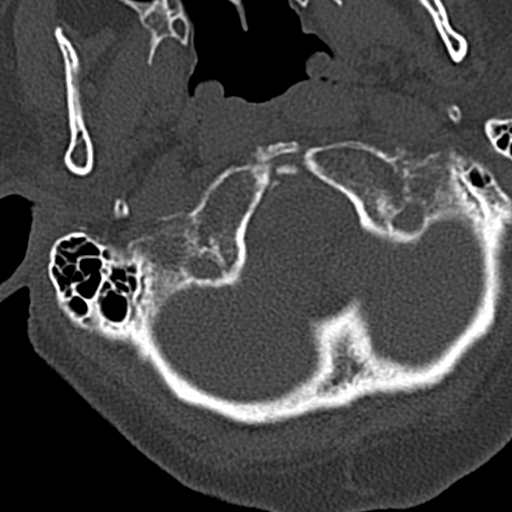

[12 of 33 positions shown; findings below may reference images not displayed]

FINDINGS: CT HEAD FINDINGS

BRAIN:
BRAIN
Cerebral ventricle sizes are concordant with the degree of cerebral
volume loss. Patchy and confluent areas of decreased attenuation are
noted throughout the deep and periventricular white matter of the
cerebral hemispheres bilaterally, compatible with chronic
microvascular ischemic disease.

No evidence of large-territorial acute infarction. No parenchymal
hemorrhage. No mass lesion. No extra-axial collection.

No mass effect or midline shift. No hydrocephalus. Basilar cisterns
are patent.

Vascular: No hyperdense vessel. Atherosclerotic calcifications are
present within the cavernous internal carotid arteries.

Skull: No acute fracture or focal lesion.

Sinuses/Orbits: Paranasal sinuses and mastoid air cells are clear.
The orbits are unremarkable.

Other: None.

CT CERVICAL SPINE FINDINGS

Alignment: Reversal of the normal cervical lordosis centered at the
C5 level likely due to positioning.

Skull base and vertebrae: Multilevel degenerative changes of the
spine. No associated severe osseous neural foraminal or osseous
central canal stenosis. No acute fracture. No aggressive appearing
focal osseous lesion or focal pathologic process.

Soft tissues and spinal canal: No prevertebral fluid or swelling. No
visible canal hematoma.

Upper chest: Unremarkable.

Other: None.
IMPRESSION: 1. No acute intracranial abnormality.
2. No acute displaced fracture or traumatic listhesis of the
cervical spine.

## 2023-09-27 ENCOUNTER — Other Ambulatory Visit: Payer: Self-pay

## 2023-09-27 ENCOUNTER — Emergency Department

## 2023-09-27 ENCOUNTER — Encounter: Payer: Self-pay | Admitting: Emergency Medicine

## 2023-09-27 ENCOUNTER — Inpatient Hospital Stay
Admission: EM | Admit: 2023-09-27 | Discharge: 2023-09-29 | DRG: 812 | Disposition: A | Discharge status: Home / Self Care | Attending: Internal Medicine | Admitting: Internal Medicine

## 2023-09-27 DIAGNOSIS — E871 Hypo-osmolality and hyponatremia: Secondary | ICD-10-CM | POA: Diagnosis present

## 2023-09-27 DIAGNOSIS — Z87891 Personal history of nicotine dependence: Secondary | ICD-10-CM

## 2023-09-27 DIAGNOSIS — N939 Abnormal uterine and vaginal bleeding, unspecified: Secondary | ICD-10-CM | POA: Diagnosis not present

## 2023-09-27 DIAGNOSIS — Z79899 Other long term (current) drug therapy: Secondary | ICD-10-CM

## 2023-09-27 DIAGNOSIS — D72829 Elevated white blood cell count, unspecified: Secondary | ICD-10-CM | POA: Diagnosis present

## 2023-09-27 DIAGNOSIS — L8990 Pressure ulcer of unspecified site, unspecified stage: Secondary | ICD-10-CM | POA: Insufficient documentation

## 2023-09-27 DIAGNOSIS — D649 Anemia, unspecified: Principal | ICD-10-CM

## 2023-09-27 DIAGNOSIS — F329 Major depressive disorder, single episode, unspecified: Secondary | ICD-10-CM | POA: Diagnosis present

## 2023-09-27 DIAGNOSIS — N938 Other specified abnormal uterine and vaginal bleeding: Secondary | ICD-10-CM | POA: Diagnosis present

## 2023-09-27 DIAGNOSIS — D62 Acute posthemorrhagic anemia: Principal | ICD-10-CM | POA: Diagnosis present

## 2023-09-27 LAB — COMPREHENSIVE METABOLIC PANEL WITH GFR
ALT: 9 U/L (ref 0–44)
AST: 25 U/L (ref 15–41)
Albumin: 2.6 g/dL — ABNORMAL LOW (ref 3.5–5.0)
Alkaline Phosphatase: 52 U/L (ref 38–126)
Anion gap: 10 (ref 5–15)
BUN: 11 mg/dL (ref 8–23)
CO2: 23 mmol/L (ref 22–32)
Calcium: 8.1 mg/dL — ABNORMAL LOW (ref 8.9–10.3)
Chloride: 100 mmol/L (ref 98–111)
Creatinine, Ser: 0.65 mg/dL (ref 0.44–1.00)
GFR, Estimated: >60 mL/min (ref >60)
Glucose, Bld: 101 mg/dL — ABNORMAL HIGH (ref 70–99)
Potassium: 3.7 mmol/L (ref 3.5–5.1)
Sodium: 133 mmol/L — ABNORMAL LOW (ref 135–145)
Total Bilirubin: 0.6 mg/dL (ref 0.0–1.2)
Total Protein: 6.5 g/dL (ref 6.5–8.1)

## 2023-09-27 LAB — PREPARE RBC (CROSSMATCH)

## 2023-09-27 LAB — CBC WITH DIFFERENTIAL/PLATELET
Abs Immature Granulocytes: 0.11 10*3/uL — ABNORMAL HIGH (ref 0.00–0.07)
Basophils Absolute: 0.1 10*3/uL (ref 0.0–0.1)
Basophils Relative: 1 %
Eosinophils Absolute: 0 10*3/uL (ref 0.0–0.5)
Eosinophils Relative: 0 %
HCT: 18.2 % — ABNORMAL LOW (ref 36.0–46.0)
Hemoglobin: 5.8 g/dL — ABNORMAL LOW (ref 12.0–15.0)
Immature Granulocytes: 1 %
Lymphocytes Relative: 18 %
Lymphs Abs: 2.3 10*3/uL (ref 0.7–4.0)
MCH: 27.8 pg (ref 26.0–34.0)
MCHC: 31.9 g/dL (ref 30.0–36.0)
MCV: 87.1 fL (ref 80.0–100.0)
Monocytes Absolute: 1.2 10*3/uL — ABNORMAL HIGH (ref 0.1–1.0)
Monocytes Relative: 9 %
Neutro Abs: 9.6 10*3/uL — ABNORMAL HIGH (ref 1.7–7.7)
Neutrophils Relative %: 71 %
Platelets: 489 10*3/uL — ABNORMAL HIGH (ref 150–400)
RBC: 2.09 MIL/uL — ABNORMAL LOW (ref 3.87–5.11)
RDW: 14.7 % (ref 11.5–15.5)
WBC: 13.3 10*3/uL — ABNORMAL HIGH (ref 4.0–10.5)
nRBC: 0 % (ref 0.0–0.2)

## 2023-09-27 LAB — PROTIME-INR
INR: 1.3 — ABNORMAL HIGH (ref 0.8–1.2)
Prothrombin Time: 16 s — ABNORMAL HIGH (ref 11.4–15.2)

## 2023-09-27 LAB — TROPONIN I (HIGH SENSITIVITY)
Troponin I (High Sensitivity): 3 ng/L (ref <18)
Troponin I (High Sensitivity): 2 ng/L (ref <18)

## 2023-09-27 LAB — ABO/RH: ABO/RH(D): O POS

## 2023-09-27 LAB — HIV ANTIBODY (ROUTINE TESTING W REFLEX): HIV Screen 4th Generation wRfx: NONREACTIVE

## 2023-09-27 LAB — LIPASE, BLOOD: Lipase: 20 U/L (ref 11–51)

## 2023-09-27 MED ORDER — ONDANSETRON HCL 4 MG/2ML IJ SOLN: 4.0000 mg | Freq: Four times a day (QID) | INTRAMUSCULAR | Status: DC | PRN

## 2023-09-27 MED ORDER — ENSURE PLUS HIGH PROTEIN PO LIQD
237.0000 mL | Freq: Two times a day (BID) | ORAL | Status: DC
  Administered 2023-09-28 – 2023-09-29 (×3): 237 mL via ORAL

## 2023-09-27 MED ORDER — TRANEXAMIC ACID-NACL 1000-0.7 MG/100ML-% IV SOLN
1000.0000 mg | INTRAVENOUS | Status: AC
  Administered 2023-09-27: 1000 mg via INTRAVENOUS
  Filled 2023-09-27: qty 100

## 2023-09-27 MED ORDER — SODIUM CHLORIDE 0.9 % IV SOLN: INTRAVENOUS | Status: AC

## 2023-09-27 MED ORDER — ACETAMINOPHEN 325 MG PO TABS
650.0000 mg | ORAL_TABLET | Freq: Four times a day (QID) | ORAL | Status: DC | PRN
  Administered 2023-09-28: 650 mg via ORAL
  Filled 2023-09-27: qty 2

## 2023-09-27 MED ORDER — MELATONIN 5 MG PO TABS
10.0000 mg | ORAL_TABLET | Freq: Every day | ORAL | Status: DC
  Administered 2023-09-27 – 2023-09-28 (×2): 10 mg via ORAL
  Filled 2023-09-27 (×2): qty 2

## 2023-09-27 MED ORDER — HYDROXYZINE HCL 25 MG PO TABS
25.0000 mg | ORAL_TABLET | Freq: Three times a day (TID) | ORAL | Status: DC | PRN
  Administered 2023-09-27 – 2023-09-28 (×2): 25 mg via ORAL
  Filled 2023-09-27 (×3): qty 1

## 2023-09-27 MED ORDER — SODIUM CHLORIDE 0.9% IV SOLUTION
Freq: Once | INTRAVENOUS | Status: AC
  Filled 2023-09-27: qty 250

## 2023-09-27 NOTE — ED Notes (Signed)
 Pt reported to this nurse I did use a dildo 3 days ago and noticed red blood when I took it out.

## 2023-09-27 NOTE — ED Triage Notes (Signed)
 Pt from home via [facility] with reports of chest pain that has resolved. Pt BP 94 systolic. Pt was given 500cc NaCl en route. Pt also reports depression and poor oral intake.

## 2023-09-27 NOTE — ED Provider Notes (Signed)
 [facility] Provider Note    Event Date/Time   First MD Initiated Contact with Patient 09/27/23 1357     (approximate)   History   Chest Pain  Pt from home via [facility] with reports of chest pain that has resolved. Pt BP 94 systolic. Pt was given 500cc NaCl en route. Pt also reports depression and poor oral intake.    HPI Valerie Leonard is a 68 y.o. female PMH MDD, chronic AUD presents for evaluation of multiple vague complaints -Patient tells me she was sitting in her bed today and felt lightheaded preventing her from being able to get up.  Also noted a vague possible chest discomfort and nausea.  No shortness of breath.  Currently asymptomatic. -Somewhat limited historian     Physical Exam   Triage Vital Signs: ED Triage Vitals  Encounter Vitals Group     BP 09/27/23 1400 97/63     Girls Systolic BP Percentile --      Girls Diastolic BP Percentile --      Boys Systolic BP Percentile --      Boys Diastolic BP Percentile --      Pulse Rate 09/27/23 1400 100     Resp 09/27/23 1400 16     Temp 09/27/23 1400 98 F (36.7 C)     Temp src --      SpO2 09/27/23 1400 100 %     Weight --      Height --      Head Circumference --      Peak Flow --      Pain Score 09/27/23 1358 0     Pain Loc --      Pain Education --      Exclude from Growth Chart --     Most recent vital signs: Vitals:   09/27/23 1500 09/27/23 1535  BP: (!) 106/58 (!) 104/57  Pulse: (!) 118 (!) 110  Resp: 18 13  Temp:    SpO2: 100% 100%     General: Awake, no distress.  CV:  Good peripheral perfusion. RRR, RP 2+ Resp:  Normal effort. CTAB Abd:  No distention. Nontender to deep palpation throughout Neuro:  Aox4, CN II-XII intact, FNF wnl, finger taps fast b/l, 5/5 strength in bilateral finger extension/grip, arm flexion/extension, EHL/FHL. BUE AG 10+ sec no drift, BLE AG 5+ sec no drift. SILT.    ED Results / Procedures / Treatments   Labs (all labs ordered are listed,  but only abnormal results are displayed) Labs Reviewed  COMPREHENSIVE METABOLIC PANEL WITH GFR - Abnormal; Notable for the following components:      Result Value   Sodium 133 (*)    Glucose, Bld 101 (*)    Calcium  8.1 (*)    Albumin 2.6 (*)    All other components within normal limits  CBC WITH DIFFERENTIAL/PLATELET - Abnormal; Notable for the following components:   WBC 13.3 (*)    RBC 2.09 (*)    Hemoglobin 5.8 (*)    HCT 18.2 (*)    Platelets 489 (*)    Neutro Abs 9.6 (*)    Monocytes Absolute 1.2 (*)    Abs Immature Granulocytes 0.11 (*)    All other components within normal limits  PROTIME-INR - Abnormal; Notable for the following components:   Prothrombin Time 16.0 (*)    INR 1.3 (*)    All other components within normal limits  LIPASE, BLOOD  TYPE AND SCREEN  PREPARE RBC (  CROSSMATCH)  ABO/RH  PREPARE RBC (CROSSMATCH)  TROPONIN I (HIGH SENSITIVITY)  TROPONIN I (HIGH SENSITIVITY)     EKG  N/a   RADIOLOGY pending    PROCEDURES:  Critical Care performed: Yes, see critical care procedure note(s)  .Critical Care  Performed by: Clarine Ozell LABOR, MD Authorized by: Clarine Ozell LABOR, MD   Critical care provider statement:    Critical care time (minutes):  30   Critical care was necessary to treat or prevent imminent or life-threatening deterioration of the following conditions:  Circulatory failure (anemia requiring blood transfusion)   Critical care was time spent personally by me on the following activities:  Development of treatment plan with patient or surrogate, discussions with consultants, evaluation of patient's response to treatment, examination of patient, ordering and review of laboratory studies, ordering and review of radiographic studies, ordering and performing treatments and interventions, pulse oximetry, re-evaluation of patient's condition and review of old charts   I assumed direction of critical care for this patient from another provider in my  specialty: no     Care discussed with: admitting provider      MEDICATIONS ORDERED IN ED: Medications  tranexamic acid (CYKLOKAPRON) IVPB 1,000 mg (has no administration in time range)  0.9 %  sodium chloride  infusion (Manually program via Guardrails IV Fluids) (0 mLs Intravenous Stopped 09/27/23 1558)     IMPRESSION / MDM / ASSESSMENT AND PLAN / ED COURSE  I reviewed the triage vital signs and the nursing notes.                              DDX/MDM/AP: Differential diagnosis includes, but is not limited to, electrolyte abnormality, anemia, ACS, doubt acute intra-abdominal pathology, consider anxiety presentation.  Plan: - Labs - Cardiac monitor - EKG - Chest x-ray - Reassess  Patient's presentation is most consistent with acute presentation with potential threat to life or bodily function.  The patient is on the cardiac monitor to evaluate for evidence of arrhythmia and/or significant heart rate changes.  ED course below.  Workup notable for profound anemia to 5.8, normocytic, other cell lines low.  BUN normal.  Rectal exam subsequently negative with light brown stool.  Did have large amount of clotted red blood in her diaper however emanating from her vagina.  Subsequent chaperoned vaginal exam with further dark red clot.  Patient was not able to fall tolerate full pelvic exam but did have pooling of dark red blood in posterior vagina, unable to fully visualize source, no pulsatile bleeding, no bright red blood.  Exam aborted, patient would not have tolerated attempts at packing during this eval.  Gynecology consulted.  Had originally ordered 2 units of blood, third ordered in edition given active bleeding.  TXA ordered.  Vaginal ultrasound ordered.  Patient signed out to oncoming ED provider pending gynecology recommendations.  Clinical Course as of 09/27/23 1609  Sun Sep 27, 2023  1422 CBC with leukocytosis, new profound anemia [MM]  1441 CMP reviewed, unremarkable [MM]   1503 On-call obgyn cnm called x2, no answer, VM left [MM]  1519 D/w OBGYN They will staff with attending and eval patient shortly, currently finishing a delivery Will get back to us  with further recommendations [MM]    Clinical Course User Index [MM] Clarine Ozell LABOR, MD     FINAL CLINICAL IMPRESSION(S) / ED DIAGNOSES   Final diagnoses:  Anemia, unspecified type  Vaginal bleeding     Rx /  DC Orders   ED Discharge Orders     None        Note:  This document was prepared using Dragon voice recognition software and may include unintentional dictation errors.   Clarine Ozell LABOR, MD 09/27/23 [phone]

## 2023-09-27 NOTE — ED Provider Notes (Signed)
  Physical Exam  BP (!) 112/59   Pulse (!) 102   Temp 98.2 F (36.8 C) (Oral)   Resp 18   SpO2 100%   Physical Exam  Procedures  Procedures  ED Course / MDM   Clinical Course as of 09/27/23 1720  Sun Sep 27, 2023  1422 CBC with leukocytosis, new profound anemia [MM]  1441 CMP reviewed, unremarkable [MM]  1503 On-call obgyn cnm called x2, no answer, VM left [MM]  1519 D/w OBGYN They will staff with attending and eval patient shortly, currently finishing a delivery Will get back to us  with further recommendations [MM]  1705 Dr. Leonce - No biopsy in the hospital at this time. Recommends hospitalist admission for anemia, tachycardia, and hypotension. Will plan to start depo on her [DW]    Clinical Course User Index [DW] Malvina Alm DASEN, MD [MM] Clarine Ozell LABOR, MD   Medical Decision Making Amount and/or Complexity of Data Reviewed Labs: ordered. Radiology: ordered. ECG/medicine tests: ordered.  Risk Prescription drug management. Decision regarding hospitalization.   Received signout on patient.  68 year old female presenting today for chest pain and hypotension.  Seen with new onset anemia with hemoglobin of 5.8 by initial provider.  Otherwise troponin and EKG negative for cardiac concerns and patient not having any more chest pain since arrival here.  Ultimately was found to have vaginal bleeding as a source with clots seen by initial provider in the vaginal vault.  Patient started with blood transfusions and pelvic ultrasound ordered along with gynecology consultation.  Signed out pending completion of imaging and GYN recommendations.  Gynecology does not recommend immediate endometrial biopsy at this time.  Agrees with continued blood transfusion and monitoring her hemoglobin and making sure vital signs stabilize.  If she has any worsening bleeding in the next 24 hours, then may require endometrial biopsy.  Will place a note with his recommendations for medication  management.  Admitted to hospitalist for ongoing monitoring.     Malvina Alm DASEN, MD 09/27/23 [phone]

## 2023-09-27 NOTE — Consult Note (Signed)
 ER Consult Note  Patient ID: Valerie Leonard MRN: [id number] DOB/AGE: 1955-08-23 68 y.o.  Subjective  History of Present Illness: The patient is a 68 y.o. female who presents initially with headache, dizziness and chest pain to the ER. It was discovered that she had vaginal bleeding. Her hgb was 5.8 and likely the result of significant vaginal bleeding. ER physician requested gyn consult.  [name] is a poor historian. She initially stated she had some vaginal spotting yesterday. Then report a a few days ago that her diaper she wears for urine and mobility issues felt heavy with blood and had several golf ball sized blood clots in it. Her sister who is present with her believes the bleeding has likely been going on for a longer period of time. [name] reports menopause at age 66. States she has never had children. Her sister reports she was pregnant once when she was very young but had a miscarriage.  [name] is unable to elaborate on more details about her bleeding. Her sister feels she likely has dementia. Sole states she is healthy with no medical history or issues. Her sister also states she has no formal  medical diagnosis but she hasn't been to the doctor in years and has a mistrusts of healthcare primarily due to her husband passing about 13 years ago and feeling like the hospital was not able to save him. She asked me three separate times if she was dying and stated she had a premonition several days ago that she would die. She has limited mobility. Reports using a walking since damaging her knees with a falls years ago, her sister states its been about three years. She called an ambulance to bring her to the ER as she struggles significantly with mobility   History reviewed. No pertinent past medical history.  History reviewed. No pertinent surgical history.  No current facility-administered medications on file prior to encounter.   Current Outpatient Medications on File Prior to Encounter   Medication Sig Dispense Refill   citalopram  (CELEXA ) 20 MG tablet Take 1 tablet (20 mg total) by mouth daily. 60 tablet 1   folic acid  (FOLVITE ) 1 MG tablet Take 1 tablet (1 mg total) by mouth daily. 30 tablet 0   Multiple Vitamin (MULTIVITAMIN WITH MINERALS) TABS tablet Take 1 tablet by mouth daily. 30 tablet 1    No Known Allergies  Social History   Socioeconomic History   Marital status: Single    Spouse name: Not on file   Number of children: Not on file   Years of education: Not on file   Highest education level: Not on file  Occupational History   Not on file  Tobacco Use   Smoking status: Former    Types: Cigarettes, Cigars   Smokeless tobacco: Never  Vaping Use   Vaping status: Never Used  Substance and Sexual Activity   Alcohol use: Yes    Comment: More than 4 glasses of wine, mixed drinks, beer daily.    Drug use: Not Currently    Comment: former use of marijuana   Sexual activity: Not on file  Other Topics Concern   Not on file  Social History Narrative   Not on file   Social Drivers of Health   Financial Resource Strain: Not on file  Food Insecurity: Not on file  Transportation Needs: Not on file  Physical Activity: Not on file  Stress: Not on file  Social Connections: Not on file  Intimate Partner Violence: Not on  file    History reviewed. No pertinent family history.   ROS    Objective  Physical Exam: BP (!) 112/59   Pulse (!) 102   Temp 98.2 F (36.8 C) (Oral)   Resp 18   SpO2 100%   OBGyn Exam  Pelvic: blood present on external genitalia. Speculum exam shows several small clots within the vaginal vault, vaginal walls pink, cervix intact, no s/sx of trauma or active bleeding. Chux pad with minimum blood present and nurse states she has been on that pad since arrival to the ER.  Significant Findings/ Diagnostic Studies:  Pelvic u/s shows endometrial thickness of 3.20mm, no fibroids or masses, large amount of blood products suspected in  endometrial canal Hgb today is 5.8 with INR of 1.3 Patient currently receiving blood products, has remained somewhat hypotensive and tachycardic Not currently bleeding heavily    Assessment: 68 y.o. female with vaginal bleeding- not currently hemorrhaging Symptomatic anemia- receiving blood currently  Plan: Consulted with back up physician Dr. Garnette Mace of [facility]. Patient has numerous concerning issues that overall would be better managed with medical care by the hospitalist, especially considering ongoing hypotension and tachycardia Vaginal bleeding currently is stable and minimal. Can give provera 10mg  PO daily PRN for vaginal bleeding if needed Eventually patient should have endometrial biopsy completed but this can be arranged out patient once she is stabilized and medically cleared for discharge If bleeding becomes heavy consult with gyn team again and will likely plan on D&C Dr. Garnette Mace OBGYN spoke directly with Dr. Malvina who is currently managing [name]'s care in the ER and discussed these recommendations. Will reach back out to the gyn team as needed.    Lolita Loots CNM, FNP 09/27/2023, 5:05 PM

## 2023-09-27 NOTE — H&P (Signed)
 History and Physical    Patient: Valerie Leonard FMW:[id number] DOB: [birth year] DOA: 09/27/2023 DOS: the patient was seen and examined on 09/27/2023 PCP: Pcp, No  Patient coming from: Home  Chief Complaint: Vaginal bleeding Chief Complaint  Patient presents with   Chest Pain   HPI: Kurt Azimi is a 68 y.o. female with medical history significant of depression who is not on any home medication presents with 1 to 2 weeks history of vaginal bleeding according to patient she started feeling generalized weakness and therefore came to the emergency room for further management.  Denies nausea vomiting abdominal pain chest pain or cough.  ED course: Patient was mildly tachycardic to high 90s to low 100s systolic blood pressure of 107-110.  Saturating 100% on room air: Hemoglobin of 5.8 ED physician discussed with Dr. Leonce gynecologist who recommended patient undergo blood transfusion and monitoring overnight for possible endometrial biopsy pending clinical course.  Hospitalist service was therefore contacted to admit patient for further management. Review of Systems: As mentioned in the history of present illness. All other systems reviewed and are negative. History reviewed. No pertinent past medical history. History reviewed. No pertinent surgical history. Social History:  reports that she has quit smoking. Her smoking use included cigarettes and cigars. She has never used smokeless tobacco. She reports current alcohol use. She reports that she does not currently use drugs.  No Known Allergies  History reviewed. No pertinent family history.  Prior to Admission medications   Medication Sig Start Date End Date Taking? Authorizing Provider  citalopram  (CELEXA ) 20 MG tablet Take 1 tablet (20 mg total) by mouth daily. 07/28/20   Patel, Sona, MD  folic acid  (FOLVITE ) 1 MG tablet Take 1 tablet (1 mg total) by mouth daily. 07/28/20   Patel, Sona, MD  Multiple Vitamin (MULTIVITAMIN WITH MINERALS) TABS  tablet Take 1 tablet by mouth daily. 07/28/20   Tobie Calix, MD    Physical Exam: Vitals:   09/27/23 1535 09/27/23 1618 09/27/23 1620 09/27/23 1635  BP: (!) 104/57 107/79 107/79 (!) 112/59  Pulse: (!) 110 (!) 112 (!) 112 (!) 102  Resp: 13 18 18 18   Temp:  98.3 F (36.8 C) 98.3 F (36.8 C) 98.2 F (36.8 C)  TempSrc:  Oral  Oral  SpO2: 100% 100%  100%   General: Not in acute distress Abdomen: Flat and nontender CNS: Alert and oriented x 3 moving all extremities CVS: S1 and S2 present no Mamma Respiratory: Clear to auscultation bilaterally Musculoskeletal: No abnormality detected  Data Reviewed: Pelvic ultrasound showing large amount of blood products throughout the endometrial canal    Latest Ref Rng & Units 09/27/2023    2:06 PM 01/18/2021    7:40 PM 07/25/2020    5:56 AM  CBC  WBC 4.0 - 10.5 K/uL 13.3  11.1  9.7   Hemoglobin 12.0 - 15.0 g/dL 5.8  86.5  88.7   Hematocrit 36.0 - 46.0 % 18.2  40.1  32.6   Platelets 150 - 400 K/uL 489  277  295        Latest Ref Rng & Units 09/27/2023    2:06 PM 01/18/2021    7:40 PM 07/26/2020    5:57 AM  CMP  Glucose 70 - 99 mg/dL 898  92  74   BUN 8 - 23 mg/dL 11  8  <5   Creatinine 0.44 - 1.00 mg/dL 9.34  9.38  9.59   Sodium 135 - 145 mmol/L 133  138  134  Potassium 3.5 - 5.1 mmol/L 3.7  4.3  3.9   Chloride 98 - 111 mmol/L 100  105  100   CO2 22 - 32 mmol/L 23  25  25    Calcium  8.9 - 10.3 mg/dL 8.1  9.2  7.6   Total Protein 6.5 - 8.1 g/dL 6.5  7.6    Total Bilirubin 0.0 - 1.2 mg/dL 0.6  0.6    Alkaline Phos 38 - 126 U/L 52  77    AST 15 - 41 U/L 25  27    ALT 0 - 44 U/L 9  12       Assessment and Plan:  Dysfunctional uterine bleeding in a postmenopausal woman Acute blood loss anemia Will complete 2 units of blood transfusion We will obtain posttransfusion CBC Plan of care discussed with Dr. Leonce gynecologist  Hyponatremia likely secondary to hypovolemia Continue IV fluid with Monitor electrolytes closely  Mild  leukocytosis likely secondary to dehydration and hemoconcentration Antibiotics not indicated at this time We will continue to monitor closely  Major depressive disorder Patient not on any home medications    Advance Care Planning:   Code Status: Prior full code  Consults: Gynecology  Family Communication: Discussed with sister at bedside  Severity of Illness: The appropriate patient status for this patient is OBSERVATION. Observation status is judged to be reasonable and necessary in order to provide the required intensity of service to ensure the patient's safety. The patient's presenting symptoms, physical exam findings, and initial radiographic and laboratory data in the context of their medical condition is felt to place them at decreased risk for further clinical deterioration. Furthermore, it is anticipated that the patient will be medically stable for discharge from the hospital within 2 midnights of admission.   Author: Drue ONEIDA Potter, MD 09/27/2023 5:25 PM  For on call review [email or web address].

## 2023-09-28 ENCOUNTER — Inpatient Hospital Stay

## 2023-09-28 DIAGNOSIS — D72829 Elevated white blood cell count, unspecified: Secondary | ICD-10-CM | POA: Diagnosis present

## 2023-09-28 DIAGNOSIS — F329 Major depressive disorder, single episode, unspecified: Secondary | ICD-10-CM | POA: Diagnosis present

## 2023-09-28 DIAGNOSIS — Z79899 Other long term (current) drug therapy: Secondary | ICD-10-CM | POA: Diagnosis not present

## 2023-09-28 DIAGNOSIS — N938 Other specified abnormal uterine and vaginal bleeding: Secondary | ICD-10-CM | POA: Diagnosis present

## 2023-09-28 DIAGNOSIS — E871 Hypo-osmolality and hyponatremia: Secondary | ICD-10-CM | POA: Diagnosis present

## 2023-09-28 DIAGNOSIS — D62 Acute posthemorrhagic anemia: Secondary | ICD-10-CM | POA: Diagnosis present

## 2023-09-28 DIAGNOSIS — L8990 Pressure ulcer of unspecified site, unspecified stage: Secondary | ICD-10-CM | POA: Insufficient documentation

## 2023-09-28 DIAGNOSIS — Z87891 Personal history of nicotine dependence: Secondary | ICD-10-CM | POA: Diagnosis not present

## 2023-09-28 DIAGNOSIS — N939 Abnormal uterine and vaginal bleeding, unspecified: Secondary | ICD-10-CM | POA: Diagnosis present

## 2023-09-28 LAB — CBC WITH DIFFERENTIAL/PLATELET
Abs Immature Granulocytes: 0.1 10*3/uL — ABNORMAL HIGH (ref 0.00–0.07)
Basophils Absolute: 0.1 10*3/uL (ref 0.0–0.1)
Basophils Relative: 1 %
Eosinophils Absolute: 0.1 10*3/uL (ref 0.0–0.5)
Eosinophils Relative: 1 %
HCT: 27.4 % — ABNORMAL LOW (ref 36.0–46.0)
Hemoglobin: 8.9 g/dL — ABNORMAL LOW (ref 12.0–15.0)
Immature Granulocytes: 1 %
Lymphocytes Relative: 25 %
Lymphs Abs: 3.7 10*3/uL (ref 0.7–4.0)
MCH: 28.2 pg (ref 26.0–34.0)
MCHC: 32.5 g/dL (ref 30.0–36.0)
MCV: 86.7 fL (ref 80.0–100.0)
Monocytes Absolute: 1 10*3/uL (ref 0.1–1.0)
Monocytes Relative: 7 %
Neutro Abs: 9.9 10*3/uL — ABNORMAL HIGH (ref 1.7–7.7)
Neutrophils Relative %: 65 %
Platelets: 459 10*3/uL — ABNORMAL HIGH (ref 150–400)
RBC: 3.16 MIL/uL — ABNORMAL LOW (ref 3.87–5.11)
RDW: 15.2 % (ref 11.5–15.5)
WBC: 14.9 10*3/uL — ABNORMAL HIGH (ref 4.0–10.5)
nRBC: 0 % (ref 0.0–0.2)

## 2023-09-28 LAB — CBC
HCT: 25.4 % — ABNORMAL LOW (ref 36.0–46.0)
Hemoglobin: 8.1 g/dL — ABNORMAL LOW (ref 12.0–15.0)
MCH: 27.6 pg (ref 26.0–34.0)
MCHC: 31.9 g/dL (ref 30.0–36.0)
MCV: 86.4 fL (ref 80.0–100.0)
Platelets: 439 10*3/uL — ABNORMAL HIGH (ref 150–400)
RBC: 2.94 MIL/uL — ABNORMAL LOW (ref 3.87–5.11)
RDW: 14.3 % (ref 11.5–15.5)
WBC: 13.3 10*3/uL — ABNORMAL HIGH (ref 4.0–10.5)
nRBC: 0 % (ref 0.0–0.2)

## 2023-09-28 LAB — URINALYSIS, COMPLETE (UACMP) WITH MICROSCOPIC
Bilirubin Urine: NEGATIVE
Glucose, UA: NEGATIVE mg/dL
Ketones, ur: NEGATIVE mg/dL
Nitrite: POSITIVE — AB
Protein, ur: NEGATIVE mg/dL
Specific Gravity, Urine: 1.018 (ref 1.005–1.030)
WBC, UA: >50 WBC/hpf (ref 0–5)
pH: 6 (ref 5.0–8.0)

## 2023-09-28 LAB — TYPE AND SCREEN
ABO/RH(D): O POS
Antibody Screen: NEGATIVE
Unit division: 0
Unit division: 0

## 2023-09-28 LAB — BPAM RBC
Blood Product Expiration Date: 202507222359
Blood Product Expiration Date: 202507232359
ISSUE DATE / TIME: 202506221614
ISSUE DATE / TIME: 202506222118
Unit Type and Rh: 5100
Unit Type and Rh: 5100

## 2023-09-28 LAB — BASIC METABOLIC PANEL WITH GFR
Anion gap: 7 (ref 5–15)
BUN: 9 mg/dL (ref 8–23)
CO2: 25 mmol/L (ref 22–32)
Calcium: 8.3 mg/dL — ABNORMAL LOW (ref 8.9–10.3)
Chloride: 103 mmol/L (ref 98–111)
Creatinine, Ser: 0.46 mg/dL (ref 0.44–1.00)
GFR, Estimated: >60 mL/min (ref >60)
Glucose, Bld: 104 mg/dL — ABNORMAL HIGH (ref 70–99)
Potassium: 3.4 mmol/L — ABNORMAL LOW (ref 3.5–5.1)
Sodium: 135 mmol/L (ref 135–145)

## 2023-09-28 MED ORDER — POTASSIUM CHLORIDE CRYS ER 20 MEQ PO TBCR
40.0000 meq | EXTENDED_RELEASE_TABLET | ORAL | Status: AC
  Administered 2023-09-28 (×2): 40 meq via ORAL
  Filled 2023-09-28 (×2): qty 2

## 2023-09-28 NOTE — Discharge Instructions (Signed)
 Some PCP options in [place] area- not a comprehensive list  [facility]- [phone] [facility]- [phone] [facility]- [phone] [facility]- [phone] [facility]- [phone] [facility]- [phone]  or [facility] Physician Referral Line [phone]

## 2023-09-28 NOTE — Progress Notes (Signed)
 Progress Note   Patient: Valerie Leonard FMW:[id number] DOB: May 07, 1955 DOA: 09/27/2023     0 DOS: the patient was seen and examined on 09/28/2023   Brief hospital course: From HPI Valerie Leonard is a 68 y.o. female with medical history significant of depression who is not on any home medication presents with 1 to 2 weeks history of vaginal bleeding according to patient she started feeling generalized weakness and therefore came to the emergency room for further management.  Denies nausea vomiting abdominal pain chest pain or cough.   ED course: Patient was mildly tachycardic to high 90s to low 100s systolic blood pressure of 107-110.  Saturating 100% on room air: Hemoglobin of 5.8 ED physician discussed with Dr. Leonce gynecologist who recommended patient undergo blood transfusion and monitoring overnight for possible endometrial biopsy pending clinical course.  Hospitalist service was therefore contacted to admit patient for further management.    Assessment and Plan:  Dysfunctional uterine bleeding in a postmenopausal woman Acute blood loss anemia Has completed 2 units of blood transfusion Hemoglobin have improved posttransfusion We will monitor hemoglobin closely and transfuse for Hb greater than 7 Plan of care discussed with Dr. Leonce gynecologist   Hyponatremia likely secondary to hypovolemia Continue IV fluid with Monitor electrolytes closely   Mild leukocytosis likely secondary to dehydration and hemoconcentration Antibiotics not indicated at this time We will continue to monitor closely   Major depressive disorder Patient not on any home medications     Advance Care Planning:   Code Status: Prior full code   Consults: Gynecology   Family Communication: Discussed with sister at bedside  Subjective:  Still having vaginal bleeding Denies dizziness headache Admits to improvement in abdominal pain  Physical Exam:  General: Not in acute distress Abdomen: Flat and  nontender CNS: Alert and oriented x 3 moving all extremities CVS: S1 and S2 present no Mamma Respiratory: Clear to auscultation bilaterally Musculoskeletal: No abnormality detected   Vitals:   09/27/23 2144 09/28/23 0032 09/28/23 0722 09/28/23 1512  BP: (!) 90/54 102/61 90/76 (!) 92/54  Pulse: 92 78 80 95  Resp: 20 20 18 19   Temp: 98.5 F (36.9 C) 98 F (36.7 C) 98.2 F (36.8 C) 98.4 F (36.9 C)  TempSrc: Oral Oral Oral Oral  SpO2: 100% 100% 100% 100%    Data Reviewed:    Latest Ref Rng & Units 09/28/2023    3:37 PM 09/28/2023    1:11 AM 09/27/2023    2:06 PM  CBC  WBC 4.0 - 10.5 K/uL 14.9  13.3  13.3   Hemoglobin 12.0 - 15.0 g/dL 8.9  8.1  5.8   Hematocrit 36.0 - 46.0 % 27.4  25.4  18.2   Platelets 150 - 400 K/uL 459  439  489        Latest Ref Rng & Units 09/28/2023    1:11 AM 09/27/2023    2:06 PM 01/18/2021    7:40 PM  BMP  Glucose 70 - 99 mg/dL 895  898  92   BUN 8 - 23 mg/dL 9  11  8    Creatinine 0.44 - 1.00 mg/dL 9.53  9.34  9.38   Sodium 135 - 145 mmol/L 135  133  138   Potassium 3.5 - 5.1 mmol/L 3.4  3.7  4.3   Chloride 98 - 111 mmol/L 103  100  105   CO2 22 - 32 mmol/L 25  23  25    Calcium  8.9 - 10.3 mg/dL 8.3  8.1  9.2       Disposition: Status is: Inpatient   Time spent: 56 minutes  Author: Drue ONEIDA Potter, MD 09/28/2023 4:53 PM  For on call review [email or web address].

## 2023-09-28 NOTE — Progress Notes (Signed)
 Discussed with Dr. Cleatus, will hold off on 3rd unit of PRBC since hgb up to 8.1 after 2 units. Glade Lee BSN RN CMSRN 09/28/2023, 4:19 AM

## 2023-09-28 NOTE — Care Management Obs Status (Signed)
 MEDICARE OBSERVATION STATUS NOTIFICATION   Patient Details  Name: Valerie Leonard MRN: [id number] Date of Birth: 1956/03/25   Medicare Observation Status Notification Given:  Yes    Dezirae Service W, CMA 09/28/2023, 1:56 PM

## 2023-09-28 NOTE — TOC Initial Note (Signed)
 Transition of Care [facility]) - Initial/Assessment Note    Patient Details  Name: Valerie Leonard MRN: [id number] Date of Birth: 1955/11/23  Transition [facility] CM/SW Contact:    Quintella Suzen Jansky, RN Phone Number: 09/28/2023, 12:56 PM  Clinical Narrative:                    Patient is independent with ADLs. No PCP listed, added PCP list to AVS. No other discharge needs identified at this time by TOC.      Patient Goals and CMS Choice            Expected Discharge Plan and Services                                              Prior Living Arrangements/Services                       Activities of Daily Living   ADL Screening (condition at time of admission) Independently performs ADLs?: No Does the patient have a NEW difficulty with bathing/dressing/toileting/self-feeding that is expected to last >3 days?: No Does the patient have a NEW difficulty with getting in/out of bed, walking, or climbing stairs that is expected to last >3 days?: No Does the patient have a NEW difficulty with communication that is expected to last >3 days?: No Is the patient deaf or have difficulty hearing?: No Does the patient have difficulty seeing, even when wearing glasses/contacts?: No Does the patient have difficulty concentrating, remembering, or making decisions?: Yes  Permission Sought/Granted                  Emotional Assessment              Admission diagnosis:  Vaginal bleeding [N93.9] Anemia, unspecified type [D64.9] Patient Active Problem List   Diagnosis Date Noted   Vaginal bleeding 09/27/2023   Severe major depression, single episode, without psychotic features (HCC) 07/26/2020   Depression    Acute delirium 07/25/2020   Acute metabolic encephalopathy    Hypomagnesemia    Chronic alcohol abuse    Hypokalemia 07/24/2020   PCP:  Pcp, No Pharmacy:   [facility] [address] [place]  [place] [address] Phone: [phone] Fax: [phone]  [facility] [place], [place] - [address] [place] [address] Phone: [phone] Fax: [phone]     Social Drivers of Health (SDOH) Social History: SDOH Screenings   Food Insecurity: Food Insecurity Present (09/27/2023)  Housing: Low Risk  (09/27/2023)  Transportation Needs: Unmet Transportation Needs (09/27/2023)  Utilities: Not At Risk (09/27/2023)  Social Connections: Socially Isolated (09/28/2023)  Tobacco Use: Medium Risk (09/27/2023)   SDOH Interventions:     Readmission Risk Interventions     No data to display

## 2023-09-29 ENCOUNTER — Other Ambulatory Visit: Payer: Self-pay

## 2023-09-29 DIAGNOSIS — N939 Abnormal uterine and vaginal bleeding, unspecified: Secondary | ICD-10-CM | POA: Diagnosis not present

## 2023-09-29 LAB — CBC WITH DIFFERENTIAL/PLATELET
Abs Immature Granulocytes: 0.09 10*3/uL — ABNORMAL HIGH (ref 0.00–0.07)
Basophils Absolute: 0.1 10*3/uL (ref 0.0–0.1)
Basophils Relative: 1 %
Eosinophils Absolute: 0.2 10*3/uL (ref 0.0–0.5)
Eosinophils Relative: 1 %
HCT: 26.7 % — ABNORMAL LOW (ref 36.0–46.0)
Hemoglobin: 8.4 g/dL — ABNORMAL LOW (ref 12.0–15.0)
Immature Granulocytes: 1 %
Lymphocytes Relative: 24 %
Lymphs Abs: 2.9 10*3/uL (ref 0.7–4.0)
MCH: 27.6 pg (ref 26.0–34.0)
MCHC: 31.5 g/dL (ref 30.0–36.0)
MCV: 87.8 fL (ref 80.0–100.0)
Monocytes Absolute: 0.8 10*3/uL (ref 0.1–1.0)
Monocytes Relative: 7 %
Neutro Abs: 8.1 10*3/uL — ABNORMAL HIGH (ref 1.7–7.7)
Neutrophils Relative %: 66 %
Platelets: 445 10*3/uL — ABNORMAL HIGH (ref 150–400)
RBC: 3.04 MIL/uL — ABNORMAL LOW (ref 3.87–5.11)
RDW: 15.2 % (ref 11.5–15.5)
WBC: 12.1 10*3/uL — ABNORMAL HIGH (ref 4.0–10.5)
nRBC: 0 % (ref 0.0–0.2)

## 2023-09-29 LAB — BASIC METABOLIC PANEL WITH GFR
Anion gap: 8 (ref 5–15)
BUN: 8 mg/dL (ref 8–23)
CO2: 24 mmol/L (ref 22–32)
Calcium: 8.4 mg/dL — ABNORMAL LOW (ref 8.9–10.3)
Chloride: 108 mmol/L (ref 98–111)
Creatinine, Ser: 0.39 mg/dL — ABNORMAL LOW (ref 0.44–1.00)
GFR, Estimated: >60 mL/min (ref >60)
Glucose, Bld: 102 mg/dL — ABNORMAL HIGH (ref 70–99)
Potassium: 4.2 mmol/L (ref 3.5–5.1)
Sodium: 140 mmol/L (ref 135–145)

## 2023-09-29 MED ORDER — SODIUM CHLORIDE 0.9 % IV SOLN
1.0000 g | INTRAVENOUS | Status: DC
  Administered 2023-09-29: 1 g via INTRAVENOUS
  Filled 2023-09-29: qty 10

## 2023-09-29 MED ORDER — CEPHALEXIN 500 MG PO CAPS: 500.0000 mg | ORAL_CAPSULE | Freq: Two times a day (BID) | ORAL | Status: DC

## 2023-09-29 MED ORDER — MEDROXYPROGESTERONE ACETATE 10 MG PO TABS
10.0000 mg | ORAL_TABLET | Freq: Two times a day (BID) | ORAL | 0 refills | Status: DC
  Filled 2023-09-29: qty 14, 7d supply, fill #0

## 2023-09-29 MED ORDER — MEDROXYPROGESTERONE ACETATE 10 MG PO TABS
10.0000 mg | ORAL_TABLET | Freq: Every day | ORAL | Status: DC
  Administered 2023-09-29: 10 mg via ORAL
  Filled 2023-09-29: qty 1

## 2023-09-29 MED ORDER — CEPHALEXIN 500 MG PO CAPS
500.0000 mg | ORAL_CAPSULE | Freq: Two times a day (BID) | ORAL | 0 refills | Status: AC
  Filled 2023-09-29: qty 8, 4d supply, fill #0

## 2023-09-29 NOTE — TOC Transition Note (Signed)
 Transition of Care [facility]) - Discharge Note   Patient Details  Name: Barry Culverhouse MRN: [id number] Date of Birth: 15-Feb-1956  Transition of Care [facility]) CM/SW Contact:  Quintella Suzen Jansky, RN Phone Number: 09/29/2023, 2:39 PM   Clinical Narrative:     Patient to discharge today, home.  Final next level of care: Home/Self Care Barriers to Discharge: Barriers Resolved   Patient Goals and CMS Choice            Discharge Placement                    Patient and family notified of of transfer: 09/29/23  Discharge Plan and Services Additional resources added to the After Visit Summary for                    DME Agency: NA       HH Arranged: NA          Social Drivers of Health (SDOH) Interventions SDOH Screenings   Food Insecurity: Food Insecurity Present (09/27/2023)  Housing: Low Risk  (09/27/2023)  Transportation Needs: Unmet Transportation Needs (09/27/2023)  Utilities: Not At Risk (09/27/2023)  Social Connections: Socially Isolated (09/28/2023)  Tobacco Use: Medium Risk (09/27/2023)     Readmission Risk Interventions     No data to display

## 2023-09-29 NOTE — Discharge Summary (Signed)
 Physician Discharge Summary   Patient: Valerie Leonard MRN: [id number] DOB: [birth year]  Admit date:     09/27/2023  Discharge date: 09/29/23  Discharge Physician: Drue ONEIDA Potter   PCP: Pcp, No   Recommendations at discharge:  Follow-up with gynecologist  Discharge Diagnoses:  Dysfunctional uterine bleeding in a postmenopausal woman Acute blood loss anemia UTI Major depressive disorder   Hospital Course:  Valerie Leonard is a 68 y.o. female with medical history significant of depression who is not on any home medication presents with 1 to 2 weeks history of vaginal bleeding according to patient she started feeling generalized weakness and therefore came to the emergency room for further management.  ED physician discussed with Dr. Leonce gynecologist who recommended patient undergo blood transfusion and monitoring overnight for possible endometrial biopsy pending clinical course.  Bleeding per vagina improved and hemoglobin stabilized and therefore patient being discharged to follow-up with gynecologist   Consultants: OB/GYN Procedures performed: None Disposition: Home Diet recommendation:  Cardiac diet DISCHARGE MEDICATION: Allergies as of 09/29/2023   No Known Allergies      Medication List     TAKE these medications    cephALEXin 500 MG capsule Commonly known as: KEFLEX Take 1 capsule (500 mg total) by mouth 2 (two) times daily for 4 days.   citalopram  20 MG tablet Commonly known as: CELEXA  Take 1 tablet (20 mg total) by mouth daily.   folic acid  1 MG tablet Commonly known as: FOLVITE  Take 1 tablet (1 mg total) by mouth daily.   medroxyPROGESTERone 10 MG tablet Commonly known as: PROVERA Take 1 tablet (10 mg total) by mouth 2 (two) times daily for 7 days.   multivitamin with minerals Tabs tablet Take 1 tablet by mouth daily.        Follow-up Information     Leonce Garnette BIRCH, MD. Call.   Specialty: Obstetrics and Gynecology Contact information: [address] [place] [address] [phone]                Discharge Exam: There were no vitals filed for this visit. General: Not in acute distress Abdomen: Flat and nontender CNS: Alert and oriented x 3 moving all extremities CVS: S1 and S2 present no Mamma Respiratory: Clear to auscultation bilaterally Musculoskeletal: No abnormality detected  Condition at discharge: good  The results of significant diagnostics from this hospitalization (including imaging, microbiology, ancillary and laboratory) are listed below for reference.   Imaging Studies: DG Chest Port 1 View Result Date: 09/28/2023 CLINICAL DATA:  Leukocytosis EXAM: PORTABLE CHEST 1 VIEW COMPARISON:  09/27/2023 FINDINGS: Stable cardiomediastinal silhouette. No focal consolidation, pleural effusion, or pneumothorax. No displaced rib fractures. IMPRESSION: No active disease. Electronically Signed   By: Norman Gatlin M.D.   On: 09/28/2023 17:33   US  PELVIC COMPLETE WITH TRANSVAGINAL Result Date: 09/27/2023 CLINICAL DATA:  Vaginal bleeding. EXAM: TRANSABDOMINAL AND TRANSVAGINAL ULTRASOUND OF PELVIS TECHNIQUE: Both transabdominal and transvaginal ultrasound examinations of the pelvis were performed. Transabdominal technique was performed for global imaging of the pelvis including uterus, ovaries, adnexal regions, and pelvic cul-de-sac. It was necessary to proceed with endovaginal exam following the transabdominal exam to visualize the uterus, endometrium, bilateral ovaries and bilateral adnexa. COMPARISON:  None Available. FINDINGS: Uterus Measurements: 8.7 cm x 3.4 cm x 4.4 cm = volume: 68 mL. No fibroids or other mass visualized. Endometrium Thickness: 3.9 mm. A large amount of blood products are suspected throughout the endometrial canal. Right ovary Measurements: 2.1 cm x 1.2 cm x 1.4  cm = volume: 1.8 mL. Normal appearance/no adnexal mass. Left ovary Measurements: 2.1 cm x 1.4 cm x 1.3 cm = volume: 2.0 mL. Normal  appearance/no adnexal mass. Other findings No abnormal free fluid. IMPRESSION: Large amount of blood products blood products suspected throughout the endometrial canal. Correlation with 6-12 week follow-up pelvic ultrasound is recommended to further exclude the presence of an underlying endometrial lesion. Electronically Signed   By: Suzen Dials M.D.   On: 09/27/2023 16:40   DG Chest Portable 1 View Result Date: 09/27/2023 CLINICAL DATA:  Chest pain. EXAM: PORTABLE CHEST 1 VIEW COMPARISON:  July 24, 2020 FINDINGS: The heart size and mediastinal contours are within normal limits. Both lungs are clear. The visualized skeletal structures are unremarkable. IMPRESSION: No active disease. Electronically Signed   By: Suzen Dials M.D.   On: 09/27/2023 14:28    Microbiology: Results for orders placed or performed during the hospital encounter of 07/24/20  SARS CORONAVIRUS 2 (TAT 6-24 HRS) Nasopharyngeal Nasopharyngeal Swab     Status: None   Collection Time: 07/24/20  4:47 PM   Specimen: Nasopharyngeal Swab  Result Value Ref Range Status   SARS Coronavirus 2 NEGATIVE NEGATIVE Final    Comment: (NOTE) SARS-CoV-2 target nucleic acids are NOT DETECTED.  The SARS-CoV-2 RNA is generally detectable in upper and lower respiratory specimens during the acute phase of infection. Negative results do not preclude SARS-CoV-2 infection, do not rule out co-infections with other pathogens, and should not be used as the sole basis for treatment or other patient management decisions. Negative results must be combined with clinical observations, patient history, and epidemiological information. The expected result is Negative.  Fact Sheet for Patients: HairSlick.no  Fact Sheet for Healthcare Providers: quierodirigir.com  This test is not yet approved or cleared by the [place]  FDA and  has been authorized for detection and/or diagnosis of  SARS-CoV-2 by FDA under an Emergency Use Authorization (EUA). This EUA will remain  in effect (meaning this test can be used) for the duration of the COVID-19 declaration under Se ction 564(b)(1) of the Act, 21 U.S.C. section 360bbb-3(b)(1), unless the authorization is terminated or revoked sooner.  Performed at [facility], [address] [place], [place] [address]   Blood culture (routine x 2)     Status: None   Collection Time: 07/24/20  7:12 PM   Specimen: BLOOD  Result Value Ref Range Status   Specimen Description   Final    BLOOD RIGHT ANTECUBITAL Performed at [facility], [address] [place], [place] [address]    Special Requests   Final    BOTTLES DRAWN AEROBIC AND ANAEROBIC Blood Culture adequate volume Performed at [facility] [address] [place], [place] [address]    Culture  Setup Time   Final    GRAM POSITIVE RODS AEROBIC BOTTLE ONLY CRITICAL RESULT CALLED TO, READ BACK BY AND VERIFIED WITH: LISA KLUTTZ AT 1254 ON 07/26/20 [facility]. Performed at [facility], [address] [place], [place] [address]    Culture   Final    Corynebacterium otitidis Standardized susceptibility testing for this organism is not available. Performed at [facility], [address] [place], [place] [address]    Report Status 07/28/2020 FINAL  Final  Blood culture (routine x 2)     Status: Abnormal   Collection Time: 07/24/20  7:12 PM   Specimen: BLOOD  Result Value Ref Range Status   Specimen Description   Final  BLOOD LEFT ANTECUBITAL Performed at [facility], [address] [place], [place] [address]    Special Requests   Final    BOTTLES DRAWN AEROBIC AND ANAEROBIC Blood Culture adequate volume Performed at [facility], [address] [place], [place] [address]    Culture  Setup Time   Final    Organism ID to follow GRAM POSITIVE COCCI AEROBIC BOTTLE ONLY CRITICAL RESULT CALLED TO, READ BACK BY AND  VERIFIED WITH: LISA KLUTTZ @1720  ON 07/27/20 [facility] Performed at [facility], [address] [place], [place] [address]    Culture (A)  Final    ALLOIOCOCCUS OTITIS Standardized susceptibility testing for this organism is not available. Performed at [facility], [address], [place], [place] [address]    Report Status 07/30/2020 FINAL  Final  Blood Culture ID Panel (Reflexed)     Status: None   Collection Time: 07/24/20  7:12 PM  Result Value Ref Range Status   Enterococcus faecalis NOT DETECTED NOT DETECTED Final   Enterococcus Faecium NOT DETECTED NOT DETECTED Final   Listeria monocytogenes NOT DETECTED NOT DETECTED Final   Staphylococcus species NOT DETECTED NOT DETECTED Final   Staphylococcus aureus (BCID) NOT DETECTED NOT DETECTED Final   Staphylococcus epidermidis NOT DETECTED NOT DETECTED Final   Staphylococcus lugdunensis NOT DETECTED NOT DETECTED Final   Streptococcus species NOT DETECTED NOT DETECTED Final   Streptococcus agalactiae NOT DETECTED NOT DETECTED Final   Streptococcus pneumoniae NOT DETECTED NOT DETECTED Final   Streptococcus pyogenes NOT DETECTED NOT DETECTED Final   A.calcoaceticus-baumannii NOT DETECTED NOT DETECTED Final   Bacteroides fragilis NOT DETECTED NOT DETECTED Final   Enterobacterales NOT DETECTED NOT DETECTED Final   Enterobacter cloacae complex NOT DETECTED NOT DETECTED Final   Escherichia coli NOT DETECTED NOT DETECTED Final   Klebsiella aerogenes NOT DETECTED NOT DETECTED Final   Klebsiella oxytoca NOT DETECTED NOT DETECTED Final   Klebsiella pneumoniae NOT DETECTED NOT DETECTED Final   Proteus species NOT DETECTED NOT DETECTED Final   Salmonella species NOT DETECTED NOT DETECTED Final   Serratia marcescens NOT DETECTED NOT DETECTED Final   Haemophilus influenzae NOT DETECTED NOT DETECTED Final   Neisseria meningitidis NOT DETECTED NOT DETECTED Final   Pseudomonas aeruginosa NOT DETECTED NOT DETECTED Final    Stenotrophomonas maltophilia NOT DETECTED NOT DETECTED Final   Candida albicans NOT DETECTED NOT DETECTED Final   Candida auris NOT DETECTED NOT DETECTED Final   Candida glabrata NOT DETECTED NOT DETECTED Final   Candida krusei NOT DETECTED NOT DETECTED Final   Candida parapsilosis NOT DETECTED NOT DETECTED Final   Candida tropicalis NOT DETECTED NOT DETECTED Final   Cryptococcus neoformans/gattii NOT DETECTED NOT DETECTED Final    Comment: Performed at [facility], [address] [place], [place] [address]    Labs: CBC: Recent Labs  Lab 09/27/23 1406 09/28/23 0111 09/28/23 1537 09/29/23 0411  WBC 13.3* 13.3* 14.9* 12.1*  NEUTROABS 9.6*  --  9.9* 8.1*  HGB 5.8* 8.1* 8.9* 8.4*  HCT 18.2* 25.4* 27.4* 26.7*  MCV 87.1 86.4 86.7 87.8  PLT 489* 439* 459* 445*   Basic Metabolic Panel: Recent Labs  Lab 09/27/23 1406 09/28/23 0111 09/29/23 0411  NA 133* 135 140  K 3.7 3.4* 4.2  CL 100 103 108  CO2 23 25 24   GLUCOSE 101* 104* 102*  BUN 11 9 8   CREATININE 0.65 0.46 0.39*  CALCIUM  8.1* 8.3* 8.4*   Liver Function Tests: Recent Labs  Lab 09/27/23 1406  AST 25  ALT 9  ALKPHOS 52  BILITOT 0.6  PROT 6.5  ALBUMIN 2.6*   CBG: No results for input(s): GLUCAP in the last 168 hours.  Discharge time spent:  38 minutes.  Signed: Drue ONEIDA Potter, MD [facility] 09/29/2023

## 2023-09-29 NOTE — Plan of Care (Signed)
   Problem: Clinical Measurements: Goal: Will remain free from infection Outcome: Progressing Goal: Diagnostic test results will improve Outcome: Progressing   Problem: Activity: Goal: Risk for activity intolerance will decrease Outcome: Progressing

## 2023-09-30 LAB — HEMOGLOBIN FREE, PLASMA: Hgb, Plasma: 3.1 mg/dL (ref 0.0–4.9)

## 2023-10-13 NOTE — Progress Notes (Signed)
 Obstetrics & Gynecology Office Visit   Chief Complaint:  Chief Complaint  Patient presents with  . Ed Follow-up    09/27/23 Vaginal Bleeding     History of Present Illness Valerie Leonard is a 68 year old G0 female who presents with postmenopausal vaginal bleeding.  Two weeks ago, she experienced significant vaginal bleeding, severe enough to require emergency medical attention. The bleeding was so heavy that it saturated her diaper. Prior to this, she had minor spotting for a couple of days.  She recalls using a dildo a day or two before the significant bleeding episode, which resulted in minor spotting and a small amount of blood on the dildo. She has not been sexually active with a partner since her husband's passing 15 years ago.  During her hospital stay, she underwent an ultrasound and received medication that successfully stopped the bleeding but caused xerostomia. She also required a blood transfusion due to a hemoglobin level of 5.8 g/dL, attributed to the significant blood loss.  Her past medical history includes menopause at age 39. She manages knee pain with extra strength Tylenol . She has no children, has not had any surgeries, and uses a walker for mobility, keeping a phone on it for emergencies.  No current vaginal bleeding. She has not had a Pap smear since her husband's passing.   She presented to the ED with mild tachycardia (90s-100s), blood pressures were initially a little lower. So, admitted to hospitalist service.   Hemoglobin on admission: 5.8 Hemoglobin on discharge: 8.4 (s/p 2 units pRBCs) She also had hyponatremia (132) and hypokalemia (3.4).  Pelvic ultrasound 09/27/2023 (report):  FINDINGS:  Uterus  Measurements: 8.7 cm x 3.4 cm x 4.4 cm = volume: 68 mL. No fibroids or other mass visualized.   Endometrium  Thickness: 3.9 mm. A large amount of blood products are suspected throughout the endometrial canal.   Right ovary  Measurements: 2.1 cm x 1.2 cm x  1.4 cm = volume: 1.8 mL. Normal appearance/no adnexal mass.   Left ovary  Measurements: 2.1 cm x 1.4 cm x 1.3 cm = volume: 2.0 mL. Normal appearance/no adnexal mass.   Other findings  No abnormal free fluid.   IMPRESSION:  Large amount of blood products blood products suspected throughout the endometrial canal. Correlation with 6-12 week  follow-up pelvic ultrasound is recommended to further exclude the presence of an underlying endometrial lesion.     Past Medical History:  Diagnosis Date  . Anxiety   . Arthritis   . Depression     History reviewed. No pertinent surgical history.  Gynecologic History: No LMP recorded. Patient is postmenopausal.  Obstetric History: G0P0000  Family History  Problem Relation Name Age of Onset  . Breast cancer Mother  55  . Colon cancer Mother  74  . Dementia Father    . Myocardial Infarction (Heart attack) Father    . Bladder Cancer Father    . Breast cancer Sister Olam   . Sleep apnea Sister Olam   . Kidney cancer Paternal Aunt    . Uterine cancer Maternal Grandmother    . Lung cancer Maternal Grandfather      Social History   Socioeconomic History  . Marital status: Widowed  Tobacco Use  . Smoking status: Former    Current packs/day: 0.00    Average packs/day: 0.5 packs/day for 40.0 years (20.0 ttl pk-yrs)    Types: Cigarettes    Start date: 55    Quit date: 2013  Years since quitting: 12.5  . Smokeless tobacco: Never  Vaping Use  . Vaping status: Never Used  Substance and Sexual Activity  . Alcohol use: Yes    Comment: wine few times per week  . Drug use: Not Currently    Comment: marijuana  . Sexual activity: Not Currently    Birth control/protection: None   Social Drivers of Health   Financial Resource Strain: Low Risk  (10/13/2023)   Overall Financial Resource Strain (CARDIA)   . Difficulty of Paying Living Expenses: Not very hard  Food Insecurity: No Food Insecurity (10/13/2023)   Hunger Vital Sign   .  Worried About Programme researcher, broadcasting/film/video in the Last Year: Never true   . Ran Out of Food in the Last Year: Never true  Recent Concern: Food Insecurity - Food Insecurity Present (09/27/2023)   Received from [facility]   Hunger Vital Sign   . Within the past 12 months, you worried that your food would run out before you got the money to buy more.: Sometimes true   . Within the past 12 months, the food you bought just didn't last and you didn't have money to get more.: Sometimes true  Transportation Needs: No Transportation Needs (10/13/2023)   PRAPARE - Transportation   . Lack of Transportation (Medical): No   . Lack of Transportation (Non-Medical): No  Recent Concern: Transportation Needs - Unmet Transportation Needs (09/27/2023)   Received from [facility] - Transportation   . In the past 12 months, has lack of transportation kept you from medical appointments or from getting medications?: Yes   . In the past 12 months, has lack of transportation kept you from meetings, work, or from getting things needed for daily living?: Yes  Social Connections: Socially Isolated (09/28/2023)   Received from [facility]   Social Connection and Isolation Panel   . In a typical week, how many times do you talk on the phone with family, friends, or neighbors?: Never   . How often do you get together with friends or relatives?: Never   . How often do you attend church or religious services?: Never   . Do you belong to any clubs or organizations such as church groups, unions, fraternal or athletic groups, or school groups?: No   . How often do you attend meetings of the clubs or organizations you belong to?: Never   . Are you married, widowed, divorced, separated, never married, or living with a partner?: Widowed  Housing Stability: Unknown (10/13/2023)   Housing Stability Vital Sign   . Unable to Pay for Housing in the Last Year: No   . Homeless in the Last Year: No    No Known Allergies  Prior to  Admission medications  Medication Sig Taking? Last Dose  DULoxetine (CYMBALTA) 30 MG DR capsule Take 1 capsule (30 mg total) by mouth once daily    medroxyPROGESTERone  (PROVERA ) 10 MG tablet Take 10 mg by mouth Patient not taking: Reported on 10/13/2023  Not Taking    Review of Systems  Genitourinary: Negative.      Physical Exam Ht 154.9 cm (5' 1)   Wt 55.7 kg (122 lb 12.8 oz)   BMI 23.20 kg/m  No LMP recorded. Patient is postmenopausal. Physical Exam Constitutional:      Appearance: Normal appearance.  Genitourinary:     No lesions in the vagina.     Genitourinary Comments: Due to limitations in external rotation of hips and due  to knee mobility limitations, the patient was placed in a position with hip and knee flexion at about 90 degrees. Two CMAs assisted with this (Adriana Lagunas and Danielle Corson). Cells collected for cytology.      Right Labia: No rash, tenderness, lesions, skin changes or Bartholin's cyst.    Left Labia: No tenderness, lesions, skin changes, Bartholin's cyst or rash.    No inguinal adenopathy present in the right or left side.    Pelvic Tanner Score: 5/5.    No vaginal discharge, erythema, tenderness or bleeding.     No vaginal prolapse present.    Moderate vaginal atrophy present.    Vaginal exam comments: On entry into vagina with speculum there was pooling of fluid in the vagina, somewhat mucoid. No obvious bleeding..     Cervical friability present.     No cervical motion tenderness, discharge, lesion or polyp.     Uterus is not enlarged or fixed.     No uterine mass detected.    Uterus is anteverted.     Pelvic exam was performed with patient in the lithotomy position.  HENT:     Head: Normocephalic and atraumatic.   Eyes:     General: No scleral icterus.    Conjunctiva/sclera: Conjunctivae normal.   Pulmonary:     Effort: Pulmonary effort is normal. No respiratory distress.  Abdominal:     Hernia: There is no hernia in the left inguinal  area or right inguinal area.   Musculoskeletal:        General: No swelling. Normal range of motion.  Lymphadenopathy:     Lower Body: No right inguinal adenopathy. No left inguinal adenopathy.   Neurological:     General: No focal deficit present.     Mental Status: She is alert and oriented to person, place, and time.     Cranial Nerves: No cranial nerve deficit.   Skin:    General: Skin is warm and dry.     Findings: No lesion.   Psychiatric:        Mood and Affect: Mood normal.        Behavior: Behavior normal.        Judgment: Judgment normal.  Vitals reviewed. Exam conducted with a chaperone present.   Endometrial Biopsy After discussion with the patient regarding her abnormal uterine bleeding I recommended that she proceed with an endometrial biopsy for further diagnosis. The risks, benefits, alternatives, and indications for an endometrial biopsy were discussed with the patient in detail. She understood the risks including infection, bleeding, cervical laceration and uterine perforation.  Verbal consent was obtained.   PROCEDURE NOTE:  Pipelle endometrial biopsy was performed using aseptic technique with iodine preparation.  The uterus was sounded to a length of 6.5 cm.  Adequate sampling was obtained. Of note, after the pap smear, there was moderate bleeding from the cervix. After the endometrial biopsy was performed, silver nitrate was applied to the outside and endocervical canal and ultimately hemostasis was obtained. The blood loss with this procedure was about 150 mL.    The patient tolerated the procedure well.  Disposition will be pending pathology.   PE Chaperone note: A chaperone was included for sensitive portions of the exam- staff member name: Bryce Jubilee, CMA.  Chaperone present for pelvic exam.   Assessment: 68 y.o. G0P0000 female here for  1. Postmenopausal bleeding   2. Vaginal bleeding   3. Cervical cancer screening      Plan: Problem List Items  Addressed This Visit     Vaginal bleeding   Relevant Orders   Pathology Report - [facility]   Other Visit Diagnoses       Postmenopausal bleeding    -  Primary     Cervical cancer screening       Relevant Orders   IGP, Aptima HPV, rfx 16/18,45 - [facility]      Assessment & Plan Postmenopausal Bleeding She experienced significant vaginal bleeding, leading to hospitalization and a hemoglobin drop to 5.8, necessitating a blood transfusion. The bleeding episode followed minor spotting and the use of a dildo, which may have caused irritation or a tear. However, the timing and severity suggest other potential causes, such as a polyp or endometrial cancer. Ultrasound showed a thin endometrial lining (3.9 mm) but indicated blood within the uterus, raising concerns about possible malignancy. A biopsy is recommended to rule out cancer, as the presence of blood in the uterus and the amount of bleeding are concerning. - Perform endometrial biopsy to rule out malignancy. - Perform Pap smear. - Advise to return to the hospital if heavy bleeding occurs. - Plan for a follow-up ultrasound in six weeks if there is no further bleeding, depending on biopsy and Pap smear results.  Anemia She was found to have severe anemia with a hemoglobin level of 5.8, which was life-threatening and required a blood transfusion. The anemia was likely secondary to the significant vaginal bleeding experienced. - Monitor hemoglobin levels and symptoms of anemia. - Advise to seek immediate medical attention if symptoms of severe anemia recur.  I independently reviewed the ultrasound. I would add to the assessment that in the cervical area, there is no endocervical stripe and the cervix does appear bulbous on ultrasound. The echogenicity is homogeneous, which could be simply tissue.  This could also raise concern for a lesion of the cervix.  The above image shows both the uterus and cervix.    This note has been created using  automated tools and reviewed for accuracy by STEPHEN DANIEL JACKSON.  MDM: This patient has an acute illness that does pose threat to life or bodily function, if her bleeding were to occur again.  I reviewed multiple hospital notes, labs, image reports.  I independently interpreted her ultrasound, especially pointing out the differences in my interpretation from that of the radiologist.     Attestation Statement:   I personally performed the service. (TP)  STEPHEN TORIBIO MACE, MD  [facility] 10/13/2023 1:15 PM

## 2023-10-28 ENCOUNTER — Inpatient Hospital Stay

## 2023-11-04 ENCOUNTER — Inpatient Hospital Stay

## 2023-11-04 ENCOUNTER — Encounter: Payer: Self-pay | Admitting: Obstetrics and Gynecology

## 2023-11-04 ENCOUNTER — Telehealth: Payer: Self-pay

## 2023-11-04 ENCOUNTER — Inpatient Hospital Stay (HOSPITAL_BASED_OUTPATIENT_CLINIC_OR_DEPARTMENT_OTHER): Admitting: Hospice and Palliative Medicine

## 2023-11-04 ENCOUNTER — Inpatient Hospital Stay: Attending: Obstetrics and Gynecology | Admitting: Obstetrics and Gynecology

## 2023-11-04 VITALS — BP 126/97 | HR 115 | Temp 98.6°F | Resp 20 | Wt 112.7 lb

## 2023-11-04 DIAGNOSIS — N950 Postmenopausal bleeding: Secondary | ICD-10-CM | POA: Diagnosis not present

## 2023-11-04 DIAGNOSIS — C541 Malignant neoplasm of endometrium: Secondary | ICD-10-CM

## 2023-11-04 DIAGNOSIS — E860 Dehydration: Secondary | ICD-10-CM

## 2023-11-04 DIAGNOSIS — Z79890 Hormone replacement therapy (postmenopausal): Secondary | ICD-10-CM | POA: Diagnosis not present

## 2023-11-04 DIAGNOSIS — M1990 Unspecified osteoarthritis, unspecified site: Secondary | ICD-10-CM | POA: Insufficient documentation

## 2023-11-04 DIAGNOSIS — Z79899 Other long term (current) drug therapy: Secondary | ICD-10-CM | POA: Insufficient documentation

## 2023-11-04 DIAGNOSIS — R791 Abnormal coagulation profile: Secondary | ICD-10-CM | POA: Diagnosis not present

## 2023-11-04 DIAGNOSIS — E871 Hypo-osmolality and hyponatremia: Secondary | ICD-10-CM | POA: Insufficient documentation

## 2023-11-04 DIAGNOSIS — D72829 Elevated white blood cell count, unspecified: Secondary | ICD-10-CM | POA: Diagnosis not present

## 2023-11-04 DIAGNOSIS — Z87891 Personal history of nicotine dependence: Secondary | ICD-10-CM | POA: Insufficient documentation

## 2023-11-04 DIAGNOSIS — D500 Iron deficiency anemia secondary to blood loss (chronic): Secondary | ICD-10-CM | POA: Insufficient documentation

## 2023-11-04 DIAGNOSIS — F1010 Alcohol abuse, uncomplicated: Secondary | ICD-10-CM | POA: Insufficient documentation

## 2023-11-04 DIAGNOSIS — I951 Orthostatic hypotension: Secondary | ICD-10-CM | POA: Diagnosis not present

## 2023-11-04 DIAGNOSIS — R413 Other amnesia: Secondary | ICD-10-CM | POA: Diagnosis not present

## 2023-11-04 DIAGNOSIS — Z602 Problems related to living alone: Secondary | ICD-10-CM | POA: Diagnosis not present

## 2023-11-04 DIAGNOSIS — E8809 Other disorders of plasma-protein metabolism, not elsewhere classified: Secondary | ICD-10-CM | POA: Diagnosis not present

## 2023-11-04 DIAGNOSIS — Z9181 History of falling: Secondary | ICD-10-CM | POA: Insufficient documentation

## 2023-11-04 DIAGNOSIS — Z515 Encounter for palliative care: Secondary | ICD-10-CM | POA: Diagnosis not present

## 2023-11-04 DIAGNOSIS — F32A Depression, unspecified: Secondary | ICD-10-CM | POA: Diagnosis not present

## 2023-11-04 DIAGNOSIS — R9431 Abnormal electrocardiogram [ECG] [EKG]: Secondary | ICD-10-CM | POA: Insufficient documentation

## 2023-11-04 DIAGNOSIS — D649 Anemia, unspecified: Secondary | ICD-10-CM | POA: Insufficient documentation

## 2023-11-04 DIAGNOSIS — Z7689 Persons encountering health services in other specified circumstances: Secondary | ICD-10-CM

## 2023-11-04 LAB — CBC WITH DIFFERENTIAL/PLATELET
Abs Immature Granulocytes: 0.06 K/uL (ref 0.00–0.07)
Basophils Absolute: 0.1 K/uL (ref 0.0–0.1)
Basophils Relative: 1 %
Eosinophils Absolute: 0 K/uL (ref 0.0–0.5)
Eosinophils Relative: 0 %
HCT: 26.1 % — ABNORMAL LOW (ref 36.0–46.0)
Hemoglobin: 8.1 g/dL — ABNORMAL LOW (ref 12.0–15.0)
Immature Granulocytes: 0 %
Lymphocytes Relative: 17 %
Lymphs Abs: 2.6 K/uL (ref 0.7–4.0)
MCH: 25.8 pg — ABNORMAL LOW (ref 26.0–34.0)
MCHC: 31 g/dL (ref 30.0–36.0)
MCV: 83.1 fL (ref 80.0–100.0)
Monocytes Absolute: 1 K/uL (ref 0.1–1.0)
Monocytes Relative: 7 %
Neutro Abs: 11.4 K/uL — ABNORMAL HIGH (ref 1.7–7.7)
Neutrophils Relative %: 75 %
Platelets: 603 K/uL — ABNORMAL HIGH (ref 150–400)
RBC: 3.14 MIL/uL — ABNORMAL LOW (ref 3.87–5.11)
RDW: 15.4 % (ref 11.5–15.5)
WBC: 15.2 K/uL — ABNORMAL HIGH (ref 4.0–10.5)
nRBC: 0 % (ref 0.0–0.2)

## 2023-11-04 LAB — COMPREHENSIVE METABOLIC PANEL WITH GFR
ALT: 11 U/L (ref 0–44)
AST: 20 U/L (ref 15–41)
Albumin: 2.8 g/dL — ABNORMAL LOW (ref 3.5–5.0)
Alkaline Phosphatase: 93 U/L (ref 38–126)
Anion gap: 10 (ref 5–15)
BUN: 8 mg/dL (ref 8–23)
CO2: 23 mmol/L (ref 22–32)
Calcium: 8.7 mg/dL — ABNORMAL LOW (ref 8.9–10.3)
Chloride: 98 mmol/L (ref 98–111)
Creatinine, Ser: 0.51 mg/dL (ref 0.44–1.00)
GFR, Estimated: >60 mL/min (ref >60)
Glucose, Bld: 98 mg/dL (ref 70–99)
Potassium: 3.5 mmol/L (ref 3.5–5.1)
Sodium: 131 mmol/L — ABNORMAL LOW (ref 135–145)
Total Bilirubin: 0.4 mg/dL (ref 0.0–1.2)
Total Protein: 7.9 g/dL (ref 6.5–8.1)

## 2023-11-04 LAB — PROTIME-INR
INR: 1.1 (ref 0.8–1.2)
Prothrombin Time: 15.3 s — ABNORMAL HIGH (ref 11.4–15.2)

## 2023-11-04 LAB — VITAMIN B12: Vitamin B-12: 371 pg/mL (ref 180–914)

## 2023-11-04 LAB — AMMONIA: Ammonia: 16 umol/L (ref 9–35)

## 2023-11-04 LAB — FOLATE: Folate: 11 ng/mL (ref >5.9)

## 2023-11-04 LAB — APTT: aPTT: 44 s — ABNORMAL HIGH (ref 24–36)

## 2023-11-04 MED ORDER — SODIUM CHLORIDE 0.9 % IV SOLN
INTRAVENOUS | Status: DC
  Filled 2023-11-04 (×2): qty 250

## 2023-11-04 NOTE — Progress Notes (Signed)
 Gynecologic Oncology Consult Visit   Referring Provider: Dr. Garnette Mace  Chief Concern: Endometrial cancer  Subjective:  Valerie Leonard is a 68 y.o. female who is seen in consultation from Dr. Mace for grade 2 endometrial cancer.   She initially presented with postmenopausal vaginal bleeding. She experienced significant vaginal bleeding, severe enough to require emergency medical attention.    During her hospital stay, she underwent an ultrasound and received medication that successfully stopped the bleeding but caused xerostomia. She also required a blood transfusion due to a hemoglobin level of 5.8 g/dL, attributed to the significant blood loss. Hemoglobin on discharge: 8.4 (s/p 2 units pRBCs).   Pelvic ultrasound 09/27/2023:  FINDINGS:  Uterus  Measurements: 8.7 cm x 3.4 cm x 4.4 cm = volume: 68 mL. No fibroids or other mass visualized.   Endometrium  Thickness: 3.9 mm. A large amount of blood products are suspected throughout the endometrial canal.   Right ovary  Measurements: 2.1 cm x 1.2 cm x 1.4 cm = volume: 1.8 mL. Normal appearance/no adnexal mass.   Left ovary  Measurements: 2.1 cm x 1.4 cm x 1.3 cm = volume: 2.0 mL. Normal appearance/no adnexal mass.   Other findings  No abnormal free fluid.   IMPRESSION:  Large amount of blood products blood products suspected throughout the endometrial canal. Correlation with 6-12 week  follow-up pelvic ultrasound is recommended to further exclude the presence of an underlying endometrial lesion.     Endometrial Biopsy: MODERATELY DIFFERENTIATED  ENDOMETRIOID ADENOCARCINOMA (FIGO II).   P16, patchy staining   ER, positive  wild-type staining for P53 Mismatch repair  MLH1-Loss of nuclear expression  MSH2-intact nuclear expression  MSH6-intact nuclear expression  PMS2-Loss of nuclear expression  Testing for methylation of MLH1 promoter will be requested  She has not seen a doctor in several years.  She does drink  alcohol approximately 3 beers a day, may have early symptoms of dementia. She utilizes a walker and did have a fall down her front steps previously.  She is at a fall risk.    Her EKG in the ER was concerning for possible prior infarct.  She stated that she did have feelings of shortness of breath during that event but did not have definitive symptoms of a heart attack before.  Her troponin level was normal. She also has left knee pain and a few years ago had a x-ray of the knee that was consistent with arthritis.  Physical therapy was recommended.  She has not had subsequent follow-up.  She does not have a primary care physician.   We discussed alcohol use and in terms of the CAGE questions she answered the concern as affirmative.  But the other questions were all negative. She has never gone through withdrawal symptoms even when she has not been drinking alcohol for a prolonged period of time.   She has a very supportive family including a sister who came to the visit with her today and her brother.  Her sister Valerie Leonard attended during her visit today.   Problem List: Patient Active Problem List   Diagnosis Date Noted   ABLA (acute blood loss anemia) 09/28/2023   Pressure injury of skin 09/28/2023   Vaginal bleeding 09/27/2023   Severe major depression, single episode, without psychotic features (HCC) 07/26/2020   Depression    Acute delirium 07/25/2020   Acute metabolic encephalopathy    Hypomagnesemia    Chronic alcohol abuse    Hypokalemia 07/24/2020    Past  Medical History: Past Medical History:  Diagnosis Date   Acute delirium    Acute metabolic encephalopathy    Anemia    blood loss anemia   Chronic alcohol abuse    Depression    Endometrial cancer (HCC)    Hypokalemia    Hypomagnesemia    Pressure injury of skin    Severe major depression, single episode, without psychotic features (HCC)    Vaginal bleeding     Past Surgical History: No prior surgeries History  reviewed. No pertinent surgical history.  Past Gynecologic History:  Last Menstrual Period: 61 History of Abnormal pap: no,  Last pap: unknown Sexually active: no  OB History:  OB History  No obstetric history on file.    Family History: History reviewed. No pertinent family history.  Social History: Social History   Socioeconomic History   Marital status: Single    Spouse name: Not on file   Number of children: Not on file   Years of education: Not on file   Highest education level: Not on file  Occupational History   Not on file  Tobacco Use   Smoking status: Former    Types: Cigarettes, Cigars   Smokeless tobacco: Never  Vaping Use   Vaping status: Never Used  Substance and Sexual Activity   Alcohol use: Yes    Comment: More than 4 glasses of wine, mixed drinks, beer daily.    Drug use: Not Currently    Comment: former use of marijuana   Sexual activity: Not Currently    Birth control/protection: None  Other Topics Concern   Not on file  Social History Narrative   Not on file   Social Drivers of Health   Financial Resource Strain: Low Risk  (10/13/2023)   Received from [facility]   Overall Financial Resource Strain (CARDIA)    Difficulty of Paying Living Expenses: Not very hard  Food Insecurity: No Food Insecurity (10/13/2023)   Received from [facility]   Hunger Vital Sign    Within the past 12 months, you worried that your food would run out before you got the money to buy more.: Never true    Within the past 12 months, the food you bought just didn't last and you didn't have money to get more.: Never true  Recent Concern: Food Insecurity - Food Insecurity Present (09/27/2023)   Hunger Vital Sign    Worried About Running Out of Food in the Last Year: Sometimes true    Ran Out of Food in the Last Year: Sometimes true  Transportation Needs: No Transportation Needs (10/13/2023)   Received from [facility]    PRAPARE - Transportation    In the past 12 months, has lack of transportation kept you from medical appointments or from getting medications?: No    Lack of Transportation (Non-Medical): No  Recent Concern: Transportation Needs - Unmet Transportation Needs (09/27/2023)   PRAPARE - Transportation    Lack of Transportation (Medical): Yes    Lack of Transportation (Non-Medical): Yes  Physical Activity: Not on file  Stress: Not on file  Social Connections: Socially Isolated (09/28/2023)   Social Connection and Isolation Panel    Frequency of Communication with Friends and Family: Never    Frequency of Social Gatherings with Friends and Family: Never    Attends Religious Services: Never    Database administrator or Organizations: No    Attends Banker Meetings: Never  Marital Status: Widowed  Intimate Partner Violence: Not At Risk (09/27/2023)   Humiliation, Afraid, Rape, and Kick questionnaire    Fear of Current or Ex-Partner: No    Emotionally Abused: No    Physically Abused: No    Sexually Abused: No    Allergies: No Known Allergies  Current Medications: Current Outpatient Medications  Medication Sig Dispense Refill   Multiple Vitamin (MULTIVITAMIN WITH MINERALS) TABS tablet Take 1 tablet by mouth daily. 30 tablet 1   citalopram  (CELEXA ) 20 MG tablet Take 1 tablet (20 mg total) by mouth daily. (Patient not taking: Reported on 11/04/2023) 60 tablet 1   folic acid  (FOLVITE ) 1 MG tablet Take 1 tablet (1 mg total) by mouth daily. (Patient not taking: Reported on 11/04/2023) 30 tablet 0   medroxyPROGESTERone  (PROVERA ) 10 MG tablet Take 1 tablet (10 mg total) by mouth 2 (two) times daily for 7 days. (Patient not taking: Reported on 11/04/2023) 14 tablet 0   No current facility-administered medications for this visit.    Review of Systems General: weight loss; o/w negative for fevers, or night sweats Skin: negative for changes in moles or sores or rash Eyes: negative for  changes in vision HEENT: negative for change in hearing, tinnitus, voice changes Pulmonary: negative for dyspnea, orthopnea, productive cough, wheezing Cardiac: negative for palpitations, pain Gastrointestinal: negative for nausea, vomiting, constipation, diarrhea, hematemesis, hematochezia Genitourinary/Sexual: negative for dysuria, retention, hematuria, incontinence Ob/Gyn:  abnormal bleeding Musculoskeletal: negative for pain, joint pain, back pain Hematology: negative for easy bruising, abnormal bleeding Neurologic/Psych: headaches and dizziness o/w negative for seizures, paralysis, weakness, numbness   CAGE: Positive concerns. Negative for AGE.   Objective:  Physical Examination:  BP (!) 126/97   Pulse (!) 115   Temp 98.6 F (37 C)   Resp 20   Wt 112 lb 11.2 oz (51.1 kg)   SpO2 100%   BMI 21.29 kg/m    ECOG Performance Status: 2 - Symptomatic, <50% confined to bed  GENERAL: Elderly appearing female in no acute distress; concern for memory related issues HEENT:  PERRL. Thyroid without masses.  NODES:  No cervical, supraclavicular, axillary, or inguinal lymphadenopathy palpated.  LUNGS:  Clear to auscultation bilaterally.  No wheezes or rhonchi. HEART:  Regular rate and rhythm. ABDOMEN:  Soft, nontender.  Nondistended. No ascites. No masses. EXTREMITIES:  No peripheral edema.   SKIN:  Clear with no obvious rashes or skin changes.  NEURO:  Nonfocal. Well oriented.  Appropriate affect.  Pelvic chaperoned by CMA. 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 Unable to determine size or mobility. Patient unable to tolerate exam well Adnexa: normal adnexa in size, nontender and no masses; but limited by exam   Orthostatic  vital signs: Laying blood pressure 95/65 pulse 103  Sitting blood pressure 112/71 pulse 113  Standing blood pressure 89/63 pulse 117  Lab Review Labs on site today: Lab Results  Component Value Date   WBC 12.1 (H) 09/29/2023   HGB 8.4 (L) 09/29/2023   HCT 26.7 (L) 09/29/2023   MCV 87.8 09/29/2023   PLT 445 (H) 09/29/2023   Lab Results  Component Value Date   NA 140 09/29/2023   CL 108 09/29/2023   K 4.2 09/29/2023   CO2 24 09/29/2023  BUN 8 09/29/2023   CREATININE 0.39 (L) 09/29/2023   GFRNONAA >60 09/29/2023   CALCIUM  8.4 (L) 09/29/2023   PHOS 2.6 07/26/2020   ALBUMIN 2.6 (L) 09/27/2023   GLUCOSE 102 (H) 09/29/2023   Lab Results  Component Value Date   ALT 9 09/27/2023   AST 25 09/27/2023   ALKPHOS 52 09/27/2023   BILITOT 0.6 09/27/2023   HIV nonreactive Troponin normal      Component Ref Range & Units (hover) 1 mo ago 3 yr ago  Prothrombin Time 16.0 High  16.4 High   INR 1.3 High  1.3 High  CM  Comment: (NOTE)       EKG 09/28/2023 8:59:50 AM Sinus tachycardia Borderline low voltage, extremity leads Probable anteroseptal infarct, old Prolonged QT interval   Radiologic Imaging: as per HPI 09/28/2023 PORTABLE CHEST 1 VIEW COMPARISON:  09/27/2023 FINDINGS: Stable cardiomediastinal silhouette. No focal consolidation, pleural effusion, or pneumothorax. No displaced rib fractures.   IMPRESSION: No active disease.     Assessment:  SHARRA CAYABYAB is a 67 y.o. female diagnosed with grade 2 endometrial cancer, dMMR, p53wt, ER+.   Anemia s/p blood transfusion x 2 secondary to vaginal bleeding from endometrial cancer. Dizziness and orthostatic hypotension possibly due to persistent anemia  Hypoalbuminemia  Possible hypocalcemia  Elevated PT  Alcohol use  Possible prior MI based on EKG  Leukocytosis uncertain etiology    Medical co-morbidities complicating care: As Above Plan:   Problem List Items Addressed This Visit       Other    Chronic alcohol abuse - Primary   Relevant Orders   CT CHEST ABDOMEN PELVIS W CONTRAST   CBC with Differential/Platelet   Comprehensive metabolic panel with GFR   Vitamin B12   Folate   Vitamin B1   Vitamin B6   Vitamin K1, Serum   Protime-INR   APTT   Calcium , ionized   Other Visit Diagnoses       Endometrial cancer (HCC)       Relevant Orders   CT CHEST ABDOMEN PELVIS W CONTRAST   CBC with Differential/Platelet   Comprehensive metabolic panel with GFR   Vitamin B12   Folate   Vitamin B1   Vitamin B6   Vitamin K1, Serum   Protime-INR   APTT   Calcium , ionized       We discussed options for management including surgery, radiation, and hormonal therapy. Based on exam and history, we recommend a CT scan C/A/P.  We are obtaining repeat labs including CBC and CMP.  Will obtain vitamin B6 B12 folate thiamine  and K.  PT PTT, ionized calcium , and ammonia. ECHO will be obtained given abnormal EKG and concern for deconditioning. She will return next week for discussion of her results.  Symptom management clinic today to help with her orthostatic symptoms.  Also refer to nutrition for hypoalbuminemia.  She is aware that she may need another blood transfusion pending the CBC results.   If she does have surgery will need to discuss SNF placement postoperatively as her family cannot provide the needed care for her even if this is a minimally invasive approach.   The patient's diagnosis, an outline of the further diagnostic and laboratory studies which will be required, the recommendation, and alternatives were discussed.  All questions were answered to the patient's satisfaction.  A total of 90 minutes were spent with the patient/family today; >50% was spent in education, counseling and coordination of care for endometrial cancer.    Lita Flynn  Valerie Constable, MD  Testing for methylation of MLH1 promoter will be requested   CC:  Garnette JONETTA Mace,  MD [address] [place],  [place] [address] [phone]

## 2023-11-04 NOTE — Progress Notes (Signed)
 MLH1 promotor methylation requested on endometrial biopsy.

## 2023-11-04 NOTE — Telephone Encounter (Signed)
 Clinical Social Work was referred by medical provider for assessment of psychosocial needs.  CSW attempted to contact patient by phone.  Left voicemail with contact information and request for return call.

## 2023-11-04 NOTE — Progress Notes (Addendum)
 Symptom Management Clinic [facility] [phone] [phone]  Patient Care Team: Pcp, No as PCP - General Maurie Rayfield BIRCH, RN as Oncology Nurse Navigator   NAME OF PATIENT: Valerie Leonard  [id number]  [birth year]   DATE OF VISIT: 11/04/23  REASON FOR CONSULT: MARIELIZ STRANG is a 68 y.o. female with multiple medical problems including depression, memory impairment, and chronic alcohol use, who was recently hospitalized 09/27/2023 to 09/29/2023 with uterine bleeding requiring multiple transfusions of blood.   INTERVAL HISTORY: Patient had outpatient follow-up today with GYN oncology for workup of vaginal bleeding.  Patient was found to be orthostatic.  She was an add-on to [facility] for evaluation of labs and consideration of possible transfusion.  Denies any neurologic complaints. Denies recent fevers or illnesses. Denies any easy bleeding or bruising. Reports fair appetite and denies weight loss. Denies chest pain. Denies any nausea, vomiting, constipation, or diarrhea. Denies urinary complaints. Patient offers no further specific complaints today.   PAST MEDICAL HISTORY: Past Medical History:  Diagnosis Date   Acute delirium    Acute metabolic encephalopathy    Anemia    blood loss anemia   Chronic alcohol abuse    Depression    Endometrial cancer (HCC)    Hypokalemia    Hypomagnesemia    Pressure injury of skin    Severe major depression, single episode, without psychotic features (HCC)    Vaginal bleeding     PAST SURGICAL HISTORY: No past surgical history on file.  HEMATOLOGY/ONCOLOGY HISTORY:  Oncology History   No history exists.    ALLERGIES:  has no known allergies.  MEDICATIONS:  Current Outpatient Medications  Medication Sig Dispense Refill   citalopram  (CELEXA ) 20 MG tablet Take 1 tablet (20 mg total) by mouth daily. (Patient not taking: Reported on 11/04/2023) 60 tablet 1   folic acid  (FOLVITE ) 1 MG  tablet Take 1 tablet (1 mg total) by mouth daily. (Patient not taking: Reported on 11/04/2023) 30 tablet 0   medroxyPROGESTERone  (PROVERA ) 10 MG tablet Take 1 tablet (10 mg total) by mouth 2 (two) times daily for 7 days. (Patient not taking: Reported on 11/04/2023) 14 tablet 0   Multiple Vitamin (MULTIVITAMIN WITH MINERALS) TABS tablet Take 1 tablet by mouth daily. 30 tablet 1   No current facility-administered medications for this visit.    VITAL SIGNS: There were no vitals taken for this visit. There were no vitals filed for this visit.  Estimated body mass index is 21.29 kg/m as calculated from the following:   Height as of 01/18/21: 5' 1 (1.549 m).   Weight as of an earlier encounter on 11/04/23: 112 lb 11.2 oz (51.1 kg).  LABS: CBC:    Component Value Date/Time   WBC 15.2 (H) 11/04/2023 1030   HGB 8.1 (L) 11/04/2023 1030   HCT 26.1 (L) 11/04/2023 1030   PLT 603 (H) 11/04/2023 1030   MCV 83.1 11/04/2023 1030   NEUTROABS 11.4 (H) 11/04/2023 1030   LYMPHSABS 2.6 11/04/2023 1030   MONOABS 1.0 11/04/2023 1030   EOSABS 0.0 11/04/2023 1030   BASOSABS 0.1 11/04/2023 1030   Comprehensive Metabolic Panel:    Component Value Date/Time   NA 140 09/29/2023 0411   K 4.2 09/29/2023 0411   CL 108 09/29/2023 0411   CO2 24 09/29/2023 0411   BUN 8 09/29/2023 0411   CREATININE 0.39 (L) 09/29/2023 0411   GLUCOSE 102 (H) 09/29/2023 0411   CALCIUM  8.4 (L) 09/29/2023 0411  AST 25 09/27/2023 1406   ALT 9 09/27/2023 1406   ALKPHOS 52 09/27/2023 1406   BILITOT 0.6 09/27/2023 1406   PROT 6.5 09/27/2023 1406   ALBUMIN 2.6 (L) 09/27/2023 1406    RADIOGRAPHIC STUDIES: No results found.  PERFORMANCE STATUS (ECOG) : 1 - Symptomatic but completely ambulatory  Review of Systems Unless otherwise noted, a complete review of systems is negative.  Physical Exam General: NAD Cardiovascular: regular rate and rhythm Pulmonary: clear ant fields Abdomen: soft, nontender, + bowel sounds GU: no  suprapubic tenderness Extremities: no edema, no joint deformities Skin: no rashes Neurological: Weakness but otherwise nonfocal  IMPRESSION/PLAN: Uterine bleeding -patient pending workup with CT chest abdomen pelvis  Hyponatremia -patient orthostatic so we will proceed with gentle IV fluids today.  Patient recommended sobriety as alcohol consumption might also be a factor.  Anemia -patient reports scant vaginal bleeding.  Hemoglobin down slightly to 8.1 from 8.9 one-month ago.  Discussed with Dr. Elby who plans possible surgery in 2 weeks.  Goal hemoglobin of 10.  Will proceed with transfusion of 1 unit PRBC.  Will also give Venofer 200 mg x 1.  Risks and benefits reviewed with patient and sister who verbalized consent.  Depression -previously on Celexa  but not currently taking.  Will refer to [facility].  Patient tearful as she discussed losing her husband to cancer many years ago.    Social -patient lives at home alone.  She does have some memory impairment.  Her sister lives 20 minutes away but checks on her regularly.  Patient also has several neighbors who are involved.  Patient has no children.  Will refer to social work as patient likely to require additional resources going forward.  Sister is completing advance directives.  Case and plan discussed with Dr. Elby.  Patient to return next week.  Patient expressed understanding and was in agreement with this plan. She also understands that She can call clinic at any time with any questions, concerns, or complaints.   Thank you for allowing me to participate in the care of this very pleasant patient.   Time Total: 20 minutes  Visit consisted of counseling and education dealing with the complex and emotionally intense issues of symptom management in the setting of serious illness.Greater than 50%  of this time was spent counseling and coordinating care related to the above assessment and plan.  Signed by: Fonda Mower, PhD,  NP-C

## 2023-11-04 NOTE — Addendum Note (Signed)
 Addended by: SHERLEEN CHEW R on: 11/04/2023 03:00 PM   Modules accepted: Orders

## 2023-11-04 NOTE — Patient Instructions (Signed)
 You have been referred to [facility] cardiology for ECHO You have been referred to Primary Care at [facility] You have been referred to dietician at the cancer center to optimize nutrition for surgery We will see you back 8/6 at 1200 to go CT scan

## 2023-11-05 ENCOUNTER — Telehealth: Payer: Self-pay

## 2023-11-05 ENCOUNTER — Telehealth: Payer: Self-pay | Admitting: Hospice and Palliative Medicine

## 2023-11-05 ENCOUNTER — Encounter

## 2023-11-05 LAB — CALCIUM, IONIZED: Calcium, Ionized, Serum: 5 mg/dL (ref 4.5–5.6)

## 2023-11-05 NOTE — Telephone Encounter (Signed)
 Patient's sister called this morning to confirm next weeks blood transfusion appt.  Upon review of the chart she needs to be scheduled for lab tomorrow with blood transfusion on Monday 11/09/23.  Spoke with Tinnie Dawn, NP who would like for patient to be scheduled for lab tomorrow and blood transfusion on Monday.  Patient sister Olam is aware of appt changes.

## 2023-11-05 NOTE — Telephone Encounter (Signed)
 Was asked by PA to call pt sister with added appt days/times. Left voicemail with new appt info. (Added infusion and blood transfusion) - LH

## 2023-11-06 ENCOUNTER — Inpatient Hospital Stay

## 2023-11-06 ENCOUNTER — Inpatient Hospital Stay: Attending: Obstetrics and Gynecology

## 2023-11-06 ENCOUNTER — Ambulatory Visit
Admission: RE | Admit: 2023-11-06 | Discharge: 2023-11-06 | Disposition: A | Discharge status: Home / Self Care | Source: Ambulatory Visit | Attending: Obstetrics and Gynecology | Admitting: Obstetrics and Gynecology

## 2023-11-06 DIAGNOSIS — F1010 Alcohol abuse, uncomplicated: Secondary | ICD-10-CM | POA: Insufficient documentation

## 2023-11-06 DIAGNOSIS — D649 Anemia, unspecified: Secondary | ICD-10-CM | POA: Insufficient documentation

## 2023-11-06 DIAGNOSIS — C541 Malignant neoplasm of endometrium: Secondary | ICD-10-CM | POA: Insufficient documentation

## 2023-11-06 DIAGNOSIS — R9431 Abnormal electrocardiogram [ECG] [EKG]: Secondary | ICD-10-CM

## 2023-11-06 LAB — CBC WITH DIFFERENTIAL (CANCER CENTER ONLY)
Abs Immature Granulocytes: 0.05 K/uL (ref 0.00–0.07)
Basophils Absolute: 0.1 K/uL (ref 0.0–0.1)
Basophils Relative: 1 %
Eosinophils Absolute: 0 K/uL (ref 0.0–0.5)
Eosinophils Relative: 0 %
HCT: 26.1 % — ABNORMAL LOW (ref 36.0–46.0)
Hemoglobin: 7.9 g/dL — ABNORMAL LOW (ref 12.0–15.0)
Immature Granulocytes: 0 %
Lymphocytes Relative: 14 %
Lymphs Abs: 2 K/uL (ref 0.7–4.0)
MCH: 25.2 pg — ABNORMAL LOW (ref 26.0–34.0)
MCHC: 30.3 g/dL (ref 30.0–36.0)
MCV: 83.1 fL (ref 80.0–100.0)
Monocytes Absolute: 1 K/uL (ref 0.1–1.0)
Monocytes Relative: 7 %
Neutro Abs: 11.4 K/uL — ABNORMAL HIGH (ref 1.7–7.7)
Neutrophils Relative %: 78 %
Platelet Count: 564 K/uL — ABNORMAL HIGH (ref 150–400)
RBC: 3.14 MIL/uL — ABNORMAL LOW (ref 3.87–5.11)
RDW: 15.4 % (ref 11.5–15.5)
WBC Count: 14.6 K/uL — ABNORMAL HIGH (ref 4.0–10.5)
nRBC: 0 % (ref 0.0–0.2)

## 2023-11-06 LAB — COMPREHENSIVE METABOLIC PANEL WITH GFR
ALT: 13 U/L (ref 0–44)
AST: 29 U/L (ref 15–41)
Albumin: 2.6 g/dL — ABNORMAL LOW (ref 3.5–5.0)
Alkaline Phosphatase: 86 U/L (ref 38–126)
Anion gap: 9 (ref 5–15)
BUN: 10 mg/dL (ref 8–23)
CO2: 23 mmol/L (ref 22–32)
Calcium: 8.5 mg/dL — ABNORMAL LOW (ref 8.9–10.3)
Chloride: 100 mmol/L (ref 98–111)
Creatinine, Ser: 0.55 mg/dL (ref 0.44–1.00)
GFR, Estimated: >60 mL/min (ref >60)
Glucose, Bld: 123 mg/dL — ABNORMAL HIGH (ref 70–99)
Potassium: 3.8 mmol/L (ref 3.5–5.1)
Sodium: 132 mmol/L — ABNORMAL LOW (ref 135–145)
Total Bilirubin: 0.3 mg/dL (ref 0.0–1.2)
Total Protein: 7.3 g/dL (ref 6.5–8.1)

## 2023-11-06 LAB — VITAMIN B1: Vitamin B1 (Thiamine): 130.9 nmol/L (ref 66.5–200.0)

## 2023-11-06 LAB — SAMPLE TO BLOOD BANK

## 2023-11-06 LAB — VITAMIN B6: Vitamin B6: 4.6 ug/L (ref 3.4–65.2)

## 2023-11-06 LAB — PREPARE RBC (CROSSMATCH)

## 2023-11-06 LAB — VITAMIN K1, SERUM: VITAMIN K1: 0.13 ng/mL (ref 0.10–2.20)

## 2023-11-06 LAB — ABO/RH: ABO/RH(D): O POS

## 2023-11-06 MED ORDER — IOHEXOL 300 MG/ML  SOLN
100.0000 mL | Freq: Once | INTRAMUSCULAR | Status: AC | PRN
  Administered 2023-11-06: 100 mL via INTRAVENOUS

## 2023-11-06 NOTE — Progress Notes (Signed)
 [facility] Clinical Social Work  Clinical Social Work was referred by medical provider for emotional support.  Clinical Social Worker contacted caregiver by phone to offer support and assess for needs.     Interventions: CSW spoke with patient's sister, Valerie Leonard.  Informed her that Valerie Leonard has asked CSW to contact patient to assess for psychosocial needs and to provide support.  Patient was at an appointment and could not talk on the phone.  CSW left a second message on patient's voicemail previously.  Engineer, drilling and CSW contact information.      Follow Up Plan:  CSW will follow-up with patient by phone     Valerie CHRISTELLA Au, LCSW  Clinical Social Worker [facility]

## 2023-11-09 ENCOUNTER — Inpatient Hospital Stay

## 2023-11-09 ENCOUNTER — Telehealth: Payer: Self-pay

## 2023-11-09 DIAGNOSIS — C541 Malignant neoplasm of endometrium: Secondary | ICD-10-CM | POA: Diagnosis not present

## 2023-11-09 DIAGNOSIS — D649 Anemia, unspecified: Secondary | ICD-10-CM

## 2023-11-09 MED ORDER — SODIUM CHLORIDE 0.9% IV SOLUTION
250.0000 mL | INTRAVENOUS | Status: DC
  Administered 2023-11-09: 100 mL via INTRAVENOUS
  Filled 2023-11-09: qty 250

## 2023-11-09 MED ORDER — DIPHENHYDRAMINE HCL 25 MG PO CAPS
25.0000 mg | ORAL_CAPSULE | Freq: Once | ORAL | Status: AC
  Administered 2023-11-09: 25 mg via ORAL
  Filled 2023-11-09: qty 1

## 2023-11-09 MED ORDER — ACETAMINOPHEN 325 MG PO TABS
650.0000 mg | ORAL_TABLET | Freq: Once | ORAL | Status: AC
  Administered 2023-11-09: 650 mg via ORAL
  Filled 2023-11-09: qty 2

## 2023-11-09 NOTE — Telephone Encounter (Signed)
 Clinical Social Work was referred by medical provider for assessment of psychosocial needs.  CSW attempted to contact patient by phone a second time.  Left voicemail with contact information and request for return call.

## 2023-11-10 ENCOUNTER — Telehealth: Payer: Self-pay

## 2023-11-10 LAB — TYPE AND SCREEN
ABO/RH(D): O POS
Antibody Screen: NEGATIVE
Unit division: 0

## 2023-11-10 LAB — BPAM RBC
Blood Product Expiration Date: 202508202359
ISSUE DATE / TIME: 202508041337
Unit Type and Rh: 5100

## 2023-11-10 NOTE — Telephone Encounter (Signed)
 Radiology read requested on 11/06/2023 CT in preparation for appointment 11/11/23 with gyn oncology.

## 2023-11-10 NOTE — Progress Notes (Signed)
 New Patient Visit   Chief Complaint: No chief complaint on file.  Date of Service: 11/17/2023 Date of Birth: Feb 12, 1956 PCP: Franchot Edsel Caldron, PA   History of Present Illness: Ms. Doering is a 68 y.o. female patient with PMH of depression, alcohol use, recently diagnosed endometrial cancer who was referred for POC as she may require surgery and reportedly had an abnormal EKG during hospitalization in June 2025. She was last seen by myself on 11/10/2023, since her appointment she completed an echocardiogram.   She presented to clinic today stating she has been doing well overall since her last appointment. States she has had some trouble sleeping but otherwise has no complaints. No bleeding issues since last appointment. Denies any recurrence of CP or SOB symptoms. Hgb improved on last check, significantly iron  deficient and is scheduled for an infusion tomorrow. HR elevated today. BP borderline without dizziness, lightheadedness.   Discussed her echo results in detail. She saw oncology last week, they are planning to start with chemotherapy and are holding off on scheduling hysterectomy at this time.   Past Medical and Surgical History  Past Medical History:  Past Medical History:  Diagnosis Date  . Anxiety   . Arthritis   . Depression   . Endometrial cancer (CMS/HHS-HCC) 10/2023  . History of cancer     Past Surgical History:  She  has no past surgical history on file.   Medications and Allergies  Current Medications:  Current Outpatient Medications  Medication Sig Dispense Refill  . spironolactone (ALDACTONE) 25 MG tablet Take 0.5 tablets (12.5 mg total) by mouth once daily 15 tablet 11   No current facility-administered medications for this visit.    Allergies: Patient has no known allergies.  Social and Family History  Social History:   reports that she quit smoking about 12 years ago. Her smoking use included cigarettes. She started smoking about 52 years ago. She has a 20  pack-year smoking history. She has never used smokeless tobacco. She reports current alcohol use. She reports that she does not currently use drugs.  Family History:  Family History  Problem Relation Name Age of Onset  . Breast cancer Mother  63  . Colon cancer Mother  66  . Dementia Father    . Myocardial Infarction (Heart attack) Father    . Bladder Cancer Father    . Breast cancer Sister Olam   . Sleep apnea Sister Olam   . Kidney cancer Paternal Aunt    . Uterine cancer Maternal Grandmother    . Lung cancer Maternal Grandfather      Review of Systems  Positive for none Review of Systems: negative for weight gain, weight loss, weakness, fatigue, vision change, hearing loss, cough, congestion, PND, orthopnea, heartburn, nausea, vomiting, diarrhea, bloody stools, melena, stomach pain, leg weakness, leg pain, leg blood clots leg cramping, headache, blackouts, nosebleed, trouble swallowing, frequent urination, urination at night, skin rashes, skin lesions, muscle weakness, numbness, tingling, anxiety, depression  Physical Examination  Vitals:BP 102/62 (BP Location: Left upper arm, Patient Position: Sitting, BP Cuff Size: Adult)   Pulse (!) 129   Resp 12   Ht 154.9 cm (5' 1)   Wt 51.7 kg (114 lb)   SpO2 97%   BMI 21.54 kg/m  Ht: 154.9 cm (5' 1) Wt: 51.7 kg (114 lb) BSA: Body surface area is 1.49 meters squared. Body mass index is 21.54 kg/m.  Appearance: Well appearing, no acute distress. HEENT: Pupils equally reactive to light and accomodation, no  apparent xanthelasma or other lesions. Neck: Supple without apparent masses or lymphadenopathy. Lungs: Normal respiratory effort. No wheezes, no crackles, no rhonchi. Heart: Regular rate and rhythm. Normal S1, S2. No gallops, murmurs, no rub. PMI normal size and placement. Carotid upstroke normal without bruit. Jugular venous pressure is normal. Abdomen: Soft, nontender, non-distended, with normal bowel sounds. Abdominal aorta is  normal size without bruit. No apparent masses. Extremities: No edema, no cyanosis, no clubbing, no ulcers. Peripheral Pulses: 2+ in all extremities, 2+ femoral pulses bilaterally, 2+ DP pulses. Musculoskeletal:  Normal muscle tone without kyphosis. Neurological: Alert and oriented to time, place, and person.  Assessment   68 y.o. female with  Encounter Diagnoses  Name Primary?  . Preop cardiovascular exam Yes  . Abnormal EKG   . Tachycardia   . Shortness of breath   . Abnormal echocardiogram   . ABLA (acute blood loss anemia)    Plan   Preoperative cardiac evaluation: Patient recently diagnosed with endometrial cancer, referred for POC as oncology is planning for hysterectomy but this has not been scheduled yet. Had EKG in hospital 09/2023 which was reportedly abnormal.  Echo 11/2023 with EF 45-50%, hypokinesis of apical/mid/basal anterior septum and basal/mid inferior septum, grade 2 diastolic dysfunction, mild MR and TR.  -Nuclear stress test ordered today for additional evaluation.   Newly diagnosed HFmrEF: Echo 11/2023 with EF 45-50%, hypokinesis of apical/mid/basal anterior septum and basal/mid inferior septum, grade 2 diastolic dysfunction, mild MR and TR.  -Start spironolactone 12.5 mg daily.   Abnormal EKG: EKG 09/2023 during hospitalization with sinus tachy, low voltage, no acute ischemic changes. EKG today sinus tachycardia rate 107 bpm with nonspecific T wave flattening, low voltage. Echo 11/2023 as above. -Continue to monitor for anginal symptoms.   Endometrial cancer, Blood loss anemia: Patient with recent issues of vaginal bleeding 2/2 endometrial cancer. S/p multiple transfusions, following with Oncology. -Continue following closely with oncology. Scheduled for infusion tomorrow.     Orders Placed This Encounter  Procedures  . NM myocardial perfusion SPECT multiple (stress and rest)  . CBC w/auto Differential (5 Part)  . ECG 12-lead    Return in about 2 weeks  (around 12/01/2023) for [facility].   Attestation Statement:   I personally performed the service, non-incident to. (WP)   CARALYN REBEKAH HUDSON, PA   Caralyn Pace, [place]

## 2023-11-11 ENCOUNTER — Other Ambulatory Visit

## 2023-11-11 ENCOUNTER — Inpatient Hospital Stay

## 2023-11-11 ENCOUNTER — Other Ambulatory Visit: Payer: Self-pay

## 2023-11-11 ENCOUNTER — Encounter: Payer: Self-pay | Admitting: Obstetrics and Gynecology

## 2023-11-11 ENCOUNTER — Inpatient Hospital Stay: Admitting: Obstetrics and Gynecology

## 2023-11-11 VITALS — BP 108/60 | HR 109 | Temp 98.6°F | Resp 20 | Wt 120.1 lb

## 2023-11-11 DIAGNOSIS — C541 Malignant neoplasm of endometrium: Secondary | ICD-10-CM | POA: Diagnosis not present

## 2023-11-11 DIAGNOSIS — D649 Anemia, unspecified: Secondary | ICD-10-CM

## 2023-11-11 DIAGNOSIS — Z7189 Other specified counseling: Secondary | ICD-10-CM

## 2023-11-11 LAB — CMP (CANCER CENTER ONLY)
ALT: 12 U/L (ref 0–44)
AST: 19 U/L (ref 15–41)
Albumin: 2.8 g/dL — ABNORMAL LOW (ref 3.5–5.0)
Alkaline Phosphatase: 86 U/L (ref 38–126)
Anion gap: 10 (ref 5–15)
BUN: 11 mg/dL (ref 8–23)
CO2: 25 mmol/L (ref 22–32)
Calcium: 8.9 mg/dL (ref 8.9–10.3)
Chloride: 99 mmol/L (ref 98–111)
Creatinine: 0.46 mg/dL (ref 0.44–1.00)
GFR, Estimated: >60 mL/min (ref >60)
Glucose, Bld: 108 mg/dL — ABNORMAL HIGH (ref 70–99)
Potassium: 3.6 mmol/L (ref 3.5–5.1)
Sodium: 134 mmol/L — ABNORMAL LOW (ref 135–145)
Total Bilirubin: 0.5 mg/dL (ref 0.0–1.2)
Total Protein: 7.8 g/dL (ref 6.5–8.1)

## 2023-11-11 LAB — FERRITIN: Ferritin: 49 ng/mL (ref 11–307)

## 2023-11-11 LAB — IRON AND TIBC
Iron: 12 ug/dL — ABNORMAL LOW (ref 28–170)
Saturation Ratios: 4 % — ABNORMAL LOW (ref 10.4–31.8)
TIBC: 286 ug/dL (ref 250–450)
UIBC: 274 ug/dL

## 2023-11-11 LAB — CBC WITH DIFFERENTIAL (CANCER CENTER ONLY)
Abs Immature Granulocytes: 0.06 K/uL (ref 0.00–0.07)
Basophils Absolute: 0.1 K/uL (ref 0.0–0.1)
Basophils Relative: 0 %
Eosinophils Absolute: 0.1 K/uL (ref 0.0–0.5)
Eosinophils Relative: 1 %
HCT: 32.2 % — ABNORMAL LOW (ref 36.0–46.0)
Hemoglobin: 10.3 g/dL — ABNORMAL LOW (ref 12.0–15.0)
Immature Granulocytes: 1 %
Lymphocytes Relative: 16 %
Lymphs Abs: 2.1 K/uL (ref 0.7–4.0)
MCH: 27 pg (ref 26.0–34.0)
MCHC: 32 g/dL (ref 30.0–36.0)
MCV: 84.3 fL (ref 80.0–100.0)
Monocytes Absolute: 0.7 K/uL (ref 0.1–1.0)
Monocytes Relative: 6 %
Neutro Abs: 9.8 K/uL — ABNORMAL HIGH (ref 1.7–7.7)
Neutrophils Relative %: 76 %
Platelet Count: 587 K/uL — ABNORMAL HIGH (ref 150–400)
RBC: 3.82 MIL/uL — ABNORMAL LOW (ref 3.87–5.11)
RDW: 15.7 % — ABNORMAL HIGH (ref 11.5–15.5)
WBC Count: 12.8 K/uL — ABNORMAL HIGH (ref 4.0–10.5)
nRBC: 0 % (ref 0.0–0.2)

## 2023-11-11 LAB — SAMPLE TO BLOOD BANK

## 2023-11-11 LAB — TSH: TSH: 3.27 u[IU]/mL (ref 0.350–4.500)

## 2023-11-11 MED ORDER — IRON SUCROSE 20 MG/ML IV SOLN
200.0000 mg | Freq: Once | INTRAVENOUS | Status: AC
  Administered 2023-11-11: 200 mg via INTRAVENOUS
  Filled 2023-11-11: qty 10

## 2023-11-11 NOTE — Progress Notes (Signed)
 Gynecologic Oncology Consult Visit   Referring Provider: Dr. Garnette Mace  Chief Concern: Endometrial cancer  Subjective:  Valerie Leonard is a 68 y.o. female who is seen in consultation from Dr. Mace for grade 2 endometrial cancer.   11/06/23- CT CHEST, ABDOMEN, AND PELVIS WITH CONTRAST   FINDINGS: CT CHEST FINDINGS - Cardiovascular: Normal caliber thoracic aorta. Normal size heart. No significant pericardial effusion/thickening. - Mediastinum/Nodes: No suspicious thyroid nodule. No pathologically enlarged mediastinal, hilar or axillary lymph nodes. The esophagus is grossly unremarkable. - Lungs/Pleura: Scattered pulmonary nodules.  For reference: -right upper lobe solid 3 mm pulmonary nodule on image 39/4. -irregular 5 mm right upper lobe pulmonary nodule on image 49/4. -subpleural 6 mm pulmonary nodule in the lingula on image 101/4. - Musculoskeletal: No aggressive lytic or blastic lesion of bone.   CT ABDOMEN PELVIS FINDINGS - Hepatobiliary:  -  Diffuse hepatic steatosis. Cholelithiasis without findings of acute cholecystitis. No biliary ductal dilation. - Pancreas: No pancreatic ductal dilation or evidence of acute inflammation. - Spleen: No splenomegaly. - Adrenals/Urinary Tract: No suspicious adrenal nodule/mass. No hydronephrosis. Kidneys demonstrate symmetric enhancement. Urinary bladder is unremarkable for degree of distension. - Stomach/Bowel: Stomach is distended with ingested material and gas without focal wall thickening. Colonic stool burden suggestive of constipation. No evidence of bowel obstruction. - Vascular/Lymphatic: Aortic atherosclerosis.   Enlarged bilateral iliac side chain and pelvic sidewall lymph nodes. - enlarged left common iliac lymph node measures 13 mm in short axis on image 106/2. - enlarged right external iliac lymph node measures 18 mm in short axis on image 105/2.   Reproductive:  - Heterogeneous irregular masslike expansion centered in the  lower uterine segment best seen on image 78/6 compatible with patient's known endometrial carcinoma.   Other:  - No significant abdominopelvic free fluid.   Musculoskeletal:  - No aggressive lytic or blastic lesion of bone. 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  - Multilevel degenerative changes spine.   IMPRESSION: 1. Heterogeneous irregular masslike expansion centered in the lower uterine segment compatible with patient's known endometrial carcinoma. 2. Enlarged bilateral iliac side chain and pelvic sidewall lymph nodes, compatible with nodal disease involvement. 3. Scattered pulmonary nodules measuring up to 6 mm, nonspecific but suspicious for metastatic disease. Consider attention on short-term interval follow-up chest CT. 4. Diffuse hepatic steatosis. 5. Cholelithiasis without findings of acute cholecystitis. 6. Colonic stool burden suggestive of constipation. 7.  Aortic Atherosclerosis    Gynecologic Oncology History:  She initially presented with postmenopausal vaginal bleeding. She experienced significant vaginal bleeding, severe enough to require emergency medical attention.    During her hospital stay, she underwent an ultrasound and received medication that successfully stopped the bleeding but caused xerostomia. She also required a blood transfusion due to a hemoglobin level of 5.8 g/dL, attributed to the significant blood loss. Hemoglobin on discharge: 8.4 (s/p 2 units pRBCs).   Pelvic ultrasound 09/27/2023:  FINDINGS:  Uterus  Measurements: 8.7 cm x 3.4 cm x 4.4 cm = volume: 68 mL. No fibroids or other mass visualized.   Endometrium  Thickness: 3.9 mm. A large amount of blood products are suspected throughout the endometrial canal.   Right ovary  Measurements: 2.1 cm x 1.2 cm x 1.4 cm = volume: 1.8 mL. Normal appearance/no adnexal mass.   Left ovary  Measurements: 2.1 cm x 1.4 cm x 1.3 cm = volume: 2.0 mL.  Normal appearance/no adnexal mass.   Other findings  No abnormal  free fluid.   IMPRESSION:  Large amount of blood products blood products suspected throughout the endometrial canal. Correlation with 6-12 week  follow-up pelvic ultrasound is recommended to further exclude the presence of an underlying endometrial lesion.     Endometrial Biopsy: MODERATELY DIFFERENTIATED ENDOMETRIOID ADENOCARCINOMA (FIGO II).   P16, patchy staining   ER, positive  wild-type staining for P53 Mismatch repair  MLH1-Loss of nuclear expression  MSH2-intact nuclear expression  MSH6-intact nuclear expression  PMS2-Loss of nuclear expression  Testing for methylation of MLH1 promoter will be requested  She has not seen a doctor in several years.  She does drink alcohol approximately 3 beers a day, may have early symptoms of dementia. She utilizes a walker and did have a fall down her front steps previously.  She is at a fall risk.    Her EKG in the ER was concerning for possible prior infarct.  She stated that she did have feelings of shortness of breath during that event but did not have definitive symptoms of a heart attack before.  Her troponin level was normal. She also has left knee pain and a few years ago had a x-ray of the knee that was consistent with arthritis.  Physical therapy was recommended.  She has not had subsequent follow-up.  She does not have a primary care physician.   We discussed alcohol use and in terms of the CAGE questions she answered the concern as affirmative.  But the other questions were all negative. She has never gone through withdrawal symptoms even when she has not been drinking alcohol for a prolonged period of time.   She has a very supportive family including a sister who came to the visit with her today and her brother.  Her sister Valerie Leonard attended during her visit today.   Problem List: Patient Active Problem List   Diagnosis Date Noted   Endometrial cancer (HCC)  11/11/2023   Anemia 11/04/2023   ABLA (acute blood loss anemia) 09/28/2023   Pressure injury of skin 09/28/2023   Vaginal bleeding 09/27/2023   Severe major depression, single episode, without psychotic features (HCC) 07/26/2020   Depression    Acute delirium 07/25/2020   Acute metabolic encephalopathy    Hypomagnesemia    Chronic alcohol abuse    Hypokalemia 07/24/2020    Past Medical History: Past Medical History:  Diagnosis Date   Acute delirium    Acute metabolic encephalopathy    Anemia    blood loss anemia   Chronic alcohol abuse    Depression    Endometrial cancer (HCC)    Hypokalemia    Hypomagnesemia    Pressure injury of skin    Severe major depression, single episode, without psychotic features (HCC)    Vaginal bleeding     Past Surgical History: No prior surgeries History reviewed. No pertinent surgical history.  Past Gynecologic History:  Last Menstrual Period: 53 History of Abnormal pap: no,  Last pap: unknown Sexually active: no  OB History:  OB History  No obstetric history on file.    Family History: History reviewed. No pertinent family history.  Social History: Social History   Socioeconomic History   Marital status: Single    Spouse name: Not on file   Number of children: Not on file   Years of education: Not on file   Highest education level: Not on file  Occupational History   Not on file  Tobacco Use   Smoking status: Former    Types:  Cigarettes, Cigars   Smokeless tobacco: Never  Vaping Use   Vaping status: Never Used  Substance and Sexual Activity   Alcohol use: Yes    Comment: More than 4 glasses of wine, mixed drinks, beer daily.    Drug use: Not Currently    Comment: former use of marijuana   Sexual activity: Not Currently    Birth control/protection: None  Other Topics Concern   Not on file  Social History Narrative   Not on file   Social Drivers of Health   Financial Resource Strain: Low Risk  (10/13/2023)    Received from [facility]   Overall Financial Resource Strain (CARDIA)    Difficulty of Paying Living Expenses: Not very hard  Food Insecurity: No Food Insecurity (10/13/2023)   Received from [facility]   Hunger Vital Sign    Within the past 12 months, you worried that your food would run out before you got the money to buy more.: Never true    Within the past 12 months, the food you bought just didn't last and you didn't have money to get more.: Never true  Recent Concern: Food Insecurity - Food Insecurity Present (09/27/2023)   Hunger Vital Sign    Worried About Running Out of Food in the Last Year: Sometimes true    Ran Out of Food in the Last Year: Sometimes true  Transportation Needs: No Transportation Needs (10/13/2023)   Received from [facility]   PRAPARE - Transportation    In the past 12 months, has lack of transportation kept you from medical appointments or from getting medications?: No    Lack of Transportation (Non-Medical): No  Recent Concern: Transportation Needs - Unmet Transportation Needs (09/27/2023)   PRAPARE - Administrator, Civil Service (Medical): Yes    Lack of Transportation (Non-Medical): Yes  Physical Activity: Not on file  Stress: Not on file  Social Connections: Socially Isolated (09/28/2023)   Social Connection and Isolation Panel    Frequency of Communication with Friends and Family: Never    Frequency of Social Gatherings with Friends and Family: Never    Attends Religious Services: Never    Database administrator or Organizations: No    Attends Banker Meetings: Never    Marital Status: Widowed  Intimate Partner Violence: Not At Risk (09/27/2023)   Humiliation, Afraid, Rape, and Kick questionnaire    Fear of Current or Ex-Partner: No    Emotionally Abused: No    Physically Abused: No    Sexually Abused: No    Allergies: No Known Allergies  Current Medications: Current  Outpatient Medications  Medication Sig Dispense Refill   citalopram  (CELEXA ) 20 MG tablet Take 1 tablet (20 mg total) by mouth daily. (Patient not taking: Reported on 11/04/2023) 60 tablet 1   folic acid  (FOLVITE ) 1 MG tablet Take 1 tablet (1 mg total) by mouth daily. (Patient not taking: Reported on 11/04/2023) 30 tablet 0   medroxyPROGESTERone  (PROVERA ) 10 MG tablet Take 1 tablet (10 mg total) by mouth 2 (two) times daily for 7 days. (Patient not taking: Reported on 11/04/2023) 14 tablet 0   Multiple Vitamin (MULTIVITAMIN WITH MINERALS) TABS tablet Take 1 tablet by mouth daily. 30 tablet 1   No current facility-administered medications for this visit.   Review of Systems General:  fatigue, weakness Skin: no complaints Eyes: no complaints HEENT: no complaints Breasts: no complaints Pulmonary: no complaints Cardiac: no complaints Gastrointestinal:  decreased appetite Genitourinary/Sexual: urinary issues Ob/Gyn: vaginal discharge, spotting, bleeding Musculoskeletal: no complaints Hematology: no complaints Neurologic/Psych: depression  CAGE: Positive concerns. Negative for AGE.   Objective:  Physical Examination:  BP 108/60   Pulse (!) 109   Temp 98.6 F (37 C)   Resp 20   Wt 120 lb 1.6 oz (54.5 kg)   SpO2 100%   BMI 22.69 kg/m    ECOG Performance Status: 2 - Symptomatic, <50% confined to bed  GENERAL: Elderly appearing female in no acute distress; concern for memory related issues HEENT:  PERRL. Thyroid without masses.  NODES:  No cervical, supraclavicular, axillary, or inguinal lymphadenopathy palpated.  LUNGS:  Clear to auscultation bilaterally.  No wheezes or rhonchi. HEART:  Regular rate and rhythm. ABDOMEN:  Soft, nontender.  Nondistended. No ascites. No masses. EXTREMITIES:  No peripheral edema.   SKIN:  Clear with no obvious rashes or skin changes.  NEURO:  Nonfocal. Well oriented.  Appropriate affect.  Pelvic: exam deferred   Lab Review Labs on site today: Lab  Results  Component Value Date   WBC 12.8 (H) 11/11/2023   HGB 10.3 (L) 11/11/2023   HCT 32.2 (L) 11/11/2023   MCV 84.3 11/11/2023   PLT 587 (H) 11/11/2023   Lab Results  Component Value Date   NA 134 (L) 11/11/2023   CL 99 11/11/2023   K 3.6 11/11/2023   CO2 25 11/11/2023   BUN 11 11/11/2023   CREATININE 0.46 11/11/2023   GFRNONAA >60 11/11/2023   CALCIUM  8.9 11/11/2023   PHOS 2.6 07/26/2020   ALBUMIN 2.8 (L) 11/11/2023   GLUCOSE 108 (H) 11/11/2023   Lab Results  Component Value Date   ALT 12 11/11/2023   AST 19 11/11/2023   ALKPHOS 86 11/11/2023   BILITOT 0.5 11/11/2023   Iron /TIBC/Ferritin/ %Sat Lab Results  Component Value Date   IRON  12 (L) 11/11/2023   TIBC 286 11/11/2023   FERRITIN 49 11/11/2023      HIV nonreactive Troponin normal      Component Ref Range & Units (hover) 1 mo ago 3 yr ago  Prothrombin Time 16.0 High  16.4 High   INR 1.3 High  1.3 High  CM  Comment: (NOTE)     EKG 09/28/2023 8:59:50 AM Sinus tachycardia Borderline low voltage, extremity leads Probable anteroseptal infarct, old Prolonged QT interval   Radiologic Imaging: as per HPI 09/28/2023 PORTABLE CHEST 1 VIEW COMPARISON:  09/27/2023 FINDINGS: Stable cardiomediastinal silhouette. No focal consolidation, pleural effusion, or pneumothorax. No displaced rib fractures.   IMPRESSION: No active disease.     Assessment:  SHARUNDA SALMON is a 68 y.o. female diagnosed with grade 2 endometrial cancer, dMMR, p53wt, ER+ with radiologic imaging concerning for stage III C1 disease and findings of pulmonary nodules are not definitive.  Anemia s/p blood transfusion x 2 secondary to vaginal bleeding from endometrial cancer. Dizziness and orthostatic hypotension possibly due to persistent anemia. Received additional 1 unit of blood. Hmg 10.3 today.   Hypoalbuminemia  Possible hypocalcemia  Elevated PT  Alcohol use  Possible prior MI based on EKG  Leukocytosis uncertain  etiology   Medical co-morbidities complicating care: As Above Plan:   Problem List Items Addressed This Visit       Genitourinary   Endometrial cancer [facility]) - Primary   Relevant Orders   Ambulatory referral to Hematology / Oncology   Other Visit Diagnoses       Counseling and coordination of care  We discussed options for management including chemotherapy and immunotherapy. In the future, we can discuss the role of radiation, and surgery.  We are still waiting for the ECHO results and for her promoter methylation status.  If the promoter is not methylated then plan for genetics testing.  Consult with medical oncology to set discuss systemic chemotherapy with paclitaxel, carboplatin and immunotherapy  The patient's diagnosis, an outline of the further diagnostic and laboratory studies which will be required, the recommendation, and alternatives were discussed.  All questions were answered to the patient's satisfaction.  Tinnie Dawn, DNP, AGNP-C, AOCNP [facility] [phone] (clinic)  I personally had a face to face interaction and evaluated the patient jointly with the NP, Ms. Tinnie Dawn.  I have reviewed her history and available records and met with the patient to provide counseling.  I have discussed the case with the APP and the patient.  I agree with the above documentation, assessment and plan which was fully formulated by me.  Counseling was completed by me.   I personally saw the patient and performed a substantive portion of this encounter in conjunction with the listed APP as documented above.  Valerie Mukherjee Isidor Constable, MD   CC:  Garnette JONETTA Mace,  MD [address] [place],  [place] [address] [phone]

## 2023-11-11 NOTE — Patient Instructions (Signed)

## 2023-11-13 ENCOUNTER — Encounter

## 2023-11-18 ENCOUNTER — Telehealth: Payer: Self-pay | Admitting: Oncology

## 2023-11-18 ENCOUNTER — Inpatient Hospital Stay

## 2023-11-18 ENCOUNTER — Inpatient Hospital Stay: Admitting: Oncology

## 2023-11-18 NOTE — Telephone Encounter (Signed)
 Pt sister called and stated pt is not feeling well, needs to r/s appts from today. Appts are r/s to 8/27. Pt has a stress test on 8/26.

## 2023-11-19 ENCOUNTER — Encounter: Payer: Self-pay | Admitting: Hospice and Palliative Medicine

## 2023-11-19 NOTE — Progress Notes (Signed)
 The patient enrolled in services on 11/11/2023 and initial behavioral health evaluation is scheduled for 11/23/2023.

## 2023-11-22 ENCOUNTER — Other Ambulatory Visit: Payer: Self-pay

## 2023-11-22 ENCOUNTER — Emergency Department

## 2023-11-22 ENCOUNTER — Emergency Department
Admission: EM | Admit: 2023-11-22 | Discharge: 2023-11-22 | Disposition: A | Discharge status: Home / Self Care | Attending: Emergency Medicine | Admitting: Emergency Medicine

## 2023-11-22 ENCOUNTER — Encounter: Payer: Self-pay | Admitting: Emergency Medicine

## 2023-11-22 DIAGNOSIS — F419 Anxiety disorder, unspecified: Secondary | ICD-10-CM | POA: Insufficient documentation

## 2023-11-22 DIAGNOSIS — I959 Hypotension, unspecified: Secondary | ICD-10-CM | POA: Diagnosis not present

## 2023-11-22 DIAGNOSIS — R531 Weakness: Secondary | ICD-10-CM | POA: Diagnosis present

## 2023-11-22 DIAGNOSIS — E876 Hypokalemia: Secondary | ICD-10-CM

## 2023-11-22 DIAGNOSIS — N3001 Acute cystitis with hematuria: Secondary | ICD-10-CM

## 2023-11-22 LAB — URINALYSIS, ROUTINE W REFLEX MICROSCOPIC
Bilirubin Urine: NEGATIVE
Glucose, UA: NEGATIVE mg/dL
Ketones, ur: NEGATIVE mg/dL
Nitrite: NEGATIVE
Protein, ur: 100 mg/dL — AB
RBC / HPF: >50 RBC/hpf (ref 0–5)
Specific Gravity, Urine: 1.008 (ref 1.005–1.030)
WBC, UA: >50 WBC/hpf (ref 0–5)
pH: 6 (ref 5.0–8.0)

## 2023-11-22 LAB — COMPREHENSIVE METABOLIC PANEL WITH GFR
ALT: 24 U/L (ref 0–44)
AST: 24 U/L (ref 15–41)
Albumin: 2.8 g/dL — ABNORMAL LOW (ref 3.5–5.0)
Alkaline Phosphatase: 76 U/L (ref 38–126)
Anion gap: 14 (ref 5–15)
BUN: 6 mg/dL — ABNORMAL LOW (ref 8–23)
CO2: 22 mmol/L (ref 22–32)
Calcium: 8.8 mg/dL — ABNORMAL LOW (ref 8.9–10.3)
Chloride: 99 mmol/L (ref 98–111)
Creatinine, Ser: 0.63 mg/dL (ref 0.44–1.00)
GFR, Estimated: >60 mL/min (ref >60)
Glucose, Bld: 92 mg/dL (ref 70–99)
Potassium: 3.1 mmol/L — ABNORMAL LOW (ref 3.5–5.1)
Sodium: 135 mmol/L (ref 135–145)
Total Bilirubin: 0.5 mg/dL (ref 0.0–1.2)
Total Protein: 7.9 g/dL (ref 6.5–8.1)

## 2023-11-22 LAB — CBC
HCT: 31.8 % — ABNORMAL LOW (ref 36.0–46.0)
Hemoglobin: 10 g/dL — ABNORMAL LOW (ref 12.0–15.0)
MCH: 26.3 pg (ref 26.0–34.0)
MCHC: 31.4 g/dL (ref 30.0–36.0)
MCV: 83.7 fL (ref 80.0–100.0)
Platelets: 607 K/uL — ABNORMAL HIGH (ref 150–400)
RBC: 3.8 MIL/uL — ABNORMAL LOW (ref 3.87–5.11)
RDW: 16.1 % — ABNORMAL HIGH (ref 11.5–15.5)
WBC: 11.6 K/uL — ABNORMAL HIGH (ref 4.0–10.5)
nRBC: 0 % (ref 0.0–0.2)

## 2023-11-22 LAB — MAGNESIUM: Magnesium: 2.1 mg/dL (ref 1.7–2.4)

## 2023-11-22 LAB — LACTIC ACID, PLASMA
Lactic Acid, Venous: 2.5 mmol/L (ref 0.5–1.9)
Lactic Acid, Venous: 2.5 mmol/L (ref 0.5–1.9)

## 2023-11-22 LAB — LIPASE, BLOOD: Lipase: 26 U/L (ref 11–51)

## 2023-11-22 MED ORDER — LACTATED RINGERS IV BOLUS
500.0000 mL | Freq: Once | INTRAVENOUS | Status: AC
  Administered 2023-11-22: 500 mL via INTRAVENOUS

## 2023-11-22 MED ORDER — SODIUM CHLORIDE 0.9 % IV SOLN
1.0000 g | Freq: Once | INTRAVENOUS | Status: AC
  Administered 2023-11-22: 1 g via INTRAVENOUS
  Filled 2023-11-22: qty 10

## 2023-11-22 MED ORDER — POTASSIUM CHLORIDE CRYS ER 20 MEQ PO TBCR
40.0000 meq | EXTENDED_RELEASE_TABLET | Freq: Once | ORAL | Status: AC
  Administered 2023-11-22: 40 meq via ORAL
  Filled 2023-11-22: qty 2

## 2023-11-22 MED ORDER — CEFDINIR 300 MG PO CAPS: 300.0000 mg | ORAL_CAPSULE | Freq: Two times a day (BID) | ORAL | 0 refills | Status: AC

## 2023-11-22 NOTE — ED Triage Notes (Signed)
 Pt from home via [facility] with reports of something just isn't right, you know I live alone and I was just diagnosed with cancer. Pt reports she is SHOB while lying on her side.

## 2023-11-22 NOTE — ED Provider Notes (Signed)
 [facility] Provider Note    Event Date/Time   First MD Initiated Contact with Patient 11/22/23 1451     (approximate)   History   Feeling weak and tired   HPI  Valerie Leonard is a 68 y.o. female with recent diagnosis of endometrial cancer.  The patient restimulate she just received a diagnosis recently, she has not started any treatment but there was discussion of chemo and radiation with her oncologist plan for the future  Patient reports for 1 week now she has been feeling fatigued.  She reports it is giving her an anxious feeling at times and she has been feeling very tired and weak.  No pain.  No shortness of breath, no chest pain no nausea or vomiting.  No abdominal pain.  Denies any ongoing vaginal bleeding.  Denies any fever no cough no shortness of breath.  Reports she just feels tired but otherwise cannot explain it well.  The patient does report she has had poor appetite as of recently but no pain.  No vomiting.  No stomach pain just does not eat much does not drink much, feeling fatigued for about a week now.   Patient reports that she had been admitted to the hospital recently due to heavy vaginal bleeding.  That got treated and she has not had any further bleeding.  Denies any vomiting no dark or bloody stool no further vaginal bleeding.  Physical Exam   Triage Vital Signs: ED Triage Vitals  Encounter Vitals Group     BP 11/22/23 1433 (!) 81/45     Girls Systolic BP Percentile --      Girls Diastolic BP Percentile --      Boys Systolic BP Percentile --      Boys Diastolic BP Percentile --      Pulse Rate 11/22/23 1433 (!) 112     Resp 11/22/23 1433 18     Temp 11/22/23 1433 98.5 F (36.9 C)     Temp Source 11/22/23 1433 Oral     SpO2 11/22/23 1433 99 %     Weight 11/22/23 1433 120 lb (54.4 kg)     Height --      Head Circumference --      Peak Flow --      Pain Score 11/22/23 1432 0     Pain Loc --      Pain Education --       Exclude from Growth Chart --     Most recent vital signs: Vitals:   11/22/23 1500 11/22/23 1539  BP: (!) 91/58 107/72  Pulse: 98 96  Resp: (!) 21 16  Temp:    SpO2: 100% 100%     General: Awake, no distress.  She is pleasant.  In no distress.  She is calm but relates a feeling of anxiety being in a hospital.  She is notably hypotensive with blood pressure 80 systolic on arrival.  This improved to mid 90s systolic not long after arrival CV:  Good peripheral perfusion.  Normal tones and rate is borderline tachycardic Resp:  Normal effort.  Clear bilateral Abd:  No distention.  Soft nontender nondistended.  External examination of the vaginal introitus does not show any active bleeding via visual inspection alone. Other:  Warm well-perfused.  Hypotension but normal skin color, does not appear to have acute pallor, capillary refill is normal.   ED Results / Procedures / Treatments   Labs (all labs ordered are listed, but  only abnormal results are displayed) Labs Reviewed  COMPREHENSIVE METABOLIC PANEL WITH GFR - Abnormal; Notable for the following components:      Result Value   Potassium 3.1 (*)    BUN 6 (*)    Calcium  8.8 (*)    Albumin 2.8 (*)    All other components within normal limits  CBC - Abnormal; Notable for the following components:   WBC 11.6 (*)    RBC 3.80 (*)    Hemoglobin 10.0 (*)    HCT 31.8 (*)    RDW 16.1 (*)    Platelets 607 (*)    All other components within normal limits  LACTIC ACID, PLASMA - Abnormal; Notable for the following components:   Lactic Acid, Venous 2.5 (*)    All other components within normal limits  LIPASE, BLOOD  MAGNESIUM   URINALYSIS, ROUTINE W REFLEX MICROSCOPIC   Labs at this juncture include CBC with very slight leukocytosis.  Hemoglobin of 10.  Platelet count 670.  Mild anemia which demonstrates stability from previous check about 11 days ago  EKG  Interpreted by me at 1440 heart rate 110 QRS 70 QTc 460 Sinus tachycardia.   No evidence of acute ischemia   RADIOLOGY Chest x-ray inter by me is clear    No shortness of breath, no pleuritic pain no hypoxia.  Chest x-ray clear.  No noted clinical findings would be highly suggestive of thromboembolism.  No associated abdominal pain at this time or acute gastrointestinal symptoms noted.    PROCEDURES:  Critical Care performed: Yes, see critical care procedure note(s)  CRITICAL CARE Performed by: Oneil Budge   Total critical care time: 25 minutes  Critical care time was exclusive of separately billable procedures and treating other patients.  Critical care was necessary to treat or prevent imminent or life-threatening deterioration.  Critical care was time spent personally by me on the following activities: development of treatment plan with patient and/or surrogate as well as nursing, discussions with consultants, evaluation of patient's response to treatment, examination of patient, obtaining history from patient or surrogate, ordering and performing treatments and interventions, ordering and review of laboratory studies, ordering and review of radiographic studies, pulse oximetry and re-evaluation of patient's condition.  Includes acute severe hypotension.  Procedures   MEDICATIONS ORDERED IN ED: Medications  lactated ringers  bolus 500 mL (500 mLs Intravenous New Bag/Given 11/22/23 1506)  lactated ringers  bolus 500 mL (500 mLs Intravenous New Bag/Given 11/22/23 1535)     IMPRESSION / MDM / ASSESSMENT AND PLAN / ED COURSE  I reviewed the triage vital signs and the nursing notes.                              Differential diagnosis includes, but is not limited to, possible recurrent anemia, dehydration electrolyte abnormality, infection sepsis, hypothyroidism etc.  Patient relates no pain symptoms for about 1 week now of fatigue notably hypotensive on arrival.  Current diagnosis of active cancer but denies any acute bleeding and no bleeding is  obviously apparent by exam.  Hemoglobin appropriate.  Denies any pain or burning with urination denies any cough.  Chest x-ray appears clear.  No associated chest pain or hypoxia, arguing against cause such as thromboembolism or acute embolic cause.  Further workup however is pending.  No neurologic abnormalities.  Awake and alert.  Awaiting further workup  Patient's presentation is most consistent with acute presentation with potential threat to life or bodily  function.   The patient is on the cardiac monitor to evaluate for evidence of arrhythmia and/or significant heart rate changes.  No acute chest pain.  No acute cardiac symptoms.  ECG without evidence of acute ischemia   Clinical Course as of 11/22/23 1611  Sun Nov 22, 2023  1540 H/o of new endometrial cancer, depression, presents with 1 week of general weakness. Not on chemo or radiation. Notified that BP cuff not working well and first recordings may not have been accurate.  [HD]    Clinical Course User Index [HD] Nicholaus Rolland BRAVO, MD   ----------------------------------------- 3:07 PM on 11/22/2023 ----------------------------------------- Patient reports she has not had any further vaginal bleeding.  She did allow inspection externally, escorted by nurse Jenna and nurse Lang.  No active vaginal bleeding to inspection at this time.  Patient reports last time she was admitted she had significant bleeding but that has not occurred recently.  Blood pressure 98 systolic at this time fully awake alert oriented   ----------------------------------------- 4:10 PM on 11/22/2023 ----------------------------------------- Ongoing care signed to oncoming provider.  Patient completing fluid bolus.  Hypotension improving.  Moderately increased lactic acidosis and mild hypokalemia noted to this point.  Awaiting urinalysis will require careful reassessment prior to disposition decision.  FINAL CLINICAL IMPRESSION(S) / ED DIAGNOSES   Final  diagnoses:  Hypotension, unspecified hypotension type  Generalized weakness     Rx / DC Orders   ED Discharge Orders     None        Note:  This document was prepared using Dragon voice recognition software and may include unintentional dictation errors.   Dicky Anes, MD 11/22/23 [phone]

## 2023-11-22 NOTE — ED Notes (Signed)
 Patient ringing call bell stating she does not want to be admitted, that she will not get well in the hospital, that she just fund out she has cancer and she refuses to stay and does not understand why no one has told her anything regarding treatment. Pt reassured that multiple staff and MD have been in to care for her, that results do take time to come through and no one has mentioned admission to her at this time. Pt very anxious and patient somewhat resistant to being in the ER. Reassurance offered.

## 2023-11-22 NOTE — ED Provider Notes (Signed)
 Clinical Course as of 11/22/23 1932  Sun Nov 22, 2023  1540 H/o of new endometrial cancer, depression, presents with 1 week of general weakness. Not on chemo or radiation. Notified that BP cuff not working well and first recordings may not have been accurate.  [HD]  1648 DG Chest Portable 1 View Unremarkable [HD]  1730 I talked to the patient's sister [name].  Patient does have early dementia and can be easily confused and patient has been at her baseline.  If workup is unremarkable I will feel comfortable with the patient returning home today [HD]  1912 Patient's brother at bedside.  He feels comfortable with going home.  States that she is at her baseline mental state.  I will work on discharge. [HD]  1914 Urinalysis, Routine w reflex microscopic -Urine, Clean Catch(!) This is consistent with a UTI [HD]    Clinical Course User Index [HD] Nicholaus Rolland BRAVO, MD   Patient ultimately was diagnosed with a UTI.  She received a dose of ceftriaxone  [place] and have prescribed 1 week of cefdinir  to her pharmacy of choice.  I will send the urine for culture.  Patient and family member feel comfortable with discharge at this time  At time of discharge there is no evidence of acute life, limb, vision, or fertility threat. Patient has stable vital signs, pain is well controlled, patient is ambulatory and p.o. tolerant.  Discharge instructions were completed using the Epic system. I would refer you to those at this time. All warnings prescriptions follow-up etc. were discussed in detail with the patient. Patient indicates understanding and is agreeable with this plan. All questions answered.  Patient is made aware that they may return to the emergency department for any worsening or new condition or for any other emergency.    Nicholaus Rolland BRAVO, MD 11/22/23 [phone]

## 2023-11-22 NOTE — Discharge Instructions (Addendum)
 You were seen in the emergency department for a feeling of general malaise.  You were dehydrated but your workup was otherwise negative.  Your potassium was low and this was repleted.  Please increase your potassium content of foods..  Follow-up with your primary care physician as soon as possible.  Return with any acutely worsening symptoms or any other emergency.  It was very nice meeting you and I wish you the best of luck with everything! -- RETURN PRECAUTIONS & AFTERCARE: (ENGLISH) RETURN PRECAUTIONS: Return immediately to the emergency department or see/call your doctor if you feel worse, weak or have changes in speech or vision, are short of breath, have fever, vomiting, pain, bleeding or dark stool, trouble urinating or any new issues. Return here or see/call your doctor if not improving as expected for your suspected condition. FOLLOW-UP CARE: Call your doctor and/or any doctors we referred you to for more advice and to make an appointment. Do this today, tomorrow or after the weekend. Some doctors only take PPO insurance so if you have HMO insurance you may want to contact your HMO or your regular doctor for referral to a specialist within your plan. Either way tell the doctor's office that it was a referral from the emergency department so you get the soonest possible appointment.  YOUR TEST RESULTS: Take result reports of any blood or urine tests, imaging tests and EKG's to your doctor and any referral doctor. Have any abnormal tests repeated. Your doctor or a referral doctor can let you know when this should be done. Also make sure your doctor contacts this hospital to get any test results that are not currently available such as cultures or special tests for infection and final imaging reports, which are often not available at the time you leave the ER but which may list additional important findings that are not documented on the preliminary report. BLOOD PRESSURE: If your blood pressure was  greater than 120/80 have your blood pressure rechecked within 1 to 2 weeks. MEDICATION SIDE EFFECTS: Do not drive, walk, bike, take the bus, etc. if you have received or are being prescribed any sedating medications such as those for pain or anxiety or certain antihistamines like Benadryl . If you have been give one of these here get a taxi home or have a friend drive you home. Ask your pharmacist to counsel you on potential side effects of any new medication

## 2023-11-22 NOTE — ED Notes (Signed)
 ED Provider at bedside.

## 2023-11-22 NOTE — ED Provider Notes (Signed)
 Emergency Medicine Provider Triage Evaluation Note  Valerie Leonard , a 68 y.o. female  was evaluated in triage.  Pt complains of feeling weak for several days, reports a general feeling of just feeling tired.  Denies being in any pain.  Reports she has a recent diagnosis of cancer.  Review of Systems  Positive: Feeling fatigued.  Discussed with patient her blood pressure she reports that she always has low blood pressure  Negative: Denies chest pain.  No shortness of breath.  No pain anywhere.  Physical Exam  BP (!) 81/45 (BP Location: Right Arm)   Pulse (!) 112   Temp 98.5 F (36.9 C) (Oral)   Resp 18   Wt 54.4 kg   SpO2 99%   BMI 22.67 kg/m  Gen:   Awake, no distress   Resp:  Normal effort  MSK:   Moves extremities without difficulty  Other:  Pleasant appears in no acute distress.  Hypotension notable  Medical Decision Making  Medically screening exam initiated at 2:48 PM.  Appropriate orders placed.  ANGALENA COUSINEAU was informed that the remainder of the evaluation will be completed by another provider, this initial triage assessment does not replace that evaluation, and the importance of remaining in the ED until their evaluation is complete.  EKG interpreted by me as sinus tachycardia, artifact present.  No frank evidence of ischemia 2 noted.   Dicky Anes, MD 11/22/23 1450

## 2023-11-22 NOTE — ED Notes (Signed)
 Patient up and to toilet in room with x1 max assist r/t hx of walker use. Specimen obtained and patient back in bed without incident. Clean brief placed r/t saturated brief. Chucks placed.

## 2023-11-22 NOTE — ED Notes (Signed)
 Lab called by this nurse r/t UA results not available.

## 2023-11-24 LAB — URINE CULTURE

## 2023-12-02 ENCOUNTER — Other Ambulatory Visit

## 2023-12-02 ENCOUNTER — Encounter: Payer: Self-pay | Admitting: Oncology

## 2023-12-02 ENCOUNTER — Inpatient Hospital Stay (HOSPITAL_BASED_OUTPATIENT_CLINIC_OR_DEPARTMENT_OTHER): Admitting: Oncology

## 2023-12-02 ENCOUNTER — Inpatient Hospital Stay

## 2023-12-02 VITALS — BP 129/102 | HR 113 | Temp 98.2°F | Resp 19 | Ht 61.0 in | Wt 108.2 lb

## 2023-12-02 VITALS — BP 91/59 | HR 117 | Resp 18

## 2023-12-02 DIAGNOSIS — D500 Iron deficiency anemia secondary to blood loss (chronic): Secondary | ICD-10-CM | POA: Diagnosis not present

## 2023-12-02 DIAGNOSIS — C541 Malignant neoplasm of endometrium: Secondary | ICD-10-CM | POA: Diagnosis not present

## 2023-12-02 DIAGNOSIS — D649 Anemia, unspecified: Secondary | ICD-10-CM

## 2023-12-02 DIAGNOSIS — Z7189 Other specified counseling: Secondary | ICD-10-CM

## 2023-12-02 MED ORDER — LIDOCAINE-PRILOCAINE 2.5-2.5 % EX CREA: TOPICAL_CREAM | CUTANEOUS | 3 refills | Status: AC

## 2023-12-02 MED ORDER — PROCHLORPERAZINE MALEATE 10 MG PO TABS: 10.0000 mg | ORAL_TABLET | Freq: Four times a day (QID) | ORAL | 1 refills | Status: AC | PRN

## 2023-12-02 MED ORDER — ONDANSETRON HCL 8 MG PO TABS: 8.0000 mg | ORAL_TABLET | Freq: Three times a day (TID) | ORAL | 1 refills | Status: AC | PRN

## 2023-12-02 MED ORDER — DEXAMETHASONE 4 MG PO TABS: ORAL_TABLET | ORAL | 1 refills | Status: AC

## 2023-12-02 MED ORDER — IRON SUCROSE 20 MG/ML IV SOLN
200.0000 mg | Freq: Once | INTRAVENOUS | Status: AC
  Administered 2023-12-02: 200 mg via INTRAVENOUS
  Filled 2023-12-02: qty 10

## 2023-12-02 NOTE — Progress Notes (Signed)
 Hematology/Oncology Consult note [facility] [phone] [phone]  Patient Care Team: Pcp, No as PCP - General Maurie Rayfield BIRCH, RN as Oncology Nurse Navigator   Name of the patient: Valerie Leonard  [id number]  05-07-1955    Reason for referral-new diagnosis of endometrial carcinoma   Referring physician-Dr. Elby  Date of visit: 12/02/23   History of presenting illness- Patient is a 68 year old female who presented with post menopausal bleeding in June 2025.  Her hemoglobin had dropped down to 5.8 requiring PRBC transfusion.  Pelvic ultrasound in June 2025 showed large amount of blood products throughout the endometrial panel.  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  Wild-type staining for p53.  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  Enlarged bilateral iliac sided chain and pelvic sidewall lymph nodes compatible with nodal disease involvement.  Scattered pulmonary nodules measuring up to 6 mm nonspecific but suspicious for metastatic disease.  Patient is here with her sister today.  She lives alone and is independent of her ADLs.  Her neighbor checks on her now and then and her sister lives about 30 minutes away.  Patient reports no vaginal bleeding presently.  She has ongoing fatigue  ECOG PS- 2  Pain scale- 0   Review of systems- Review of Systems  Constitutional:  Positive for malaise/fatigue. Negative for chills, fever and weight loss.  HENT:  Negative for congestion, ear discharge and nosebleeds.   Eyes:  Negative for blurred  vision.  Respiratory:  Negative for cough, hemoptysis, sputum production, shortness of breath and wheezing.   Cardiovascular:  Negative for chest pain, palpitations, orthopnea and claudication.  Gastrointestinal:  Negative for abdominal pain, blood in stool, constipation, diarrhea, heartburn, melena, nausea and vomiting.  Genitourinary:  Negative for dysuria, flank pain, frequency, hematuria and urgency.  Musculoskeletal:  Negative for back pain, joint pain and myalgias.  Skin:  Negative for rash.  Neurological:  Negative for dizziness, tingling, focal weakness, seizures, weakness and headaches.  Endo/Heme/Allergies:  Does not bruise/bleed easily.  Psychiatric/Behavioral:  Negative for depression and suicidal ideas. The patient does not have insomnia.     No Known Allergies  Patient Active Problem List   Diagnosis Date Noted   Endometrial cancer (HCC) 11/11/2023   Anemia 11/04/2023   ABLA (acute blood loss anemia) 09/28/2023   Pressure injury of skin 09/28/2023   Vaginal bleeding 09/27/2023   Severe major depression, single episode, without psychotic features (HCC) 07/26/2020   Depression    Acute delirium 07/25/2020   Acute metabolic encephalopathy    Hypomagnesemia    Chronic alcohol abuse    Hypokalemia 07/24/2020     Past Medical History:  Diagnosis Date   Acute delirium    Acute metabolic encephalopathy    Anemia    blood loss anemia   Chronic alcohol abuse    Depression    Endometrial cancer (HCC)    Hypokalemia    Hypomagnesemia    Pressure injury of skin    Severe major depression, single episode, without psychotic features (HCC)    Vaginal bleeding      No past surgical history on file.  Social History   Socioeconomic History  Marital status: Single    Spouse name: Not on file   Number of children: Not on file   Years of education: Not on file   Highest education level: Not on file  Occupational History   Not on file  Tobacco Use   Smoking  status: Former    Types: Cigarettes, Cigars   Smokeless tobacco: Never  Vaping Use   Vaping status: Never Used  Substance and Sexual Activity   Alcohol use: Yes    Comment: More than 4 glasses of wine, mixed drinks, beer daily.    Drug use: Not Currently    Comment: former use of marijuana   Sexual activity: Not Currently    Birth control/protection: None  Other Topics Concern   Not on file  Social History Narrative   Not on file   Social Drivers of Health   Financial Resource Strain: Low Risk  (10/13/2023)   Received from [facility]   Overall Financial Resource Strain (CARDIA)    Difficulty of Paying Living Expenses: Not very hard  Food Insecurity: No Food Insecurity (10/13/2023)   Received from [facility]   Hunger Vital Sign    Within the past 12 months, you worried that your food would run out before you got the money to buy more.: Never true    Within the past 12 months, the food you bought just didn't last and you didn't have money to get more.: Never true  Recent Concern: Food Insecurity - Food Insecurity Present (09/27/2023)   Hunger Vital Sign    Worried About Running Out of Food in the Last Year: Sometimes true    Ran Out of Food in the Last Year: Sometimes true  Transportation Needs: No Transportation Needs (10/13/2023)   Received from [facility]   PRAPARE - Transportation    In the past 12 months, has lack of transportation kept you from medical appointments or from getting medications?: No    Lack of Transportation (Non-Medical): No  Recent Concern: Transportation Needs - Unmet Transportation Needs (09/27/2023)   PRAPARE - Administrator, Civil Service (Medical): Yes    Lack of Transportation (Non-Medical): Yes  Physical Activity: Not on file  Stress: Not on file  Social Connections: Socially Isolated (09/28/2023)   Social Connection and Isolation Panel    Frequency of Communication with Friends and  Family: Never    Frequency of Social Gatherings with Friends and Family: Never    Attends Religious Services: Never    Database administrator or Organizations: No    Attends Banker Meetings: Never    Marital Status: Widowed  Intimate Partner Violence: Not At Risk (09/27/2023)   Humiliation, Afraid, Rape, and Kick questionnaire    Fear of Current or Ex-Partner: No    Emotionally Abused: No    Physically Abused: No    Sexually Abused: No     No family history on file.   Current Outpatient Medications:    citalopram  (CELEXA ) 20 MG tablet, Take 1 tablet (20 mg total) by mouth daily. (Patient not taking: Reported on 11/04/2023), Disp: 60 tablet, Rfl: 1   folic acid  (FOLVITE ) 1 MG tablet, Take 1 tablet (1 mg total) by mouth daily. (Patient not taking: Reported on 11/04/2023), Disp: 30 tablet, Rfl: 0   medroxyPROGESTERone  (PROVERA ) 10 MG tablet, Take 1 tablet (10 mg total) by mouth 2 (two) times daily for 7 days. (Patient not taking: Reported on 11/04/2023), Disp: 14  tablet, Rfl: 0   Multiple Vitamin (MULTIVITAMIN WITH MINERALS) TABS tablet, Take 1 tablet by mouth daily., Disp: 30 tablet, Rfl: 1   Physical exam: There were no vitals filed for this visit. Physical Exam Constitutional:      Comments: She is thin and somewhat frail appearing.  Sitting in a wheelchair.  Appears in no acute distress  Cardiovascular:     Rate and Rhythm: Normal rate and regular rhythm.     Heart sounds: Normal heart sounds.  Pulmonary:     Effort: Pulmonary effort is normal.     Breath sounds: Normal breath sounds.  Abdominal:     General: Bowel sounds are normal.     Palpations: Abdomen is soft.  Skin:    General: Skin is warm and dry.  Neurological:     Mental Status: She is alert and oriented to person, place, and time.           Latest Ref Rng & Units 11/22/2023    3:00 PM  CMP  Glucose 70 - 99 mg/dL 92   BUN 8 - 23 mg/dL 6   Creatinine 9.55 - 8.99 mg/dL 9.36   Sodium 864 - 854  mmol/L 135   Potassium 3.5 - 5.1 mmol/L 3.1   Chloride 98 - 111 mmol/L 99   CO2 22 - 32 mmol/L 22   Calcium  8.9 - 10.3 mg/dL 8.8   Total Protein 6.5 - 8.1 g/dL 7.9   Total Bilirubin 0.0 - 1.2 mg/dL 0.5   Alkaline Phos 38 - 126 U/L 76   AST 15 - 41 U/L 24   ALT 0 - 44 U/L 24       Latest Ref Rng & Units 11/22/2023    3:00 PM  CBC  WBC 4.0 - 10.5 K/uL 11.6   Hemoglobin 12.0 - 15.0 g/dL 89.9   Hematocrit 63.9 - 46.0 % 31.8   Platelets 150 - 400 K/uL 607     No images are attached to the encounter.  DG Chest Portable 1 View Result Date: 11/22/2023 CLINICAL DATA:  Fatigue, hypotension EXAM: PORTABLE CHEST 1 VIEW COMPARISON:  09/28/2023 FINDINGS: Single frontal view of the chest demonstrates an unremarkable cardiac silhouette. No airspace disease, effusion, or pneumothorax. No acute bony abnormalities. IMPRESSION: 1. No acute intrathoracic process. Electronically Signed   By: Ozell Daring M.D.   On: 11/22/2023 15:44   CT CHEST ABDOMEN PELVIS W CONTRAST Result Date: 11/10/2023 CLINICAL DATA:  Endometrial cancer staging, low-grade tumor assessment. * Tracking Code: BO * EXAM: CT CHEST, ABDOMEN, AND PELVIS WITH CONTRAST TECHNIQUE: Multidetector CT imaging of the chest, abdomen and pelvis was performed following the standard protocol during bolus administration of intravenous contrast. RADIATION DOSE REDUCTION: This exam was performed according to the departmental dose-optimization program which includes automated exposure control, adjustment of the mA and/or kV according to patient size and/or use of iterative reconstruction technique. CONTRAST:  OMNIPAQUE  IOHEXOL  300 MG/ML  SOLN COMPARISON:  CT July 24, 2020 FINDINGS: CT CHEST FINDINGS Cardiovascular: Normal caliber thoracic aorta. Normal size heart. No significant pericardial effusion/thickening. Mediastinum/Nodes: No suspicious thyroid  nodule. No pathologically enlarged mediastinal, hilar or axillary lymph nodes. The esophagus is grossly  unremarkable. Lungs/Pleura: Scattered pulmonary nodules.  For reference: -right upper lobe solid 3 mm pulmonary nodule on image 39/4. -irregular 5 mm right upper lobe pulmonary nodule on image 49/4. -subpleural 6 mm pulmonary nodule in the lingula on image 101/4. Musculoskeletal: No aggressive lytic or blastic lesion of bone. CT ABDOMEN PELVIS  FINDINGS Hepatobiliary: Diffuse hepatic steatosis. Cholelithiasis without findings of acute cholecystitis. No biliary ductal dilation. Pancreas: No pancreatic ductal dilation or evidence of acute inflammation. Spleen: No splenomegaly. Adrenals/Urinary Tract: No suspicious adrenal nodule/mass. No hydronephrosis. Kidneys demonstrate symmetric enhancement. Urinary bladder is unremarkable for degree of distension. Stomach/Bowel: Stomach is distended with ingested material and gas without focal wall thickening. Colonic stool burden suggestive of constipation. No evidence of bowel obstruction. Vascular/Lymphatic: Aortic atherosclerosis. Enlarged bilateral iliac side chain and pelvic sidewall lymph nodes. For reference: -enlarged left common iliac lymph node measures 13 mm in short axis on image 106/2. -enlarged right external iliac lymph node measures 18 mm in short axis on image 105/2. Reproductive: Heterogeneous irregular masslike expansion centered in the lower uterine segment best seen on image 78/6 compatible with patient's known endometrial carcinoma. Other: No significant abdominopelvic free fluid. Musculoskeletal: No aggressive lytic or blastic lesion of bone. 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 Multilevel degenerative changes spine. IMPRESSION: 1. Heterogeneous irregular masslike expansion centered in the lower uterine segment compatible with patient's known endometrial carcinoma. 2. Enlarged bilateral iliac side chain and pelvic sidewall lymph nodes, compatible with nodal disease involvement. 3. Scattered pulmonary  nodules measuring up to 6 mm, nonspecific but suspicious for metastatic disease. Consider attention on short-term interval follow-up chest CT. 4. Diffuse hepatic steatosis. 5. Cholelithiasis without findings of acute cholecystitis. 6. Colonic stool burden suggestive of constipation. 7.  Aortic Atherosclerosis (ICD10-I70.0). Electronically Signed   By: Reyes Holder M.D.   On: 11/10/2023 11:56    Assessment and plan- Patient is a 68 y.o. female with history of newly diagnosed grade 2 endometrioid endometrial carcinoma FIGO stage III C1 cTX N1a M0 here to discuss further management  Endometrioid endometrial carcinoma was diagnosed based on endometrial biopsy and therefore accurate T stage cannot be ascertained.I have reviewed CT chest abdomen pelvis images independently and discussed findings with the patient which shows concern for bilateral iliac side chain and pelvic sidewall lymph node involvement which would be consistent with the C1 disease.  She does have subcentimeter lung nodules as well which are nonspecific but could be metastatic disease as well and short-term follow-up was recommended.  Given the local regional lymph node involvement as well as possible lung involvement and her performance status GYN oncology Dr. Elby has recommended proceeding with systemic treatment first.  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  She would benefit from combination CarboTaxol Keytruda chemotherapy.  Based on phase 3 trial Of first-line endometrial cancer treatment with or without Keytruda along with CarboTaxol- In the 56-month analysis, Kaplan-Meier estimates of progression-free survival in the dMMR cohort were 74% in the pembrolizumab group and 38% in the placebo group (hazard ratio for progression or death, 0.30; 95% confidence interval [CI], 0.19 to 0.48; P<0.001), a 70% difference in relative risk. In the pMMR cohort, median  progression-free survival was 13.1 months with pembrolizumab and 8.7 months with placebo (hazard ratio, 0.54; 95% CI, 0.41 to 0.71; P<0.001)  Given her frailty I will start off with carboplatin AUC 4 and Taxol at 135 mg/m for cycle 1 and if she tolerates it well I will increase the dosing with second cycle.  She will receive Keytruda at 200 mg IV.  This will be given IV every 3 weeks for up to 6 cycles followed by consideration for Keytruda every 6 weeks for up to 18 cycles or until progression or toxicity.  Will repeat scans after 3 cycles.  Discussed risks and benefits of chemotherapy including all but not limited to nausea vomiting low blood counts risk of infections and hospitalization, hair loss and peripheral neuropathy associated with chemotherapy.  Discussed risks and benefits of immunotherapy including all but not limited to possible autoimmune side effects such as thyroiditis endocrinopathies colitis diarrhea and skin rash.  Treatment will be given with a palliative intent.  Patient understands and agrees to proceed as planned.  Depending on her response to treatment surgery could be potentially considered down the line.  Iron  deficiency anemia: She will receive dose 2 of Venofer  today and will plan for 3 more doses in the future.  Plan for port placement and chemo teach and tentatively start chemotherapy in 2 weeks time    Cancer Staging  Endometrial cancer [facility]) Staging form: Corpus Uteri - Carcinoma and Carcinosarcoma, AJCC 8th Edition and FIGO 2023 - Clinical stage from 12/02/2023: FIGO Stage IIIC1, calculated as Stage Unknown (cTX, cN1a, cM0) - Signed by Melanee Annah BROCKS, MD on 12/02/2023 Histologic grade (G): G2 Histologic grading system: 3 grade system - Pathologic stage from 12/02/2023: FIGO Stage IIIC - Unsigned Histopathologic type: Endometrioid adenocarcinoma, NOS Stage prefix: Initial diagnosis     Thank you for this kind referral and the opportunity to participate in the care  of this patient   Visit Diagnosis 1. Endometrial cancer (HCC)   2. Iron  deficiency anemia due to chronic blood loss   3. Goals of care, counseling/discussion     Dr. Annah Melanee, MD, MPH [facility] at [facility] [phone] 12/02/2023

## 2023-12-02 NOTE — Progress Notes (Signed)
 START ON PATHWAY REGIMEN - Uterine     Cycles 1 through 6: A cycle is every 21 days:     Pembrolizumab      Paclitaxel      Carboplatin    Cycles 7 and beyond: A cycle is every 42 days:     Pembrolizumab   **Always confirm dose/schedule in your pharmacy ordering system**  Patient Characteristics: Endometrioid, Newly Diagnosed (Clinical Staging), Nonsurgical Candidate, Stage III-IV, MSI-H/dMMR Histology: Endometrioid Therapeutic Status: Newly Diagnosed (Clinical Staging) AJCC M Category: cM0 AJCC 8 Stage Grouping: IIIC1 AJCC T Category: cTX AJCC N Category: cNX Microsatellite/Mismatch Repair Status: MSI-H/dMMR Intent of Therapy: Curative Intent, Discussed with Patient

## 2023-12-03 ENCOUNTER — Other Ambulatory Visit: Payer: Self-pay

## 2023-12-03 ENCOUNTER — Telehealth: Payer: Self-pay

## 2023-12-03 DIAGNOSIS — I5020 Unspecified systolic (congestive) heart failure: Secondary | ICD-10-CM | POA: Insufficient documentation

## 2023-12-03 NOTE — Telephone Encounter (Signed)
 Patient in need of a port.  Per Valerie Leonard, I can put her on Thurs 9/4 at 10:30a an arrive at 9:30a.SABRA  Spoke to patient who agreed to port placement next Thurs 9/4 at 10:30am appt time, arrival time 9:30am. Reiterated appointment details, nothing by mouth after midnight, procedure to be done at the [facility] and would need a driver present. Reminded that she will also receie a call from Marissa the day before to discuss the same details above.  Patient also mentioned her mother recently passed.  Says her sister typically is the one who takes care of any details and her sister is at her mother's funeral at this moment. Provided number to center to have her sister call back and review the above details to make sure that everything she noted is correct.  Staff message sent to [facility] informing patient has accepted appointment.

## 2023-12-04 ENCOUNTER — Encounter: Payer: Self-pay | Admitting: Oncology

## 2023-12-04 ENCOUNTER — Encounter: Payer: Self-pay | Admitting: Hospice and Palliative Medicine

## 2023-12-04 NOTE — Progress Notes (Signed)
 Pharmacist Chemotherapy Monitoring - Initial Assessment    Anticipated start date: 12/18/23   The following has been reviewed per standard work regarding the patient's treatment regimen: The patient's diagnosis, treatment plan and drug doses, and organ/hematologic function Lab orders and baseline tests specific to treatment regimen  The treatment plan start date, drug sequencing, and pre-medications Prior authorization status  Patient's documented medication list, including drug-drug interaction screen and prescriptions for anti-emetics and supportive care specific to the treatment regimen The drug concentrations, fluid compatibility, administration routes, and timing of the medications to be used The patient's access for treatment and lifetime cumulative dose history, if applicable  The patient's medication allergies and previous infusion related reactions, if applicable   Changes made to treatment plan:  N/A  Follow up needed:  N/A   Maudie FORBES Andreas, PharmD, BCPS Clinical Pharmacist   12/04/2023  2:00 PM

## 2023-12-08 NOTE — Telephone Encounter (Signed)
 Voicemail received from sister Olam on 12/08/23 at 8:57am requesting a call back to review port placement appointment information.  Caregiver requested a call back at [phone].  Outbound call; spoke to Vowinckel and informed of below.  Reviewed upcoming appointments with caregiver including first chemo. Placed note in chart to contact Olam for all scheduling needs (media scanned in 12/03/23).

## 2023-12-09 ENCOUNTER — Encounter: Payer: Self-pay | Admitting: Obstetrics and Gynecology

## 2023-12-09 ENCOUNTER — Other Ambulatory Visit: Payer: Self-pay | Admitting: Diagnostic Radiology

## 2023-12-09 NOTE — Progress Notes (Signed)
 Patient for IR Port Placement on Thurs 12/10/23, I called and spoke with the patient on the phone and gave pre-procedure instructions. Pt was made aware to be here at 9:30a, NPO after MN prior to procedure as well as driver post procedure/recovery/discharge. Pt stated understanding.  Called 12/09/23

## 2023-12-09 NOTE — H&P (Shared)
 Chief Complaint: Patient was seen in consultation today for endometrial cancer with consideration for Port-A-Cath placement.  Referring Provider(s): Dr. Annah Skene, MD   Supervising Physician: Philip Cornet  Patient Status: [facility] - Out-pt  Patient is Full Code  History of Present Illness: Valerie Leonard is a 68 y.o. female  with PMHx notable for endometrial cancer, chronic EtOH use, anemia, and others as delineated below.  Per Dr. 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 Elby has recommended proceeding with systemic treatment first.  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  She would benefit from combination CarboTaxol Keytruda chemotherapy.   [...] Plan for port placement and chemo teach and tentatively start chemotherapy in 2 weeks time.   Interventional Radiology was requested for Port-A-Cath placement. Patient is scheduled for same in IR today.   ***Patient is alert and laying in bed, calm.  Patient is currently without any significant complaints.  Patient denies any fevers, headache, chest pain, SOB, cough, abdominal pain, nausea, vomiting or bleeding.     Past Medical History:  Diagnosis Date   Acute delirium    Acute metabolic encephalopathy    Anemia    blood loss anemia   Chronic alcohol abuse    Depression    Endometrial cancer (HCC)    Hypokalemia    Hypomagnesemia    Pressure injury of skin    Severe major depression, single episode, without psychotic features (HCC)    Vaginal bleeding     No past surgical history on file.  Allergies: Patient has no known allergies.  Medications: Prior to Admission medications    Medication Sig Start Date End Date Taking? Authorizing Provider  citalopram  (CELEXA ) 20 MG tablet Take 1 tablet (20 mg total) by mouth daily. Patient not taking: Reported on 11/04/2023 07/28/20   Patel, Sona, MD  dexamethasone  (DECADRON ) 4 MG tablet Take 2 tablets (8mg ) by mouth daily starting the day after carboplatin for 3 days. Take with food 12/02/23   Skene Annah JAYSON, MD  escitalopram  (LEXAPRO ) 5 MG tablet Take 1 tablet every day by oral route in the morning, for depression. Patient not taking: Reported on 12/02/2023 11/24/23   [provider]  folic acid  (FOLVITE ) 1 MG tablet Take 1 tablet (1 mg total) by mouth daily. Patient not taking: Reported on 11/04/2023 07/28/20   Patel, Sona, MD  lidocaine -prilocaine  (EMLA ) cream Apply to affected area once 12/02/23   Skene Annah JAYSON, MD  medroxyPROGESTERone  (PROVERA ) 10 MG tablet Take 1 tablet (10 mg total) by mouth 2 (two) times daily for 7 days. Patient not taking: Reported on 11/04/2023 09/29/23 10/06/23  Dorinda Drue DASEN, MD  Multiple Vitamin (MULTIVITAMIN WITH MINERALS) TABS tablet Take 1 tablet by mouth daily. 07/28/20   Patel, Sona, MD  ondansetron  (ZOFRAN ) 8 MG tablet Take 1 tablet (8 mg total) by mouth every 8 (eight) hours as needed for nausea or vomiting. Start on the third day after carboplatin. 12/02/23   Skene Annah JAYSON, MD  prochlorperazine  (COMPAZINE ) 10 MG tablet Take 1 tablet (10 mg total) by mouth every 6 (six) hours as needed for nausea or vomiting. 12/02/23   Skene Annah JAYSON, MD  spironolactone (ALDACTONE) 25  MG tablet Take 12.5 mg by mouth. 11/17/23 11/16/24  [provider]     No family history on file.  Social History   Socioeconomic History   Marital status: Single    Spouse name: Not on file   Number of children: Not on file   Years of education: Not on file   Highest education level: Not on file  Occupational History   Not on file  Tobacco Use   Smoking status: Former    Types: Cigarettes, Cigars   Smokeless  tobacco: Never  Vaping Use   Vaping status: Never Used  Substance and Sexual Activity   Alcohol use: Yes    Comment: More than 4 glasses of wine, mixed drinks, beer daily.    Drug use: Not Currently    Comment: former use of marijuana   Sexual activity: Not Currently    Birth control/protection: None  Other Topics Concern   Not on file  Social History Narrative   Not on file   Social Drivers of Health   Financial Resource Strain: Low Risk  (10/13/2023)   Received from [facility]   Overall Financial Resource Strain (CARDIA)    Difficulty of Paying Living Expenses: Not very hard  Food Insecurity: No Food Insecurity (12/02/2023)   Hunger Vital Sign    Worried About Running Out of Food in the Last Year: Never true    Ran Out of Food in the Last Year: Never true  Recent Concern: Food Insecurity - Food Insecurity Present (09/27/2023)   Hunger Vital Sign    Worried About Running Out of Food in the Last Year: Sometimes true    Ran Out of Food in the Last Year: Sometimes true  Transportation Needs: No Transportation Needs (12/02/2023)   PRAPARE - Administrator, Civil Service (Medical): No    Lack of Transportation (Non-Medical): No  Recent Concern: Transportation Needs - Unmet Transportation Needs (09/27/2023)   PRAPARE - Administrator, Civil Service (Medical): Yes    Lack of Transportation (Non-Medical): Yes  Physical Activity: Not on file  Stress: Not on file  Social Connections: Socially Isolated (09/28/2023)   Social Connection and Isolation Panel    Frequency of Communication with Friends and Family: Never    Frequency of Social Gatherings with Friends and Family: Never    Attends Religious Services: Never    Database administrator or Organizations: No    Attends Banker Meetings: Never    Marital Status: Widowed     Review of Systems: A 12 point ROS discussed and pertinent positives are indicated in the HPI above.  All  other systems are negative.  Vital Signs: There were no vitals taken for this visit.  Advance Care Plan: The advanced care place/surrogate decision maker was discussed at the time of visit and the patient did not wish to discuss or was not able to name a surrogate decision maker or provide an advance care plan.  Physical Exam  Imaging: DG Chest Portable 1 View Result Date: 11/22/2023 CLINICAL DATA:  Fatigue, hypotension EXAM: PORTABLE CHEST 1 VIEW COMPARISON:  09/28/2023 FINDINGS: Single frontal view of the chest demonstrates an unremarkable cardiac silhouette. No airspace disease, effusion, or pneumothorax. No acute bony abnormalities. IMPRESSION: 1. No acute intrathoracic process. Electronically Signed   By: Ozell Daring M.D.   On: 11/22/2023 15:44    Labs:  CBC: Recent Labs    11/04/23 1030 11/06/23 1033 11/11/23 1102 11/22/23  1500  WBC 15.2* 14.6* 12.8* 11.6*  HGB 8.1* 7.9* 10.3* 10.0*  HCT 26.1* 26.1* 32.2* 31.8*  PLT 603* 564* 587* 607*    COAGS: Recent Labs    09/27/23 1459 11/04/23 1030  INR 1.3* 1.1  APTT  --  44*    BMP: Recent Labs    11/04/23 1030 11/06/23 1033 11/11/23 1102 11/22/23 1500  NA 131* 132* 134* 135  K 3.5 3.8 3.6 3.1*  CL 98 100 99 99  CO2 23 23 25 22   GLUCOSE 98 123* 108* 92  BUN 8 10 11  6*  CALCIUM  8.7* 8.5* 8.9 8.8*  CREATININE 0.51 0.55 0.46 0.63  GFRNONAA >60 >60 >60 >60    LIVER FUNCTION TESTS: Recent Labs    11/04/23 1030 11/06/23 1033 11/11/23 1102 11/22/23 1500  BILITOT 0.4 0.3 0.5 0.5  AST 20 29 19 24   ALT 11 13 12 24   ALKPHOS 93 86 86 76  PROT 7.9 7.3 7.8 7.9  ALBUMIN 2.8* 2.6* 2.8* 2.8*    TUMOR MARKERS: No results for input(s): AFPTM, CEA, CA199, CHROMGRNA in the last 8760 hours.  Assessment and Plan: Per Dr. 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 Elby has recommended proceeding with systemic treatment first.  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  She would benefit from combination CarboTaxol Keytruda chemotherapy.   [...] Plan for port placement and chemo teach and tentatively start chemotherapy in 2 weeks time.  Patient presents for scheduled Port-A-Cath placement in IR today.  ***Patient has been NPO since midnight.  All labs and medications are within acceptable parameters.  No pertinent allergies.   Risks and benefits of image guided port-a-catheter placement was discussed with the patient including, but not limited to bleeding, infection, pneumothorax, or fibrin sheath development and need for additional procedures.  All of the patient's questions were answered, patient is agreeable to proceed. Consent signed and in chart.    Thank you for allowing our service to participate in Valerie Leonard 's care.  Electronically Signed: Carlin DELENA Griffon, PA-C   12/09/2023, 10:12 AM      I spent a total of 30 Minutes in face to face in clinical consultation, greater than 50% of which was counseling/coordinating care for endometrial cancer with consideration for Port-A-Cath placement.

## 2023-12-10 ENCOUNTER — Inpatient Hospital Stay

## 2023-12-10 ENCOUNTER — Inpatient Hospital Stay
Admission: EM | Admit: 2023-12-10 | Discharge: 2023-12-25 | DRG: 749 | Disposition: A | Discharge status: Skilled Nursing Facility | Attending: Internal Medicine | Admitting: Internal Medicine

## 2023-12-10 ENCOUNTER — Telehealth: Payer: Self-pay

## 2023-12-10 ENCOUNTER — Emergency Department

## 2023-12-10 ENCOUNTER — Ambulatory Visit
Admission: RE | Admit: 2023-12-10 | Discharge: 2023-12-10 | Disposition: A | Discharge status: Home / Self Care | Source: Ambulatory Visit | Attending: Oncology | Admitting: Oncology

## 2023-12-10 ENCOUNTER — Other Ambulatory Visit: Payer: Self-pay | Admitting: Oncology

## 2023-12-10 ENCOUNTER — Encounter: Payer: Self-pay | Admitting: Radiology

## 2023-12-10 ENCOUNTER — Encounter: Payer: Self-pay | Admitting: Nurse Practitioner

## 2023-12-10 ENCOUNTER — Inpatient Hospital Stay (HOSPITAL_BASED_OUTPATIENT_CLINIC_OR_DEPARTMENT_OTHER): Admitting: Nurse Practitioner

## 2023-12-10 ENCOUNTER — Other Ambulatory Visit: Payer: Self-pay | Admitting: *Deleted

## 2023-12-10 ENCOUNTER — Ambulatory Visit

## 2023-12-10 ENCOUNTER — Other Ambulatory Visit: Payer: Self-pay

## 2023-12-10 VITALS — BP 98/50 | HR 91 | Temp 101.2°F | Resp 20

## 2023-12-10 VITALS — BP 84/66 | HR 137 | Temp 101.1°F | Resp 18

## 2023-12-10 DIAGNOSIS — R509 Fever, unspecified: Secondary | ICD-10-CM

## 2023-12-10 DIAGNOSIS — L89302 Pressure ulcer of unspecified buttock, stage 2: Secondary | ICD-10-CM | POA: Diagnosis present

## 2023-12-10 DIAGNOSIS — Z87891 Personal history of nicotine dependence: Secondary | ICD-10-CM | POA: Insufficient documentation

## 2023-12-10 DIAGNOSIS — D649 Anemia, unspecified: Secondary | ICD-10-CM | POA: Diagnosis present

## 2023-12-10 DIAGNOSIS — D500 Iron deficiency anemia secondary to blood loss (chronic): Secondary | ICD-10-CM | POA: Diagnosis present

## 2023-12-10 DIAGNOSIS — Z79899 Other long term (current) drug therapy: Secondary | ICD-10-CM

## 2023-12-10 DIAGNOSIS — Z803 Family history of malignant neoplasm of breast: Secondary | ICD-10-CM

## 2023-12-10 DIAGNOSIS — D72829 Elevated white blood cell count, unspecified: Secondary | ICD-10-CM | POA: Insufficient documentation

## 2023-12-10 DIAGNOSIS — R109 Unspecified abdominal pain: Secondary | ICD-10-CM | POA: Insufficient documentation

## 2023-12-10 DIAGNOSIS — C541 Malignant neoplasm of endometrium: Secondary | ICD-10-CM

## 2023-12-10 DIAGNOSIS — D638 Anemia in other chronic diseases classified elsewhere: Secondary | ICD-10-CM | POA: Diagnosis present

## 2023-12-10 DIAGNOSIS — D62 Acute posthemorrhagic anemia: Secondary | ICD-10-CM | POA: Diagnosis present

## 2023-12-10 DIAGNOSIS — N950 Postmenopausal bleeding: Secondary | ICD-10-CM | POA: Diagnosis present

## 2023-12-10 DIAGNOSIS — Z170 Estrogen receptor positive status [ER+]: Secondary | ICD-10-CM | POA: Diagnosis not present

## 2023-12-10 DIAGNOSIS — Z95828 Presence of other vascular implants and grafts: Secondary | ICD-10-CM | POA: Diagnosis not present

## 2023-12-10 DIAGNOSIS — G9341 Metabolic encephalopathy: Secondary | ICD-10-CM | POA: Diagnosis present

## 2023-12-10 DIAGNOSIS — M545 Low back pain, unspecified: Secondary | ICD-10-CM | POA: Insufficient documentation

## 2023-12-10 DIAGNOSIS — F1010 Alcohol abuse, uncomplicated: Secondary | ICD-10-CM | POA: Diagnosis present

## 2023-12-10 DIAGNOSIS — Z8542 Personal history of malignant neoplasm of other parts of uterus: Secondary | ICD-10-CM

## 2023-12-10 DIAGNOSIS — C540 Malignant neoplasm of isthmus uteri: Secondary | ICD-10-CM | POA: Diagnosis present

## 2023-12-10 DIAGNOSIS — I959 Hypotension, unspecified: Secondary | ICD-10-CM | POA: Insufficient documentation

## 2023-12-10 DIAGNOSIS — R197 Diarrhea, unspecified: Secondary | ICD-10-CM | POA: Diagnosis not present

## 2023-12-10 DIAGNOSIS — N719 Inflammatory disease of uterus, unspecified: Secondary | ICD-10-CM | POA: Diagnosis present

## 2023-12-10 DIAGNOSIS — Z792 Long term (current) use of antibiotics: Secondary | ICD-10-CM

## 2023-12-10 DIAGNOSIS — E43 Unspecified severe protein-calorie malnutrition: Secondary | ICD-10-CM | POA: Diagnosis present

## 2023-12-10 DIAGNOSIS — D509 Iron deficiency anemia, unspecified: Secondary | ICD-10-CM | POA: Insufficient documentation

## 2023-12-10 DIAGNOSIS — Z6820 Body mass index (BMI) 20.0-20.9, adult: Secondary | ICD-10-CM

## 2023-12-10 DIAGNOSIS — Z87440 Personal history of urinary (tract) infections: Secondary | ICD-10-CM | POA: Insufficient documentation

## 2023-12-10 DIAGNOSIS — D75839 Thrombocytosis, unspecified: Secondary | ICD-10-CM | POA: Insufficient documentation

## 2023-12-10 DIAGNOSIS — Z515 Encounter for palliative care: Secondary | ICD-10-CM

## 2023-12-10 DIAGNOSIS — Z1152 Encounter for screening for COVID-19: Secondary | ICD-10-CM | POA: Diagnosis not present

## 2023-12-10 DIAGNOSIS — Z538 Procedure and treatment not carried out for other reasons: Secondary | ICD-10-CM | POA: Insufficient documentation

## 2023-12-10 DIAGNOSIS — R000 Tachycardia, unspecified: Secondary | ICD-10-CM | POA: Insufficient documentation

## 2023-12-10 DIAGNOSIS — Z510 Encounter for antineoplastic radiation therapy: Secondary | ICD-10-CM | POA: Insufficient documentation

## 2023-12-10 DIAGNOSIS — N939 Abnormal uterine and vaginal bleeding, unspecified: Secondary | ICD-10-CM

## 2023-12-10 DIAGNOSIS — Z7189 Other specified counseling: Secondary | ICD-10-CM

## 2023-12-10 DIAGNOSIS — R6510 Systemic inflammatory response syndrome (SIRS) of non-infectious origin without acute organ dysfunction: Secondary | ICD-10-CM | POA: Diagnosis present

## 2023-12-10 DIAGNOSIS — R6520 Severe sepsis without septic shock: Secondary | ICD-10-CM | POA: Diagnosis not present

## 2023-12-10 DIAGNOSIS — E860 Dehydration: Secondary | ICD-10-CM

## 2023-12-10 DIAGNOSIS — R5381 Other malaise: Secondary | ICD-10-CM | POA: Diagnosis present

## 2023-12-10 DIAGNOSIS — D630 Anemia in neoplastic disease: Secondary | ICD-10-CM | POA: Diagnosis present

## 2023-12-10 DIAGNOSIS — F322 Major depressive disorder, single episode, severe without psychotic features: Secondary | ICD-10-CM | POA: Diagnosis present

## 2023-12-10 DIAGNOSIS — A419 Sepsis, unspecified organism: Secondary | ICD-10-CM | POA: Diagnosis present

## 2023-12-10 DIAGNOSIS — R918 Other nonspecific abnormal finding of lung field: Secondary | ICD-10-CM | POA: Insufficient documentation

## 2023-12-10 DIAGNOSIS — R110 Nausea: Secondary | ICD-10-CM | POA: Diagnosis not present

## 2023-12-10 HISTORY — PX: IR PATIENT EVAL TECH 0-60 MINS: IMG5564

## 2023-12-10 LAB — COMPREHENSIVE METABOLIC PANEL WITH GFR
ALT: 11 U/L (ref 0–44)
AST: 25 U/L (ref 15–41)
Albumin: 2.3 g/dL — ABNORMAL LOW (ref 3.5–5.0)
Alkaline Phosphatase: 63 U/L (ref 38–126)
Anion gap: 9 (ref 5–15)
BUN: 12 mg/dL (ref 8–23)
CO2: 22 mmol/L (ref 22–32)
Calcium: 7.9 mg/dL — ABNORMAL LOW (ref 8.9–10.3)
Chloride: 104 mmol/L (ref 98–111)
Creatinine, Ser: 0.52 mg/dL (ref 0.44–1.00)
GFR, Estimated: >60 mL/min (ref >60)
Glucose, Bld: 143 mg/dL — ABNORMAL HIGH (ref 70–99)
Potassium: 3.6 mmol/L (ref 3.5–5.1)
Sodium: 135 mmol/L (ref 135–145)
Total Bilirubin: 0.5 mg/dL (ref 0.0–1.2)
Total Protein: 7.3 g/dL (ref 6.5–8.1)

## 2023-12-10 LAB — CBC WITH DIFFERENTIAL/PLATELET
Abs Immature Granulocytes: 0.21 K/uL — ABNORMAL HIGH (ref 0.00–0.07)
Basophils Absolute: 0.1 K/uL (ref 0.0–0.1)
Basophils Relative: 0 %
Eosinophils Absolute: 0 K/uL (ref 0.0–0.5)
Eosinophils Relative: 0 %
HCT: 28.2 % — ABNORMAL LOW (ref 36.0–46.0)
Hemoglobin: 8.7 g/dL — ABNORMAL LOW (ref 12.0–15.0)
Immature Granulocytes: 1 %
Lymphocytes Relative: 7 %
Lymphs Abs: 1.8 K/uL (ref 0.7–4.0)
MCH: 25.8 pg — ABNORMAL LOW (ref 26.0–34.0)
MCHC: 30.9 g/dL (ref 30.0–36.0)
MCV: 83.7 fL (ref 80.0–100.0)
Monocytes Absolute: 1.6 K/uL — ABNORMAL HIGH (ref 0.1–1.0)
Monocytes Relative: 6 %
Neutro Abs: 23.3 K/uL — ABNORMAL HIGH (ref 1.7–7.7)
Neutrophils Relative %: 86 %
Platelets: 611 K/uL — ABNORMAL HIGH (ref 150–400)
RBC: 3.37 MIL/uL — ABNORMAL LOW (ref 3.87–5.11)
RDW: 17.8 % — ABNORMAL HIGH (ref 11.5–15.5)
Smear Review: NORMAL
WBC: 27.1 K/uL — ABNORMAL HIGH (ref 4.0–10.5)
nRBC: 0 % (ref 0.0–0.2)

## 2023-12-10 LAB — CBC WITH DIFFERENTIAL (CANCER CENTER ONLY)
Abs Immature Granulocytes: 0.32 K/uL — ABNORMAL HIGH (ref 0.00–0.07)
Basophils Absolute: 0.1 K/uL (ref 0.0–0.1)
Basophils Relative: 0 %
Eosinophils Absolute: 0 K/uL (ref 0.0–0.5)
Eosinophils Relative: 0 %
HCT: 32 % — ABNORMAL LOW (ref 36.0–46.0)
Hemoglobin: 9.9 g/dL — ABNORMAL LOW (ref 12.0–15.0)
Immature Granulocytes: 1 %
Lymphocytes Relative: 6 %
Lymphs Abs: 1.6 K/uL (ref 0.7–4.0)
MCH: 25.6 pg — ABNORMAL LOW (ref 26.0–34.0)
MCHC: 30.9 g/dL (ref 30.0–36.0)
MCV: 82.9 fL (ref 80.0–100.0)
Monocytes Absolute: 0.9 K/uL (ref 0.1–1.0)
Monocytes Relative: 3 %
Neutro Abs: 23 K/uL — ABNORMAL HIGH (ref 1.7–7.7)
Neutrophils Relative %: 90 %
Platelet Count: 679 K/uL — ABNORMAL HIGH (ref 150–400)
RBC: 3.86 MIL/uL — ABNORMAL LOW (ref 3.87–5.11)
RDW: 18.1 % — ABNORMAL HIGH (ref 11.5–15.5)
WBC Count: 25.9 K/uL — ABNORMAL HIGH (ref 4.0–10.5)
nRBC: 0 % (ref 0.0–0.2)

## 2023-12-10 LAB — CMP (CANCER CENTER ONLY)
ALT: 13 U/L (ref 0–44)
AST: 26 U/L (ref 15–41)
Albumin: 2.7 g/dL — ABNORMAL LOW (ref 3.5–5.0)
Alkaline Phosphatase: 71 U/L (ref 38–126)
Anion gap: 13 (ref 5–15)
BUN: 14 mg/dL (ref 8–23)
CO2: 23 mmol/L (ref 22–32)
Calcium: 8.9 mg/dL (ref 8.9–10.3)
Chloride: 99 mmol/L (ref 98–111)
Creatinine: 0.61 mg/dL (ref 0.44–1.00)
GFR, Estimated: >60 mL/min (ref >60)
Glucose, Bld: 101 mg/dL — ABNORMAL HIGH (ref 70–99)
Potassium: 3.9 mmol/L (ref 3.5–5.1)
Sodium: 135 mmol/L (ref 135–145)
Total Bilirubin: 0.6 mg/dL (ref 0.0–1.2)
Total Protein: 8 g/dL (ref 6.5–8.1)

## 2023-12-10 LAB — URINALYSIS, W/ REFLEX TO CULTURE (INFECTION SUSPECTED)
Bilirubin Urine: NEGATIVE
Glucose, UA: NEGATIVE mg/dL
Hgb urine dipstick: NEGATIVE
Ketones, ur: 5 mg/dL — AB
Leukocytes,Ua: NEGATIVE
Nitrite: NEGATIVE
Protein, ur: 30 mg/dL — AB
Specific Gravity, Urine: 1.025 (ref 1.005–1.030)
Squamous Epithelial / HPF: 0 /HPF (ref 0–5)
pH: 5 (ref 5.0–8.0)

## 2023-12-10 LAB — CBC
HCT: 32.1 % — ABNORMAL LOW (ref 36.0–46.0)
Hemoglobin: 9.8 g/dL — ABNORMAL LOW (ref 12.0–15.0)
MCH: 25.5 pg — ABNORMAL LOW (ref 26.0–34.0)
MCHC: 30.5 g/dL (ref 30.0–36.0)
MCV: 83.4 fL (ref 80.0–100.0)
Platelets: 625 K/uL — ABNORMAL HIGH (ref 150–400)
RBC: 3.85 MIL/uL — ABNORMAL LOW (ref 3.87–5.11)
RDW: 17.8 % — ABNORMAL HIGH (ref 11.5–15.5)
WBC: 23.5 K/uL — ABNORMAL HIGH (ref 4.0–10.5)
nRBC: 0 % (ref 0.0–0.2)

## 2023-12-10 LAB — RESP PANEL BY RT-PCR (RSV, FLU A&B, COVID)  RVPGX2
Influenza A by PCR: NEGATIVE
Influenza B by PCR: NEGATIVE
Resp Syncytial Virus by PCR: NEGATIVE
SARS Coronavirus 2 by RT PCR: NEGATIVE

## 2023-12-10 LAB — LACTIC ACID, PLASMA
Lactic Acid, Venous: 2.2 mmol/L (ref 0.5–1.9)
Lactic Acid, Venous: 1.3 mmol/L (ref 0.5–1.9)

## 2023-12-10 LAB — PROCALCITONIN: Procalcitonin: 5.76 ng/mL

## 2023-12-10 MED ORDER — MIDODRINE HCL 5 MG PO TABS
10.0000 mg | ORAL_TABLET | Freq: Once | ORAL | Status: AC
  Administered 2023-12-10: 10 mg via ORAL
  Filled 2023-12-10: qty 2

## 2023-12-10 MED ORDER — METRONIDAZOLE 500 MG/100ML IV SOLN
500.0000 mg | Freq: Two times a day (BID) | INTRAVENOUS | Status: AC
  Administered 2023-12-11 – 2023-12-17 (×14): 500 mg via INTRAVENOUS
  Filled 2023-12-10 (×14): qty 100

## 2023-12-10 MED ORDER — SODIUM CHLORIDE 0.9 % IV BOLUS
1000.0000 mL | Freq: Once | INTRAVENOUS | Status: AC
  Administered 2023-12-10: 1000 mL via INTRAVENOUS

## 2023-12-10 MED ORDER — VANCOMYCIN HCL IN DEXTROSE 1-5 GM/200ML-% IV SOLN: 1000.0000 mg | Freq: Once | INTRAVENOUS | Status: DC

## 2023-12-10 MED ORDER — ACETAMINOPHEN 500 MG PO TABS
1000.0000 mg | ORAL_TABLET | Freq: Once | ORAL | Status: AC
  Administered 2023-12-10: 1000 mg via ORAL
  Filled 2023-12-10: qty 2

## 2023-12-10 MED ORDER — LIDOCAINE HCL 1 % IJ SOLN
INTRAMUSCULAR | Status: AC
  Filled 2023-12-10: qty 20

## 2023-12-10 MED ORDER — LACTATED RINGERS IV BOLUS
1000.0000 mL | Freq: Once | INTRAVENOUS | Status: AC
  Administered 2023-12-10: 1000 mL via INTRAVENOUS

## 2023-12-10 MED ORDER — SODIUM CHLORIDE 0.9 % IV BOLUS
500.0000 mL | Freq: Once | INTRAVENOUS | Status: AC
  Administered 2023-12-10: 500 mL via INTRAVENOUS

## 2023-12-10 MED ORDER — SODIUM CHLORIDE 0.9 % IV SOLN
INTRAVENOUS | Status: DC
  Filled 2023-12-10 (×2): qty 250

## 2023-12-10 MED ORDER — ACETAMINOPHEN 650 MG RE SUPP
650.0000 mg | Freq: Four times a day (QID) | RECTAL | Status: DC | PRN
  Administered 2023-12-17: 650 mg via RECTAL

## 2023-12-10 MED ORDER — ONDANSETRON HCL 4 MG PO TABS
4.0000 mg | ORAL_TABLET | Freq: Four times a day (QID) | ORAL | Status: DC | PRN
Start: 2023-12-10 — End: 2023-12-25

## 2023-12-10 MED ORDER — PIPERACILLIN-TAZOBACTAM 3.375 G IVPB 30 MIN
3.3750 g | Freq: Once | INTRAVENOUS | Status: AC
  Administered 2023-12-10: 3.375 g via INTRAVENOUS
  Filled 2023-12-10: qty 50

## 2023-12-10 MED ORDER — LIDOCAINE-EPINEPHRINE 1 %-1:100000 IJ SOLN
INTRAMUSCULAR | Status: AC
  Filled 2023-12-10: qty 1

## 2023-12-10 MED ORDER — SODIUM CHLORIDE 0.9 % IV SOLN
2.0000 g | Freq: Two times a day (BID) | INTRAVENOUS | Status: DC
  Administered 2023-12-10 – 2023-12-14 (×8): 2 g via INTRAVENOUS
  Filled 2023-12-10 (×8): qty 12.5

## 2023-12-10 MED ORDER — SODIUM CHLORIDE 0.9 % IV SOLN: 2.0000 g | Freq: Once | INTRAVENOUS | Status: DC

## 2023-12-10 MED ORDER — VANCOMYCIN HCL 750 MG/150ML IV SOLN
750.0000 mg | INTRAVENOUS | Status: DC
  Administered 2023-12-11: 750 mg via INTRAVENOUS
  Filled 2023-12-10 (×3): qty 150

## 2023-12-10 MED ORDER — LACTATED RINGERS IV SOLN
150.0000 mL/h | INTRAVENOUS | Status: DC
Start: 2023-12-10 — End: 2023-12-10
  Administered 2023-12-10: 150 mL/h via INTRAVENOUS

## 2023-12-10 MED ORDER — ONDANSETRON HCL 4 MG/2ML IJ SOLN
4.0000 mg | Freq: Four times a day (QID) | INTRAMUSCULAR | Status: DC | PRN
  Administered 2023-12-14 – 2023-12-21 (×4): 4 mg via INTRAVENOUS
  Filled 2023-12-10 (×4): qty 2

## 2023-12-10 MED ORDER — SODIUM CHLORIDE 0.9 % IV SOLN: INTRAVENOUS | Status: DC

## 2023-12-10 MED ORDER — VANCOMYCIN HCL 1250 MG/250ML IV SOLN
1250.0000 mg | Freq: Once | INTRAVENOUS | Status: AC
  Administered 2023-12-10: 1250 mg via INTRAVENOUS
  Filled 2023-12-10: qty 250

## 2023-12-10 MED ORDER — SODIUM CHLORIDE 0.9 % IV SOLN: INTRAVENOUS | Status: AC

## 2023-12-10 MED ORDER — LORAZEPAM 0.5 MG PO TABS
0.5000 mg | ORAL_TABLET | Freq: Once | ORAL | Status: AC
  Administered 2023-12-10: 0.5 mg via ORAL
  Filled 2023-12-10: qty 1

## 2023-12-10 MED ORDER — ACETAMINOPHEN 325 MG PO TABS
650.0000 mg | ORAL_TABLET | Freq: Four times a day (QID) | ORAL | Status: DC | PRN
Start: 2023-12-10 — End: 2023-12-25
  Administered 2023-12-14 – 2023-12-25 (×10): 650 mg via ORAL
  Filled 2023-12-10 (×11): qty 2

## 2023-12-10 MED ORDER — HEPARIN SOD (PORK) LOCK FLUSH 100 UNIT/ML IV SOLN
INTRAVENOUS | Status: AC
  Filled 2023-12-10: qty 5

## 2023-12-10 NOTE — Progress Notes (Unsigned)
 Symptom Management Clinic  [facility] Department of the [facility]. [facility] [address] [place], [place] [address] [phone] (phone) [phone] (fax)  Patient Care Team: Pcp, No as PCP - General Maurie Rayfield BIRCH, RN as Oncology Nurse Navigator   Name of the patient: Valerie Leonard  [id number]  04-07-1956   Date of visit: 12/10/23  Diagnosis- endometrial cancer  Chief complaint/ Reason for visit- fever  Heme/Onc history:  Oncology History  Endometrial cancer (HCC)  11/11/2023 Initial Diagnosis   Endometrial cancer (HCC)   12/02/2023 Cancer Staging   Staging form: Corpus Uteri - Carcinoma and Carcinosarcoma, AJCC 8th Edition and FIGO 2023 - Clinical stage from 12/02/2023: FIGO Stage IIIC1, calculated as Stage Unknown (cTX, cN1a, cM0) - Signed by Melanee Annah BROCKS, MD on 12/02/2023 Histologic grade (G): G2 Histologic grading system: 3 grade system   12/18/2023 -  Chemotherapy   Patient is on Treatment Plan : UTERINE Pembrolizumab (200), Paclitaxel (175), Carboplatin (5) q21d x 6 cycles / Pembrolizumab (400) q42d       Interval history- patient is 68 year old female with above history of endometrial cancer, planning to start chemo-radiation next week who presents to symptom management clinic for complaints of fever. She was having port placed today by IR who noticed leukocytosis, fever. Per patient, who is poor historian, she's felt increasingly tired over past couple of days but nonfocal. Sister who accompanies patient denies any cough. Was being treated for UTI and she does not think she finished her antibiotics. Patient has not complained of UTI symptoms. She has ongoing vaginal bleeding secondary to her malignancy.   ECOG FS:2 - Symptomatic, <50% confined to bed  Review of systems- Review of Systems  Constitutional:  Positive for chills, fever and malaise/fatigue. Negative for weight loss.  Respiratory:  Negative for  cough, hemoptysis, shortness of breath and wheezing.   Cardiovascular:  Negative for chest pain, palpitations and leg swelling.  Gastrointestinal:  Negative for abdominal pain, blood in stool, constipation, diarrhea, melena, nausea and vomiting.  Genitourinary:  Negative for dysuria, flank pain, frequency, hematuria and urgency.  Musculoskeletal:  Negative for back pain, falls, joint pain and myalgias.  Skin:  Negative for itching and rash.  Neurological:  Negative for dizziness, tingling, sensory change, weakness and headaches.  Endo/Heme/Allergies:  Negative for environmental allergies. Does not bruise/bleed easily.  Psychiatric/Behavioral:  Positive for depression and memory loss. The patient is nervous/anxious.     No Known Allergies  Past Medical History:  Diagnosis Date   Acute delirium    Acute metabolic encephalopathy    Anemia    blood loss anemia   Chronic alcohol abuse    Depression    Endometrial cancer (HCC)    Hypokalemia    Hypomagnesemia    Pressure injury of skin    Severe major depression, single episode, without psychotic features (HCC)    Vaginal bleeding    Past Surgical History:  Procedure Laterality Date   NO PAST SURGERIES      Social History   Socioeconomic History   Marital status: Single    Spouse name: Not on file   Number of children: 0   Years of education: Not on file   Highest education level: Not on file  Occupational History   Not on file  Tobacco Use   Smoking status: Former    Types: Cigarettes, Cigars   Smokeless tobacco: Never  Vaping Use   Vaping status:  Never Used  Substance and Sexual Activity   Alcohol use: Yes    Alcohol/week: 3.0 standard drinks of alcohol    Types: 3 Cans of beer per week    Comment: More than 4 glasses of wine, mixed drinks, beer daily.    Drug use: Not Currently    Comment: former use of marijuana   Sexual activity: Not Currently    Birth control/protection: None  Other Topics Concern   Not on  file  Social History Narrative   Lives by herself    Social Drivers of Health   Financial Resource Strain: Low Risk  (10/13/2023)   Received from [facility]   Overall Financial Resource Strain (CARDIA)    Difficulty of Paying Living Expenses: Not very hard  Food Insecurity: No Food Insecurity (12/02/2023)   Hunger Vital Sign    Worried About Running Out of Food in the Last Year: Never true    Ran Out of Food in the Last Year: Never true  Recent Concern: Food Insecurity - Food Insecurity Present (09/27/2023)   Hunger Vital Sign    Worried About Running Out of Food in the Last Year: Sometimes true    Ran Out of Food in the Last Year: Sometimes true  Transportation Needs: No Transportation Needs (12/02/2023)   PRAPARE - Administrator, Civil Service (Medical): No    Lack of Transportation (Non-Medical): No  Recent Concern: Transportation Needs - Unmet Transportation Needs (09/27/2023)   PRAPARE - Administrator, Civil Service (Medical): Yes    Lack of Transportation (Non-Medical): Yes  Physical Activity: Not on file  Stress: Not on file  Social Connections: Socially Isolated (09/28/2023)   Social Connection and Isolation Panel    Frequency of Communication with Friends and Family: Never    Frequency of Social Gatherings with Friends and Family: Never    Attends Religious Services: Never    Database administrator or Organizations: No    Attends Banker Meetings: Never    Marital Status: Widowed  Intimate Partner Violence: Not At Risk (12/02/2023)   Humiliation, Afraid, Rape, and Kick questionnaire    Fear of Current or Ex-Partner: No    Emotionally Abused: No    Physically Abused: No    Sexually Abused: No    History reviewed. No pertinent family history.   Current Outpatient Medications:    citalopram  (CELEXA ) 20 MG tablet, Take 1 tablet (20 mg total) by mouth daily. (Patient not taking: Reported on 11/04/2023), Disp: 60  tablet, Rfl: 1   dexamethasone  (DECADRON ) 4 MG tablet, Take 2 tablets (8mg ) by mouth daily starting the day after carboplatin for 3 days. Take with food, Disp: 30 tablet, Rfl: 1   escitalopram  (LEXAPRO ) 5 MG tablet, Take 1 tablet every day by oral route in the morning, for depression. (Patient not taking: Reported on 12/02/2023), Disp: , Rfl:    folic acid  (FOLVITE ) 1 MG tablet, Take 1 tablet (1 mg total) by mouth daily. (Patient not taking: Reported on 11/04/2023), Disp: 30 tablet, Rfl: 0   lidocaine -prilocaine  (EMLA ) cream, Apply to affected area once, Disp: 30 g, Rfl: 3   medroxyPROGESTERone  (PROVERA ) 10 MG tablet, Take 1 tablet (10 mg total) by mouth 2 (two) times daily for 7 days. (Patient not taking: Reported on 11/04/2023), Disp: 14 tablet, Rfl: 0   Multiple Vitamin (MULTIVITAMIN WITH MINERALS) TABS tablet, Take 1 tablet by mouth daily., Disp: 30 tablet, Rfl: 1   ondansetron  (ZOFRAN )  8 MG tablet, Take 1 tablet (8 mg total) by mouth every 8 (eight) hours as needed for nausea or vomiting. Start on the third day after carboplatin., Disp: 30 tablet, Rfl: 1   prochlorperazine  (COMPAZINE ) 10 MG tablet, Take 1 tablet (10 mg total) by mouth every 6 (six) hours as needed for nausea or vomiting., Disp: 30 tablet, Rfl: 1   spironolactone (ALDACTONE) 25 MG tablet, Take 12.5 mg by mouth., Disp: , Rfl:  No current facility-administered medications for this visit.  Facility-Administered Medications Ordered in Other Visits:    0.9 %  sodium chloride  infusion, , Intravenous, Continuous, Carim, Charles A, PA-C, Last Rate: 20 mL/hr at 12/10/23 0959, New Bag at 12/10/23 0959   0.9 %  sodium chloride  infusion, , Intravenous, Continuous, Dasie Tinnie MATSU, NP  Physical exam:  Vitals:   12/10/23 1224  BP: (!) 84/66  Pulse: (!) 137  Resp: 18  Temp: (!) 101.1 F (38.4 C)  TempSrc: Oral  SpO2: 96%   Physical Exam Vitals reviewed.  Constitutional:      Appearance: She is not ill-appearing.  Cardiovascular:      Rate and Rhythm: Regular rhythm. Tachycardia present.  Pulmonary:     Effort: No respiratory distress.     Breath sounds: Normal breath sounds. No wheezing.  Abdominal:     General: There is no distension.     Tenderness: There is no abdominal tenderness.     Comments: Wearing depends. Evidence of vaginal bleeding present. Pelvic not performed  Musculoskeletal:        General: No deformity.  Skin:    Coloration: Skin is pale.  Neurological:     Mental Status: She is alert. Mental status is at baseline.  Psychiatric:        Mood and Affect: Mood normal.        Speech: Speech is tangential.        Behavior: Behavior normal.        Cognition and Memory: She exhibits impaired recent memory and impaired remote memory.        Judgment: Judgment is impulsive.        Latest Ref Rng & Units 11/22/2023    3:00 PM  CMP  Glucose 70 - 99 mg/dL 92   BUN 8 - 23 mg/dL 6   Creatinine 9.55 - 8.99 mg/dL 9.36   Sodium 864 - 854 mmol/L 135   Potassium 3.5 - 5.1 mmol/L 3.1   Chloride 98 - 111 mmol/L 99   CO2 22 - 32 mmol/L 22   Calcium  8.9 - 10.3 mg/dL 8.8   Total Protein 6.5 - 8.1 g/dL 7.9   Total Bilirubin 0.0 - 1.2 mg/dL 0.5   Alkaline Phos 38 - 126 U/L 76   AST 15 - 41 U/L 24   ALT 0 - 44 U/L 24       Latest Ref Rng & Units 12/10/2023   11:53 AM  CBC  WBC 4.0 - 10.5 K/uL 25.9   Hemoglobin 12.0 - 15.0 g/dL 9.9   Hematocrit 63.9 - 46.0 % 32.0   Platelets 150 - 400 K/uL 679    DG Chest Portable 1 View Result Date: 11/22/2023 CLINICAL DATA:  Fatigue, hypotension EXAM: PORTABLE CHEST 1 VIEW COMPARISON:  09/28/2023 FINDINGS: Single frontal view of the chest demonstrates an unremarkable cardiac silhouette. No airspace disease, effusion, or pneumothorax. No acute bony abnormalities. IMPRESSION: 1. No acute intrathoracic process. Electronically Signed   By: Ozell Daring M.D.   On: 11/22/2023  15:44   Assessment and plan- Patient is a 68 y.o. female who presents to symptom management clinic  for:   Fever- with hypotension, tachycardia, and leukocytosis. Recent UTI but did not finish antibiotics. Unable to give urine in clinic today so unable to verify source. No respiratory symptoms. 2 liters IV fluids in clinic. BP, HR, and temp improved with fluid resuscitation but I recommend aditional workup and management in hospital. Patient and sister in agreement. Would benefit from hospitalization, IV antibiotics, fluid resuscitation.  Endometrial cancer- planning to start chemo-radiation Anemia- due to chronic bleeding due to endometrial cancer. Hold IV iron  today. Can receive in the hospital as we plan to optimize her counts for starting chemo. Will restart IV iron  upon discharge.    ER- f/u upon discharge- la  Visit Diagnosis 1. Fever, unspecified fever cause   2. Leukocytosis, unspecified type    Patient expressed understanding and was in agreement with this plan. She also understands that She can call clinic at any time with any questions, concerns, or complaints.   Thank you for allowing me to participate in the care of this patient.   Tinnie Dawn, DNP, AGNP-C, AOCNP [facility] [phone]

## 2023-12-10 NOTE — Discharge Instructions (Signed)
 Port insert rescheduled for 9/11 at 08:30 AM. Pt. To arrive at [facility] at 07:30 AM

## 2023-12-10 NOTE — Progress Notes (Signed)
 IV site saline locked. Pt being transported via w/c to ER.

## 2023-12-10 NOTE — Progress Notes (Signed)
 C. Carim, PA in at bedside to speak with pt. And her sister. STAT CBC drawn now and sent to lab.

## 2023-12-10 NOTE — ED Notes (Signed)
 This RN called lab to see status of BC. 2 BC were sent by cancer sent and one by triage

## 2023-12-10 NOTE — Progress Notes (Addendum)
 Pharmacy Antibiotic Note  Valerie Leonard is a 68 y.o. female admitted on 12/10/2023 with sepsis.  Pharmacy has been consulted for Sepsis and Cefepime  dosing. Patient is also ordered metronidazole  500mg  every 12 hours.   Plan: Patient received Vancomycin  1250mg  IV bolus. Start Vancomycin  750 mg IV Q 24 hrs. Goal AUC 400-550. Expected AUC: 451 Css min: 10.6  SCr used: 0.8  Start Cefepime  2g IV every 12 hours  (CrCl 30-60ml/min)  Pharmacy will continue to monitor and adjust dose as needed.    Height: 5' 1 (154.9 cm) Weight: 49 kg (108 lb) IBW/kg (Calculated) : 47.8  Temp (24hrs), Avg:101 F (38.3 C), Min:98.7 F (37.1 C), Max:102.8 F (39.3 C)  Recent Labs  Lab 12/10/23 0957 12/10/23 1153 12/10/23 1520  WBC 23.5* 25.9* 27.1*  CREATININE  --  0.61 0.52  LATICACIDVEN  --   --  2.2*    Estimated Creatinine Clearance: 51.5 mL/min (by C-G formula based on SCr of 0.52 mg/dL).    No Known Allergies  Antimicrobials this admission: 9/4 Zosyn  x 1 9/4 vancomycin   >>  9/4 Cefepime  >>   Dose adjustments this admission: N/A  Microbiology results: 9/4 BCx: Pending  9/4 UCx: pending   Thank you for allowing pharmacy to be a part of this patient's care.  Valerie Leonard, PharmD, BCPS Clinical Pharmacist 12/10/2023 7:15 PM

## 2023-12-10 NOTE — ED Triage Notes (Signed)
 Patient was in outpatient surgery today to have port placed, was febrile and sent to cancer center. At cancer center patient had elevated white count, fever and low BP. Patient states she feels the same as she always has. Cancer center started IV and gave 2L IVF.

## 2023-12-10 NOTE — Procedures (Signed)
 Procedure cancelled order used for wasted supplies

## 2023-12-10 NOTE — Progress Notes (Signed)
 1447-rn and 2 cmas attempted to toilet patient for urine culture. Unable to void after a liter and half of fluids. Diaper dry other than noted dried blood clot on diaper. Patient did pass a blood clot in bedpan.  Rechecked bp 98/50; HR 91. Temp 101.2 tympanic

## 2023-12-10 NOTE — Progress Notes (Signed)
 Port cancelled for today secondary to lab results. PA Carlin Leep in speaking with pt. And sister. Dr. Melanee consulted for plan for today from here.

## 2023-12-10 NOTE — H&P (Signed)
 History and Physical    Patient: Valerie Leonard FMW:[id number] DOB: 1956-01-29 DOA: 12/10/2023 DOS: the patient was seen and examined on 12/10/2023 PCP: Pcp, No  Patient coming from: Home  Chief Complaint: No chief complaint on file.  HPI: Valerie Leonard is a 68 y.o. female with medical history significant of endometrial cancer not yet on chemotherapy, depressive disorder, history of alcoholism, who was at the cancer center today to get her access for initiating chemotherapy.  She was noted to have fever and concern for sepsis was raised.  She was subsequently sent over to to the ER for evaluation.  She had recent UTI for which she was treated with cefdinir .  The urine culture was nondiagnostic with multiple species.  Today however patient noted to have white count of 26,000.Lactic acid 2.2.  She had a temperature 102.2.  Also hypotensive on arrival with systolic of 70 and diastolic 48.  Patient appears to have severe sepsis.  The source is not clear but suspicion for possible bacteremia.  Patient is therefore being admitted with sepsis of unknown source.  She is confused.  Appears to have acute metabolic encephalopathy from the incident.  Review of Systems: As mentioned in the history of present illness. All other systems reviewed and are negative. Past Medical History:  Diagnosis Date   Acute delirium    Acute metabolic encephalopathy    Anemia    blood loss anemia   Chronic alcohol abuse    Depression    Endometrial cancer (HCC)    Hypokalemia    Hypomagnesemia    Pressure injury of skin    Severe major depression, single episode, without psychotic features (HCC)    Vaginal bleeding    Past Surgical History:  Procedure Laterality Date   IR PATIENT EVAL TECH 0-60 MINS  12/10/2023   NO PAST SURGERIES     Social History:  reports that she has quit smoking. Her smoking use included cigarettes and cigars. She has never used smokeless tobacco. She reports current alcohol use of about 3.0  standard drinks of alcohol per week. She reports that she does not currently use drugs.  No Known Allergies  History reviewed. No pertinent family history.  Prior to Admission medications   Medication Sig Start Date End Date Taking? Authorizing Provider  citalopram  (CELEXA ) 20 MG tablet Take 1 tablet (20 mg total) by mouth daily. Patient not taking: Reported on 11/04/2023 07/28/20   Patel, Sona, MD  dexamethasone  (DECADRON ) 4 MG tablet Take 2 tablets (8mg ) by mouth daily starting the day after carboplatin for 3 days. Take with food 12/02/23   Melanee Annah JAYSON, MD  escitalopram  (LEXAPRO ) 5 MG tablet Take 1 tablet every day by oral route in the morning, for depression. Patient not taking: Reported on 12/02/2023 11/24/23   [provider]  folic acid  (FOLVITE ) 1 MG tablet Take 1 tablet (1 mg total) by mouth daily. Patient not taking: Reported on 11/04/2023 07/28/20   Patel, Sona, MD  lidocaine -prilocaine  (EMLA ) cream Apply to affected area once 12/02/23   Melanee Annah JAYSON, MD  medroxyPROGESTERone  (PROVERA ) 10 MG tablet Take 1 tablet (10 mg total) by mouth 2 (two) times daily for 7 days. Patient not taking: Reported on 11/04/2023 09/29/23 10/06/23  Dorinda Drue DASEN, MD  Multiple Vitamin (MULTIVITAMIN WITH MINERALS) TABS tablet Take 1 tablet by mouth daily. 07/28/20   Patel, Sona, MD  ondansetron  (ZOFRAN ) 8 MG tablet Take 1 tablet (8 mg total) by mouth every 8 (eight) hours as  needed for nausea or vomiting. Start on the third day after carboplatin. 12/02/23   Melanee Annah BROCKS, MD  prochlorperazine  (COMPAZINE ) 10 MG tablet Take 1 tablet (10 mg total) by mouth every 6 (six) hours as needed for nausea or vomiting. 12/02/23   Melanee Annah BROCKS, MD  spironolactone (ALDACTONE) 25 MG tablet Take 12.5 mg by mouth. 11/17/23 11/16/24  [provider]    Physical Exam: Vitals:   12/10/23 1630 12/10/23 1730 12/10/23 1800 12/10/23 1900  BP: (!) 86/44 (!) 70/48 (!) 81/52 (!) 88/48  Pulse: 96 85 86 75  Resp:    12   Temp:      TempSrc:      SpO2: 99% 99% 100% 100%  Weight:      Height:       Constitutional: Chronically ill looking NAD, calm, comfortable Eyes: PERRL, lids and conjunctivae normal ENMT: Mucous membranes are moist. Posterior pharynx clear of any exudate or lesions.Normal dentition.  Neck: normal, supple, no masses, no thyromegaly Respiratory: clear to auscultation bilaterally, no wheezing, no crackles. Normal respiratory effort. No accessory muscle use.  Cardiovascular: Sinus tachycardia, no murmurs / rubs / gallops. No extremity edema. 2+ pedal pulses. No carotid bruits.  Abdomen: Distended, mild tenderness no masses palpated. No hepatosplenomegaly. Bowel sounds positive.  Musculoskeletal: Good range of motion, no joint swelling or tenderness, Skin: no rashes, lesions, ulcers. No induration Neurologic: CN 2-12 grossly intact. Sensation intact, DTR normal. Strength 5/5 in all 4.  Psychiatric: Confused but alert, normal mood  Data Reviewed:  Temperature 102.8, blood pressure 70/48, pulse 137, white count 27.1, hemoglobin 8.7 platelets 611.Urinalysis negative.  Acute viral screen negative glucose 143.  Chest x-ray showed no acute findings.  Assessment and Plan:  #1 severe sepsis: Patient has sepsis with hypotension.  No obvious source of her sepsis.  Suspected to be as a result of bacteremia.  She has no status chemotherapy but still appears to have immunocompromise status.  Will admit the patient.  Get blood cultures.  Aggressive fluid resuscitation.  Monitor blood pressure.  Empiric broad-spectrum antibiotics until cultures are back.  #2 endometrial cancer: Will resume care with oncology after treatment.  Patient being prepped for chemotherapy.  #3 anemia of chronic disease: Secondary to endometrial bleed.  Continue monitoring.  Hemoglobin 8.7.  No transfusion at this point.  #4 history of alcohol abuse: Remote.  Does not appear to have active drink.  Continue to monitor in the  hospital.  #5 acute metabolic encephalopathy: Most likely as a result of sepsis.  Continue to monitor.  #6 thrombocytosis: Most likely reactive.  Continue to monitor.    Advance Care Planning:   Code Status: Prior full code  Consults: None in the hospital  Family Communication: No family at bedside  Severity of Illness: The appropriate patient status for this patient is INPATIENT. Inpatient status is judged to be reasonable and necessary in order to provide the required intensity of service to ensure the patient's safety. The patient's presenting symptoms, physical exam findings, and initial radiographic and laboratory data in the context of their chronic comorbidities is felt to place them at high risk for further clinical deterioration. Furthermore, it is not anticipated that the patient will be medically stable for discharge from the hospital within 2 midnights of admission.   * I certify that at the point of admission it is my clinical judgment that the patient will require inpatient hospital care spanning beyond 2 midnights from the point of admission due to high  intensity of service, high risk for further deterioration and high frequency of surveillance required.*  AuthorBETHA SIM KNOLL, MD 12/10/2023 7:07 PM  For on call review [email or web address].

## 2023-12-10 NOTE — ED Provider Notes (Signed)
 [facility] Provider Note    Event Date/Time   First MD Initiated Contact with Patient 12/10/23 1600     (approximate)   History   No chief complaint on file.   HPI  Valerie Leonard is a 68 y.o. female who presents to the ED for evaluation of No chief complaint on file.   I reviewed an oncology clinic visit from 8/27.  Recently diagnosed endometrial carcinoma, postmenopausal bleeding starting in June.  Has required transfusions. Otherwise history of depression, alcohol abuse  Diagnosed with a UTI here in the ED on 8/17, discharged with cefdinir , urine culture with multiple species.   Patient presents to the ED from neighboring cancer center for evaluation of fever and concern for sepsis.  She was supposed to have an outpatient Port-A-Cath placed this morning to help initiate chemotherapy but was noted to be febrile and was directed to the cancer center.  They drew blood work with normal CMP, CBC with leukocytosis of 26K, dropped to cultures, unable to get a urine sample.   Here in the ED, patient reports generalized malaise, feeling bad.  She reports finishing most of the cefdinir  from her recent UTI.  Denies any focal symptoms, continued small-volume vaginal bleeding.  Physical Exam   Triage Vital Signs: ED Triage Vitals  Encounter Vitals Group     BP 12/10/23 1516 (!) 81/41     Girls Systolic BP Percentile --      Girls Diastolic BP Percentile --      Boys Systolic BP Percentile --      Boys Diastolic BP Percentile --      Pulse Rate 12/10/23 1516 (!) 114     Resp 12/10/23 1516 20     Temp 12/10/23 1516 (!) 102.2 F (39 C)     Temp Source 12/10/23 1516 Oral     SpO2 12/10/23 1516 97 %     Weight 12/10/23 1517 108 lb (49 kg)     Height 12/10/23 1517 5' 1 (1.549 m)     Head Circumference --      Peak Flow --      Pain Score 12/10/23 1517 0     Pain Loc --      Pain Education --      Exclude from Growth Chart --     Most recent vital  signs: Vitals:   12/10/23 1730 12/10/23 1800  BP: (!) 70/48 (!) 81/52  Pulse: 85 86  Resp:    Temp:    SpO2: 99% 100%    General: Awake, no distress.  Warm to the touch, generally well-appearing, making jokes CV:  Good peripheral perfusion.  Tachycardic and regular Resp:  Normal effort.  Abd:  No distention.  Mild suprapubic tenderness but otherwise nontender abdomen without peritoneal features. MSK:  No deformity noted.  No rash appreciated Neuro:  No focal deficits appreciated. Other:     ED Results / Procedures / Treatments   Labs (all labs ordered are listed, but only abnormal results are displayed) Labs Reviewed  LACTIC ACID, PLASMA - Abnormal; Notable for the following components:      Result Value   Lactic Acid, Venous 2.2 (*)    All other components within normal limits  COMPREHENSIVE METABOLIC PANEL WITH GFR - Abnormal; Notable for the following components:   Glucose, Bld 143 (*)    Calcium  7.9 (*)    Albumin 2.3 (*)    All other components within normal limits  CBC WITH  DIFFERENTIAL/PLATELET - Abnormal; Notable for the following components:   WBC 27.1 (*)    RBC 3.37 (*)    Hemoglobin 8.7 (*)    HCT 28.2 (*)    MCH 25.8 (*)    RDW 17.8 (*)    Platelets 611 (*)    Neutro Abs 23.3 (*)    Monocytes Absolute 1.6 (*)    Abs Immature Granulocytes 0.21 (*)    All other components within normal limits  URINALYSIS, W/ REFLEX TO CULTURE (INFECTION SUSPECTED) - Abnormal; Notable for the following components:   Color, Urine AMBER (*)    APPearance CLOUDY (*)    Ketones, ur 5 (*)    Protein, ur 30 (*)    Bacteria, UA RARE (*)    All other components within normal limits  RESP PANEL BY RT-PCR (RSV, FLU A&B, COVID)  RVPGX2  CULTURE, BLOOD (ROUTINE X 2)  CULTURE, BLOOD (ROUTINE X 2)  URINE CULTURE  PROCALCITONIN  LACTIC ACID, PLASMA    EKG Sinus rhythm with a rate of 107 bpm.  Normal axis and intervals.  Signs of acute ischemia.  RADIOLOGY CXR interpreted by  me without evidence of acute cardiopulmonary pathology.  Official radiology report(s): DG Chest Port 1 View Result Date: 12/10/2023 CLINICAL DATA:  Infection. EXAM: PORTABLE CHEST 1 VIEW COMPARISON:  11/22/2023. FINDINGS: The heart size and mediastinal contours are unchanged. No focal consolidation, pleural effusion, or pneumothorax. No acute osseous abnormality. IMPRESSION: No acute cardiopulmonary findings. Electronically Signed   By: Harrietta Sherry M.D.   On: 12/10/2023 16:20   IR PATIENT EVAL TECH 0-60 MINS Result Date: 12/10/2023 Delight Bebe SAILOR     12/10/2023 12:32 PM Procedure cancelled order used for wasted supplies   PROCEDURES and INTERVENTIONS:  .1-3 Lead EKG Interpretation  Performed by: Claudene Rover, MD Authorized by: Claudene Rover, MD     Interpretation: abnormal     ECG rate:  106   ECG rate assessment: tachycardic     Rhythm: sinus tachycardia     Ectopy: none     Conduction: normal   .Critical Care  Performed by: Claudene Rover, MD Authorized by: Claudene Rover, MD   Critical care provider statement:    Critical care time (minutes):  30   Critical care time was exclusive of:  Separately billable procedures and treating other patients   Critical care was necessary to treat or prevent imminent or life-threatening deterioration of the following conditions:  Sepsis and shock   Critical care was time spent personally by me on the following activities:  Development of treatment plan with patient or surrogate, discussions with consultants, evaluation of patient's response to treatment, examination of patient, ordering and review of laboratory studies, ordering and review of radiographic studies, ordering and performing treatments and interventions, pulse oximetry, re-evaluation of patient's condition and review of old charts   Medications  vancomycin  (VANCOREADY) IVPB 1250 mg/250 mL (1,250 mg Intravenous New Bag/Given 12/10/23 1816)  0.9 %  sodium chloride  infusion (has no  administration in time range)  acetaminophen  (TYLENOL ) tablet 1,000 mg (1,000 mg Oral Given 12/10/23 1526)  lactated ringers  bolus 1,000 mL (1,000 mLs Intravenous New Bag/Given 12/10/23 1625)  piperacillin -tazobactam (ZOSYN ) IVPB 3.375 g (0 g Intravenous Stopped 12/10/23 1848)  sodium chloride  0.9 % bolus 1,000 mL (1,000 mLs Intravenous New Bag/Given 12/10/23 1823)  sodium chloride  0.9 % bolus 1,000 mL (1,000 mLs Intravenous New Bag/Given 12/10/23 1737)  LORazepam  (ATIVAN ) tablet 0.5 mg (0.5 mg Oral Given 12/10/23 1740)  IMPRESSION / MDM / ASSESSMENT AND PLAN / ED COURSE  I reviewed the triage vital signs and the nursing notes.  Differential diagnosis includes, but is not limited to, viral syndrome such as COVID-19, bacteremia, sepsis  {Patient presents with symptoms of an acute illness or injury that is potentially life-threatening.  Endometrial cancer patient presents to the ED with signs of sepsis of an unknown source requiring medical admission.  Febrile, tachycardic with soft pressures but look systemically well, making jokes.  Improving BP with fluid resuscitation.  Cultures drawn at the cancer center, mildly elevated lactic acid, elevated procalcitonin, leukocytosis without metabolic derangements.  Negative viral/COVID swab.  No clear etiology on exam, urine is clear as well as CXR.  Provide broad-spectrum antibiotics and consult with medicine for admission.  Clinical Course as of 12/10/23 1902  Thu Dec 10, 2023  1802 Reassessed and discussed plan of care [DS]  1858 I consult medicine who agrees to admit [DS]    Clinical Course User Index [DS] Claudene Rover, MD     FINAL CLINICAL IMPRESSION(S) / ED DIAGNOSES   Final diagnoses:  Severe sepsis [facility])     Rx / DC Orders   ED Discharge Orders     None        Note:  This document was prepared using Dragon voice recognition software and may include unintentional dictation errors.   Claudene Rover, MD 12/10/23 RETHA

## 2023-12-10 NOTE — Progress Notes (Signed)
  IR BRIEF PROGRESS NOTE:  Patient presented to [facility] this am for scheduled port placement in anticipation of initiating chemotherapy on 9/12. On arrival, patient's oral temp was 101.5, and stat CBC returned with WBC at 23.5. Patient was recently seen in ED for UTI, and initiated on oral antibiotics. This may be the source of infection. Unfortunately, IR will need to reschedule port placement until patient is treated and her WBC normalized. IR reached out to Dr. Melanee with offer for PICC placement today. Dr. Melanee advised against port and PICC placement both, and requested patient be discharged from [facility] and transferred to the Cancer Center for immediate evaluation. Patient has been tentatively re-scheduled for port placement on 9/11 @ 8:30, pending being cleared from current infection. Patient was discharged, as requested, and transported to [facility].    Electronically Signed: Carlin DELENA Griffon, PA-C 12/10/2023, 11:10 AM

## 2023-12-10 NOTE — Sepsis Progress Note (Signed)
 Sepsis protocol monitored by eLink

## 2023-12-10 NOTE — Progress Notes (Signed)
 Bp after 1 liter of fluids 88/39. V/o Lauren Allen-NP-patient needs an additional liter of IV fluids.

## 2023-12-10 NOTE — Progress Notes (Signed)
 PA back in to see pt. And sister. Plan is to send pt. Over to Cancer center after being Dc'd from here.

## 2023-12-10 NOTE — Telephone Encounter (Signed)
 Spoke with Dr. Melanee regarding elevated white count and fever. She would like to have patient seen and evaluated in [facility]. Appointments were arranged and IR will have patient come to the cancer center once discharged from their service. Infusion to evaluate for peripheral chemo once she arrives.

## 2023-12-10 NOTE — Consult Note (Signed)
 CODE SEPSIS - PHARMACY COMMUNICATION  **Broad Spectrum Antibiotics should be administered within 1 hour of Sepsis diagnosis**  Time Code Sepsis Called/Page Received: 1735  Antibiotics Ordered: Zosyn  3.375 g x1, Vancomycin  1250 mg x1  Time of 1st antibiotic administration: 1736  Additional action taken by pharmacy: none   Annabella LOISE Banks ,PharmD Clinical Pharmacist  12/10/2023  5:39 PM

## 2023-12-11 ENCOUNTER — Encounter: Payer: Self-pay | Admitting: Internal Medicine

## 2023-12-11 ENCOUNTER — Encounter: Payer: Self-pay | Admitting: Hospice and Palliative Medicine

## 2023-12-11 ENCOUNTER — Encounter: Payer: Self-pay | Admitting: Oncology

## 2023-12-11 DIAGNOSIS — A419 Sepsis, unspecified organism: Secondary | ICD-10-CM | POA: Diagnosis not present

## 2023-12-11 DIAGNOSIS — R6520 Severe sepsis without septic shock: Secondary | ICD-10-CM | POA: Diagnosis not present

## 2023-12-11 LAB — COMPREHENSIVE METABOLIC PANEL WITH GFR
ALT: 9 U/L (ref 0–44)
AST: 16 U/L (ref 15–41)
Albumin: 1.7 g/dL — ABNORMAL LOW (ref 3.5–5.0)
Alkaline Phosphatase: 45 U/L (ref 38–126)
Anion gap: 3 — ABNORMAL LOW (ref 5–15)
BUN: 9 mg/dL (ref 8–23)
CO2: 24 mmol/L (ref 22–32)
Calcium: 7.4 mg/dL — ABNORMAL LOW (ref 8.9–10.3)
Chloride: 110 mmol/L (ref 98–111)
Creatinine, Ser: 0.31 mg/dL — ABNORMAL LOW (ref 0.44–1.00)
GFR, Estimated: >60 mL/min (ref >60)
Glucose, Bld: 89 mg/dL (ref 70–99)
Potassium: 3.5 mmol/L (ref 3.5–5.1)
Sodium: 137 mmol/L (ref 135–145)
Total Bilirubin: 0.5 mg/dL (ref 0.0–1.2)
Total Protein: 5.4 g/dL — ABNORMAL LOW (ref 6.5–8.1)

## 2023-12-11 LAB — CBC
HCT: 22.3 % — ABNORMAL LOW (ref 36.0–46.0)
Hemoglobin: 6.9 g/dL — ABNORMAL LOW (ref 12.0–15.0)
MCH: 26.1 pg (ref 26.0–34.0)
MCHC: 30.9 g/dL (ref 30.0–36.0)
MCV: 84.5 fL (ref 80.0–100.0)
Platelets: 467 K/uL — ABNORMAL HIGH (ref 150–400)
RBC: 2.64 MIL/uL — ABNORMAL LOW (ref 3.87–5.11)
RDW: 17.9 % — ABNORMAL HIGH (ref 11.5–15.5)
WBC: 19.7 K/uL — ABNORMAL HIGH (ref 4.0–10.5)
nRBC: 0 % (ref 0.0–0.2)

## 2023-12-11 LAB — URINE CULTURE: Culture: NO GROWTH

## 2023-12-11 LAB — HEMOGLOBIN AND HEMATOCRIT, BLOOD
HCT: 28.8 % — ABNORMAL LOW (ref 36.0–46.0)
Hemoglobin: 9.2 g/dL — ABNORMAL LOW (ref 12.0–15.0)

## 2023-12-11 LAB — PROTIME-INR
INR: 1.4 — ABNORMAL HIGH (ref 0.8–1.2)
Prothrombin Time: 17.5 s — ABNORMAL HIGH (ref 11.4–15.2)

## 2023-12-11 LAB — PREPARE RBC (CROSSMATCH)

## 2023-12-11 LAB — CORTISOL-AM, BLOOD: Cortisol - AM: 10.7 ug/dL (ref 6.7–22.6)

## 2023-12-11 MED ORDER — ENSURE PLUS HIGH PROTEIN PO LIQD
237.0000 mL | Freq: Two times a day (BID) | ORAL | Status: DC
  Administered 2023-12-11 – 2023-12-16 (×5): 237 mL via ORAL

## 2023-12-11 MED ORDER — SODIUM CHLORIDE 0.9% IV SOLUTION: Freq: Once | INTRAVENOUS | Status: AC

## 2023-12-11 MED ORDER — ESCITALOPRAM OXALATE 10 MG PO TABS
5.0000 mg | ORAL_TABLET | Freq: Every day | ORAL | Status: DC
  Administered 2023-12-11 – 2023-12-25 (×15): 5 mg via ORAL
  Filled 2023-12-11 (×15): qty 1

## 2023-12-11 MED ORDER — ESCITALOPRAM OXALATE 10 MG PO TABS: 5.0000 mg | ORAL_TABLET | Freq: Every day | ORAL | Status: DC

## 2023-12-11 MED ORDER — ALPRAZOLAM 0.25 MG PO TABS
0.2500 mg | ORAL_TABLET | Freq: Two times a day (BID) | ORAL | Status: DC | PRN
  Administered 2023-12-11 – 2023-12-24 (×10): 0.25 mg via ORAL
  Filled 2023-12-11 (×10): qty 1

## 2023-12-11 MED ORDER — DIPHENHYDRAMINE HCL 25 MG PO CAPS
50.0000 mg | ORAL_CAPSULE | Freq: Every day | ORAL | Status: DC
  Administered 2023-12-11 – 2023-12-24 (×14): 50 mg via ORAL
  Filled 2023-12-11 (×14): qty 2

## 2023-12-11 NOTE — Discharge Instructions (Signed)
 Some PCP options in [place] area- not a comprehensive list  [facility]- [phone] [facility]- [phone] [facility]- [phone] [facility]- [phone] [facility]- [phone] Nichole Molly- [phone]  or [facility] Physician Referral Line [phone]

## 2023-12-11 NOTE — Progress Notes (Signed)
 PROGRESS NOTE    Valerie Leonard  FMW:[id number] DOB: [birth year] DOA: 12/10/2023 PCP: Pcp, No    Assessment & Plan:   Principal Problem:   Sepsis (HCC) Active Problems:   Chronic alcohol abuse   Severe major depression, single episode, without psychotic features (HCC)   Anemia   Endometrial cancer (HCC)  Assessment and Plan: Severe sepsis: see how pt met severe sepsis criteria as per Dr. Sherrilee note.  No obvious source of her sepsis. Blood cxs pending. Urine cx pending. CXR was neg. RSV, influenza, & COVID19 are all neg    Endometrial cancer: Will resume care with oncology after treatment.  Patient being prepped for chemotherapy.   ACD: secondary to endometrial bleed. Will transfuse 1 unit of pRBCs today. Repeat H&H ordered    Hx of alcohol abuse: remote hx. Will continue to monitor     Acute metabolic encephalopathy: likely secondary to possible infection. Mental status has improved.    Thrombocytosis: likely reactive. Will continue to monitor       DVT prophylaxis: SCDs Code Status: full  Family Communication:  Disposition Plan: depends on PT/OT recs (not consulted yet)   Level of care: Telemetry Medical  Status is: Inpatient Remains inpatient appropriate because: severity of illness, requiring 1 unit of pRBCs today     Consultants:    Procedures:   Antimicrobials: cefepime , flagyl , vanco    Subjective: Pt c/o malaise  Objective: Vitals:   12/10/23 2215 12/11/23 0049 12/11/23 0436 12/11/23 0739  BP: (!) 80/62 (!) 87/53 (!) 94/53 (!) 98/55  Pulse: 66 63 99 76  Resp:  16 16 14   Temp:  97.6 F (36.4 C) 98.3 F (36.8 C) 98.2 F (36.8 C)  TempSrc:      SpO2:  99% 91% 100%  Weight:      Height:        Intake/Output Summary (Last 24 hours) at 12/11/2023 0921 Last data filed at 12/11/2023 0304 Gross per 24 hour  Intake 3317.5 ml  Output --  Net 3317.5 ml   Filed Weights   12/10/23 1517  Weight: 49 kg    Examination:  General exam: Appears  calm and comfortable  Respiratory system: Clear to auscultation. Respiratory effort normal. Cardiovascular system: S1 & S2+. No  rubs, gallops or clicks.  Gastrointestinal system: Abdomen is nondistended, soft and nontender. Hypoactive bowel sounds heard. Central nervous system: Alert and oriented.Moves all extremities Psychiatry: Judgement and insight appear normal. Mood & affect appropriate.     Data Reviewed: I have personally reviewed following labs and imaging studies  CBC: Recent Labs  Lab 12/10/23 0957 12/10/23 1153 12/10/23 1520 12/11/23 0445  WBC 23.5* 25.9* 27.1* 19.7*  NEUTROABS  --  23.0* 23.3*  --   HGB 9.8* 9.9* 8.7* 6.9*  HCT 32.1* 32.0* 28.2* 22.3*  MCV 83.4 82.9 83.7 84.5  PLT 625* 679* 611* 467*   Basic Metabolic Panel: Recent Labs  Lab 12/10/23 1153 12/10/23 1520 12/11/23 0445  NA 135 135 137  K 3.9 3.6 3.5  CL 99 104 110  CO2 23 22 24   GLUCOSE 101* 143* 89  BUN 14 12 9   CREATININE 0.61 0.52 0.31*  CALCIUM  8.9 7.9* 7.4*   GFR: Estimated Creatinine Clearance: 51.5 mL/min (A) (by C-G formula based on SCr of 0.31 mg/dL (L)). Liver Function Tests: Recent Labs  Lab 12/10/23 1153 12/10/23 1520 12/11/23 0445  AST 26 25 16   ALT 13 11 9   ALKPHOS 71 63 45  BILITOT 0.6 0.5 0.5  PROT 8.0 7.3 5.4*  ALBUMIN 2.7* 2.3* 1.7*   No results for input(s): LIPASE, AMYLASE in the last 168 hours. No results for input(s): AMMONIA in the last 168 hours. Coagulation Profile: Recent Labs  Lab 12/11/23 0445  INR 1.4*   Cardiac Enzymes: No results for input(s): CKTOTAL, CKMB, CKMBINDEX, TROPONINI in the last 168 hours. BNP (last 3 results) No results for input(s): PROBNP in the last 8760 hours. HbA1C: No results for input(s): HGBA1C in the last 72 hours. CBG: No results for input(s): GLUCAP in the last 168 hours. Lipid Profile: No results for input(s): CHOL, HDL, LDLCALC, TRIG, CHOLHDL, LDLDIRECT in the last 72  hours. Thyroid  Function Tests: No results for input(s): TSH, T4TOTAL, FREET4, T3FREE, THYROIDAB in the last 72 hours. Anemia Panel: No results for input(s): VITAMINB12, FOLATE, FERRITIN, TIBC, IRON , RETICCTPCT in the last 72 hours. Sepsis Labs: Recent Labs  Lab 12/10/23 1520 12/10/23 2040  PROCALCITON 5.76  --   LATICACIDVEN 2.2* 1.3    Recent Results (from the past 240 hours)  Blood culture (routine x 2)     Status: None (Preliminary result)   Collection Time: 12/10/23  3:20 PM   Specimen: BLOOD  Result Value Ref Range Status   Specimen Description BLOOD RIGHT ANTECUBITAL  Final   Special Requests   Final    BOTTLES DRAWN AEROBIC AND ANAEROBIC Blood Culture adequate volume   Culture   Final    NO GROWTH < 12 HOURS Performed at [facility], [address] [place], [place] [address]    Report Status PENDING  Incomplete  Resp panel by RT-PCR (RSV, Flu A&B, Covid) Anterior Nasal Swab     Status: None   Collection Time: 12/10/23  4:25 PM   Specimen: Anterior Nasal Swab  Result Value Ref Range Status   SARS Coronavirus 2 by RT PCR NEGATIVE NEGATIVE Final    Comment: (NOTE) SARS-CoV-2 target nucleic acids are NOT DETECTED.  The SARS-CoV-2 RNA is generally detectable in upper respiratory specimens during the acute phase of infection. The lowest concentration of SARS-CoV-2 viral copies this assay can detect is 138 copies/mL. A negative result does not preclude SARS-Cov-2 infection and should not be used as the sole basis for treatment or other patient management decisions. A negative result may occur with  improper specimen collection/handling, submission of specimen other than nasopharyngeal swab, presence of viral mutation(s) within the areas targeted by this assay, and inadequate number of viral copies(<138 copies/mL). A negative result must be combined with clinical observations, patient history, and epidemiological information. The expected  result is Negative.  Fact Sheet for Patients:  BloggerCourse.com  Fact Sheet for Healthcare Providers:  SeriousBroker.it  This test is no t yet approved or cleared by the [place]  FDA and  has been authorized for detection and/or diagnosis of SARS-CoV-2 by FDA under an Emergency Use Authorization (EUA). This EUA will remain  in effect (meaning this test can be used) for the duration of the COVID-19 declaration under Section 564(b)(1) of the Act, 21 U.S.C.section 360bbb-3(b)(1), unless the authorization is terminated  or revoked sooner.       Influenza A by PCR NEGATIVE NEGATIVE Final   Influenza B by PCR NEGATIVE NEGATIVE Final    Comment: (NOTE) The Xpert Xpress SARS-CoV-2/FLU/RSV plus assay is intended as an aid in the diagnosis of influenza from Nasopharyngeal swab specimens and should not be used as a sole basis for treatment. Nasal washings and aspirates are unacceptable for Xpert Xpress SARS-CoV-2/FLU/RSV testing.  Fact Sheet for Patients: BloggerCourse.com  Fact Sheet for Healthcare Providers: SeriousBroker.it  This test is not yet approved or cleared by the [place]  FDA and has been authorized for detection and/or diagnosis of SARS-CoV-2 by FDA under an Emergency Use Authorization (EUA). This EUA will remain in effect (meaning this test can be used) for the duration of the COVID-19 declaration under Section 564(b)(1) of the Act, 21 U.S.C. section 360bbb-3(b)(1), unless the authorization is terminated or revoked.     Resp Syncytial Virus by PCR NEGATIVE NEGATIVE Final    Comment: (NOTE) Fact Sheet for Patients: BloggerCourse.com  Fact Sheet for Healthcare Providers: SeriousBroker.it  This test is not yet approved or cleared by the [place]  FDA and has been authorized for detection and/or diagnosis of  SARS-CoV-2 by FDA under an Emergency Use Authorization (EUA). This EUA will remain in effect (meaning this test can be used) for the duration of the COVID-19 declaration under Section 564(b)(1) of the Act, 21 U.S.C. section 360bbb-3(b)(1), unless the authorization is terminated or revoked.  Performed at [facility], [address] [place], [place] [address]   Blood culture (routine x 2)     Status: None (Preliminary result)   Collection Time: 12/10/23  8:41 PM   Specimen: BLOOD  Result Value Ref Range Status   Specimen Description BLOOD BLOOD LEFT HAND [facility]  Final   Special Requests   Final    BOTTLES DRAWN AEROBIC AND ANAEROBIC Blood Culture adequate volume   Culture   Final    NO GROWTH < 12 HOURS Performed at [facility], [address], [place], [place] [address]    Report Status PENDING  Incomplete         Radiology Studies: DG Chest Port 1 View Result Date: 12/10/2023 CLINICAL DATA:  Infection. EXAM: PORTABLE CHEST 1 VIEW COMPARISON:  11/22/2023. FINDINGS: The heart size and mediastinal contours are unchanged. No focal consolidation, pleural effusion, or pneumothorax. No acute osseous abnormality. IMPRESSION: No acute cardiopulmonary findings. Electronically Signed   By: Harrietta Sherry M.D.   On: 12/10/2023 16:20   IR PATIENT EVAL TECH 0-60 MINS Result Date: 12/10/2023 Delight Bebe SAILOR     12/10/2023 12:32 PM Procedure cancelled order used for wasted supplies       Scheduled Meds:  feeding supplement  237 mL Oral BID BM   Continuous Infusions:  sodium chloride  150 mL/hr at 12/11/23 9362   ceFEPime  (MAXIPIME ) IV Stopped (12/10/23 2253)   metronidazole  Stopped (12/11/23 0141)   vancomycin        LOS: 1 day      Anthony CHRISTELLA Pouch, MD [facility] Pager [phone]  If 7PM-7AM, please contact night-coverage [email or web address] 12/11/2023, 9:21 AM

## 2023-12-11 NOTE — Plan of Care (Signed)

## 2023-12-11 NOTE — Progress Notes (Addendum)
 Pharmacy Antibiotic Note  Valerie Leonard is a 68 y.o. female admitted on 12/10/2023 with sepsis.  Pharmacy has been consulted for Sepsis and Cefepime  dosing. Patient is also ordered metronidazole  500mg  every 12 hours.   Plan: Patient received Vancomycin  1250mg  IV bolus. Give Vancomycin  750 mg IV Q 24 hrs. Goal AUC 400-550. Expected AUC: 451 Css min: 10.6  SCr used: 0.8   Pharmacy will continue to monitor and adjust dose as needed.    Height: 5' 1 (154.9 cm) Weight: 49 kg (108 lb) IBW/kg (Calculated) : 47.8  Temp (24hrs), Avg:98.3 F (36.8 C), Min:97.6 F (36.4 C), Max:98.9 F (37.2 C)  Recent Labs  Lab 12/10/23 0957 12/10/23 1153 12/10/23 1520 12/10/23 2040 12/11/23 0445  WBC 23.5* 25.9* 27.1*  --  19.7*  CREATININE  --  0.61 0.52  --  0.31*  LATICACIDVEN  --   --  2.2* 1.3  --     Estimated Creatinine Clearance: 51.5 mL/min (A) (by C-G formula based on SCr of 0.31 mg/dL (L)).    No Known Allergies  Antimicrobials this admission: 9/4 Zosyn  x 1 9/4 vancomycin   >>  9/4 Cefepime  >>  9/4 metronidazole >> 14 doses, 9/11  Dose adjustments this admission: N/A  Microbiology results: 9/4 BCx: Pending  9/4 UCx: pending   Thank you for allowing pharmacy to be a part of this patient's care.  Leonor JAYSON Argyle, PharmD PGY1 12/11/2023 4:22 PM

## 2023-12-11 NOTE — TOC Initial Note (Signed)
 Transition of Care [facility]) - Initial/Assessment Note    Patient Details  Name: Valerie Leonard MRN: [id number] Date of Birth: [birth year]  Transition of [facility] CM/SW Contact:    Dalia GORMAN Fuse, RN Phone Number: 12/11/2023, 1:31 PM  Clinical Narrative:                 Patient is from home with her brother. She doesn't drive, but her brother takes her where she needs to go. She doesn't have a PCP and uses [employer]. She has DME walker.   TOC placed list of PCPs on AVS.   Expected Discharge Plan: Home/Self Care Barriers to Discharge: Continued Medical Work up   Patient Goals and CMS Choice            Expected Discharge Plan and Services   Discharge Planning Services: CM Consult   Living arrangements for the past 2 months: Single Family Home                                      Prior Living Arrangements/Services Living arrangements for the past 2 months: Single Family Home Lives with:: Siblings              Current home services: DME (RW)    Activities of Daily Living   ADL Screening (condition at time of admission) Independently performs ADLs?: Yes (appropriate for developmental age) Is the patient deaf or have difficulty hearing?: No Does the patient have difficulty seeing, even when wearing glasses/contacts?: No Does the patient have difficulty concentrating, remembering, or making decisions?: Yes  Permission Sought/Granted                  Emotional Assessment       Orientation: : Oriented to Self, Oriented to Place, Oriented to  Time, Oriented to Situation   Psych Involvement: No (comment)  Admission diagnosis:  Sepsis (HCC) [A41.9] Severe sepsis (HCC) [A41.9, R65.20] Patient Active Problem List   Diagnosis Date Noted   Sepsis (HCC) 12/10/2023   Endometrial cancer (HCC) 11/11/2023   Anemia 11/04/2023   ABLA (acute blood loss anemia) 09/28/2023   Pressure injury of skin 09/28/2023   Vaginal bleeding 09/27/2023   Severe major  depression, single episode, without psychotic features (HCC) 07/26/2020   Depression    Acute delirium 07/25/2020   Acute metabolic encephalopathy    Hypomagnesemia    Chronic alcohol abuse    Hypokalemia 07/24/2020   PCP:  Pcp, No Pharmacy:   [facility] [address] [place] [address] Phone: [phone] Fax: [phone]  [facility] - [place], [place] - [address] [place] [address] Phone: [phone] Fax: [phone]  [id number], [place] - [address] [facility] [address] AT [facility] [address] [facility] [place] [address] Phone: [phone] Fax: [phone]     Social Drivers of Health (SDOH) Social History: SDOH Screenings   Food Insecurity: No Food Insecurity (12/10/2023)  Recent Concern: Food Insecurity - Food Insecurity Present (09/27/2023)  Housing: Low Risk  (12/10/2023)  Transportation Needs: No Transportation Needs (12/10/2023)  Recent Concern: Transportation Needs - Unmet Transportation Needs (09/27/2023)  Utilities: Not At Risk (12/10/2023)  Depression (PHQ2-9): Low Risk  (12/10/2023)  Financial Resource Strain: Low Risk  (10/13/2023)   Received from [facility]  Social Connections:  Unknown (12/11/2023)  Recent Concern: Social Connections - Socially Isolated (09/28/2023)  Tobacco Use: Medium Risk (12/10/2023)   SDOH Interventions:     Readmission Risk Interventions     No data to display

## 2023-12-12 DIAGNOSIS — A419 Sepsis, unspecified organism: Secondary | ICD-10-CM | POA: Diagnosis not present

## 2023-12-12 DIAGNOSIS — R6520 Severe sepsis without septic shock: Secondary | ICD-10-CM | POA: Diagnosis not present

## 2023-12-12 LAB — BPAM RBC
Blood Product Expiration Date: 202510082359
ISSUE DATE / TIME: 202509051034
Unit Type and Rh: 5100

## 2023-12-12 LAB — TYPE AND SCREEN
ABO/RH(D): O POS
Antibody Screen: NEGATIVE
Unit division: 0

## 2023-12-12 LAB — CBC
HCT: 31.5 % — ABNORMAL LOW (ref 36.0–46.0)
Hemoglobin: 9.9 g/dL — ABNORMAL LOW (ref 12.0–15.0)
MCH: 26.1 pg (ref 26.0–34.0)
MCHC: 31.4 g/dL (ref 30.0–36.0)
MCV: 82.9 fL (ref 80.0–100.0)
Platelets: 506 K/uL — ABNORMAL HIGH (ref 150–400)
RBC: 3.8 MIL/uL — ABNORMAL LOW (ref 3.87–5.11)
RDW: 17.2 % — ABNORMAL HIGH (ref 11.5–15.5)
WBC: 16 K/uL — ABNORMAL HIGH (ref 4.0–10.5)
nRBC: 0 % (ref 0.0–0.2)

## 2023-12-12 LAB — MRSA NEXT GEN BY PCR, NASAL: MRSA by PCR Next Gen: NOT DETECTED

## 2023-12-12 LAB — BASIC METABOLIC PANEL WITH GFR
Anion gap: 5 (ref 5–15)
BUN: <5 mg/dL — ABNORMAL LOW (ref 8–23)
CO2: 26 mmol/L (ref 22–32)
Calcium: 8 mg/dL — ABNORMAL LOW (ref 8.9–10.3)
Chloride: 104 mmol/L (ref 98–111)
Creatinine, Ser: 0.47 mg/dL (ref 0.44–1.00)
GFR, Estimated: >60 mL/min (ref >60)
Glucose, Bld: 104 mg/dL — ABNORMAL HIGH (ref 70–99)
Potassium: 3.1 mmol/L — ABNORMAL LOW (ref 3.5–5.1)
Sodium: 135 mmol/L (ref 135–145)

## 2023-12-12 MED ORDER — POTASSIUM CHLORIDE CRYS ER 20 MEQ PO TBCR
40.0000 meq | EXTENDED_RELEASE_TABLET | Freq: Two times a day (BID) | ORAL | Status: AC
Start: 2023-12-12 — End: 2023-12-12
  Administered 2023-12-12 (×2): 40 meq via ORAL
  Filled 2023-12-12 (×2): qty 2

## 2023-12-12 MED ORDER — DOCUSATE SODIUM 100 MG PO CAPS
200.0000 mg | ORAL_CAPSULE | Freq: Two times a day (BID) | ORAL | Status: DC
  Administered 2023-12-12 – 2023-12-15 (×7): 200 mg via ORAL
  Filled 2023-12-12 (×8): qty 2

## 2023-12-12 MED ORDER — POLYETHYLENE GLYCOL 3350 17 G PO PACK
17.0000 g | PACK | Freq: Every day | ORAL | Status: DC | PRN
  Administered 2023-12-13: 17 g via ORAL
  Filled 2023-12-12 (×2): qty 1

## 2023-12-12 NOTE — Plan of Care (Signed)
  Problem: Fluid Volume: Goal: Hemodynamic stability will improve Outcome: Progressing   Problem: Clinical Measurements: Goal: Diagnostic test results will improve Outcome: Progressing Goal: Signs and symptoms of infection will decrease Outcome: Progressing   Problem: Respiratory: Goal: Ability to maintain adequate ventilation will improve Outcome: Progressing   Problem: Clinical Measurements: Goal: Ability to maintain clinical measurements within normal limits will improve Outcome: Progressing Goal: Will remain free from infection Outcome: Progressing Goal: Diagnostic test results will improve Outcome: Progressing Goal: Respiratory complications will improve Outcome: Progressing Goal: Cardiovascular complication will be avoided Outcome: Progressing   Problem: Nutrition: Goal: Adequate nutrition will be maintained Outcome: Progressing   Problem: Pain Managment: Goal: General experience of comfort will improve and/or be controlled Outcome: Progressing   Problem: Safety: Goal: Ability to remain free from injury will improve Outcome: Progressing

## 2023-12-12 NOTE — Progress Notes (Addendum)
 PROGRESS NOTE    Valerie Leonard  FMW:[id number] DOB: 1955-12-19 DOA: 12/10/2023 PCP: Pcp, No    Assessment & Plan:   Principal Problem:   Sepsis (HCC) Active Problems:   Chronic alcohol abuse   Severe major depression, single episode, without psychotic features (HCC)   Anemia   Endometrial cancer (HCC)  Assessment and Plan: Severe sepsis: see how pt met severe sepsis criteria as per Dr. Sherrilee note.  No obvious source of her sepsis. Blood cxs NGTD. Urine cx shows no growth. CXR was neg. RSV, influenza, & COVID19 are all neg. Continue on IV flagyl , cefepime . D/c IV vanco. WBC is trending down    Endometrial cancer: still w/ vaginal bleeding. Will f/u outpatient w/ onco. Pt being prepped for chemotherapy.   ACD: secondary to endometrial bleed. S/p 1 unit of pRBCs transfused so far. H&H are trending up   Hx of alcohol abuse: remote hx. Will continue to monitor     Acute metabolic encephalopathy: likely secondary to possible infection. Mental status is improving daily    Thrombocytosis: likely reactive. Will continue to monitor       DVT prophylaxis: SCDs Code Status: full  Family Communication:  Disposition Plan: depends on PT recs  Level of care: Telemetry Medical  Status is: Inpatient Remains inpatient appropriate because: severity of illness, requiring IV abxs    Consultants:    Procedures:   Antimicrobials: cefepime , flagyl ,   Subjective: Pt c/o fatigue   Objective: Vitals:   12/11/23 1104 12/11/23 1225 12/12/23 0639 12/12/23 0809  BP: (!) 97/55 104/63 112/67 124/67  Pulse: 91 86 88 83  Resp: 18 18 18    Temp: 98.2 F (36.8 C) 98.8 F (37.1 C) 98.3 F (36.8 C) 98.5 F (36.9 C)  TempSrc: Oral Oral  Oral  SpO2: 100% 100% 100% 96%  Weight:      Height:        Intake/Output Summary (Last 24 hours) at 12/12/2023 0930 Last data filed at 12/12/2023 9347 Gross per 24 hour  Intake 3004.61 ml  Output 850 ml  Net 2154.61 ml   Filed Weights   12/10/23  1517  Weight: 49 kg    Examination:  General exam: appears anxious  Respiratory system: clear breath sounds b/l  Cardiovascular system: S1/S2+. No rubs or clicks Gastrointestinal system: abd is soft, ND & hypoactive bowel sounds Central nervous system: alert & oriented. Moves all extremities  Psychiatry: Judgement and insight appears improved. Anxious mood and affect     Data Reviewed: I have personally reviewed following labs and imaging studies  CBC: Recent Labs  Lab 12/10/23 0957 12/10/23 1153 12/10/23 1520 12/11/23 0445 12/11/23 1644 12/12/23 0737  WBC 23.5* 25.9* 27.1* 19.7*  --  16.0*  NEUTROABS  --  23.0* 23.3*  --   --   --   HGB 9.8* 9.9* 8.7* 6.9* 9.2* 9.9*  HCT 32.1* 32.0* 28.2* 22.3* 28.8* 31.5*  MCV 83.4 82.9 83.7 84.5  --  82.9  PLT 625* 679* 611* 467*  --  506*   Basic Metabolic Panel: Recent Labs  Lab 12/10/23 1153 12/10/23 1520 12/11/23 0445 12/12/23 0737  NA 135 135 137 135  K 3.9 3.6 3.5 3.1*  CL 99 104 110 104  CO2 23 22 24 26   GLUCOSE 101* 143* 89 104*  BUN 14 12 9  <5*  CREATININE 0.61 0.52 0.31* 0.47  CALCIUM  8.9 7.9* 7.4* 8.0*   GFR: Estimated Creatinine Clearance: 51.5 mL/min (by C-G formula based on SCr of  0.47 mg/dL). Liver Function Tests: Recent Labs  Lab 12/10/23 1153 12/10/23 1520 12/11/23 0445  AST 26 25 16   ALT 13 11 9   ALKPHOS 71 63 45  BILITOT 0.6 0.5 0.5  PROT 8.0 7.3 5.4*  ALBUMIN 2.7* 2.3* 1.7*   No results for input(s): LIPASE, AMYLASE in the last 168 hours. No results for input(s): AMMONIA in the last 168 hours. Coagulation Profile: Recent Labs  Lab 12/11/23 0445  INR 1.4*   Cardiac Enzymes: No results for input(s): CKTOTAL, CKMB, CKMBINDEX, TROPONINI in the last 168 hours. BNP (last 3 results) No results for input(s): PROBNP in the last 8760 hours. HbA1C: No results for input(s): HGBA1C in the last 72 hours. CBG: No results for input(s): GLUCAP in the last 168 hours. Lipid  Profile: No results for input(s): CHOL, HDL, LDLCALC, TRIG, CHOLHDL, LDLDIRECT in the last 72 hours. Thyroid  Function Tests: No results for input(s): TSH, T4TOTAL, FREET4, T3FREE, THYROIDAB in the last 72 hours. Anemia Panel: No results for input(s): VITAMINB12, FOLATE, FERRITIN, TIBC, IRON , RETICCTPCT in the last 72 hours. Sepsis Labs: Recent Labs  Lab 12/10/23 1520 12/10/23 2040  PROCALCITON 5.76  --   LATICACIDVEN 2.2* 1.3    Recent Results (from the past 240 hours)  Blood culture (routine x 2)     Status: None (Preliminary result)   Collection Time: 12/10/23  3:20 PM   Specimen: BLOOD  Result Value Ref Range Status   Specimen Description BLOOD RIGHT ANTECUBITAL  Final   Special Requests   Final    BOTTLES DRAWN AEROBIC AND ANAEROBIC Blood Culture adequate volume   Culture   Final    NO GROWTH 2 DAYS Performed at [facility], [address] [place], [place] [address]    Report Status PENDING  Incomplete  Resp panel by RT-PCR (RSV, Flu A&B, Covid) Anterior Nasal Swab     Status: None   Collection Time: 12/10/23  4:25 PM   Specimen: Anterior Nasal Swab  Result Value Ref Range Status   SARS Coronavirus 2 by RT PCR NEGATIVE NEGATIVE Final    Comment: (NOTE) SARS-CoV-2 target nucleic acids are NOT DETECTED.  The SARS-CoV-2 RNA is generally detectable in upper respiratory specimens during the acute phase of infection. The lowest concentration of SARS-CoV-2 viral copies this assay can detect is 138 copies/mL. A negative result does not preclude SARS-Cov-2 infection and should not be used as the sole basis for treatment or other patient management decisions. A negative result may occur with  improper specimen collection/handling, submission of specimen other than nasopharyngeal swab, presence of viral mutation(s) within the areas targeted by this assay, and inadequate number of viral copies(<138 copies/mL). A negative result must  be combined with clinical observations, patient history, and epidemiological information. The expected result is Negative.  Fact Sheet for Patients:  BloggerCourse.com  Fact Sheet for Healthcare Providers:  SeriousBroker.it  This test is no t yet approved or cleared by the [place]  FDA and  has been authorized for detection and/or diagnosis of SARS-CoV-2 by FDA under an Emergency Use Authorization (EUA). This EUA will remain  in effect (meaning this test can be used) for the duration of the COVID-19 declaration under Section 564(b)(1) of the Act, 21 U.S.C.section 360bbb-3(b)(1), unless the authorization is terminated  or revoked sooner.       Influenza A by PCR NEGATIVE NEGATIVE Final   Influenza B by PCR NEGATIVE NEGATIVE Final    Comment: (NOTE) The Xpert Xpress SARS-CoV-2/FLU/RSV plus assay is intended  as an aid in the diagnosis of influenza from Nasopharyngeal swab specimens and should not be used as a sole basis for treatment. Nasal washings and aspirates are unacceptable for Xpert Xpress SARS-CoV-2/FLU/RSV testing.  Fact Sheet for Patients: BloggerCourse.com  Fact Sheet for Healthcare Providers: SeriousBroker.it  This test is not yet approved or cleared by the [place]  FDA and has been authorized for detection and/or diagnosis of SARS-CoV-2 by FDA under an Emergency Use Authorization (EUA). This EUA will remain in effect (meaning this test can be used) for the duration of the COVID-19 declaration under Section 564(b)(1) of the Act, 21 U.S.C. section 360bbb-3(b)(1), unless the authorization is terminated or revoked.     Resp Syncytial Virus by PCR NEGATIVE NEGATIVE Final    Comment: (NOTE) Fact Sheet for Patients: BloggerCourse.com  Fact Sheet for Healthcare Providers: SeriousBroker.it  This test is not  yet approved or cleared by the [place]  FDA and has been authorized for detection and/or diagnosis of SARS-CoV-2 by FDA under an Emergency Use Authorization (EUA). This EUA will remain in effect (meaning this test can be used) for the duration of the COVID-19 declaration under Section 564(b)(1) of the Act, 21 U.S.C. section 360bbb-3(b)(1), unless the authorization is terminated or revoked.  Performed at [facility], [address] [place], [place] [address]   Urine Culture     Status: None   Collection Time: 12/10/23  4:25 PM   Specimen: Urine, Clean Catch  Result Value Ref Range Status   Specimen Description   Final    URINE, CLEAN CATCH Performed at [facility], [address] [place], [place] [address]    Special Requests   Final    NONE Performed at [facility], [address], [place], [place] [address]    Culture   Final    NO GROWTH Performed at [facility], [address] [place], [place] [address]    Report Status 12/11/2023 FINAL  Final  Blood culture (routine x 2)     Status: None (Preliminary result)   Collection Time: 12/10/23  8:41 PM   Specimen: BLOOD  Result Value Ref Range Status   Specimen Description BLOOD BLOOD LEFT HAND [facility]  Final   Special Requests   Final    BOTTLES DRAWN AEROBIC AND ANAEROBIC Blood Culture adequate volume   Culture   Final    NO GROWTH 2 DAYS Performed at [facility], [address], [place], [place] [address]    Report Status PENDING  Incomplete         Radiology Studies: DG Chest Port 1 View Result Date: 12/10/2023 CLINICAL DATA:  Infection. EXAM: PORTABLE CHEST 1 VIEW COMPARISON:  11/22/2023. FINDINGS: The heart size and mediastinal contours are unchanged. No focal consolidation, pleural effusion, or pneumothorax. No acute osseous abnormality. IMPRESSION: No acute cardiopulmonary findings. Electronically Signed   By: Harrietta Sherry M.D.   On: 12/10/2023 16:20   IR  PATIENT EVAL TECH 0-60 MINS Result Date: 12/10/2023 Delight Bebe SAILOR     12/10/2023 12:32 PM Procedure cancelled order used for wasted supplies       Scheduled Meds:  diphenhydrAMINE   50 mg Oral QHS   escitalopram   5 mg Oral Daily   feeding supplement  237 mL Oral BID BM   Continuous Infusions:  ceFEPime  (MAXIPIME ) IV 2 g (12/12/23 0915)   metronidazole  500 mg (12/11/23 2257)   vancomycin  750 mg (12/11/23 1949)     LOS: 2 days  Anthony CHRISTELLA Pouch, MD [facility] Pager [phone]  If 7PM-7AM, please contact night-coverage [email or web address] 12/12/2023, 9:30 AM

## 2023-12-13 ENCOUNTER — Encounter: Payer: Self-pay | Admitting: Oncology

## 2023-12-13 DIAGNOSIS — C541 Malignant neoplasm of endometrium: Secondary | ICD-10-CM | POA: Diagnosis not present

## 2023-12-13 LAB — BASIC METABOLIC PANEL WITH GFR
Anion gap: 8 (ref 5–15)
BUN: 8 mg/dL (ref 8–23)
CO2: 26 mmol/L (ref 22–32)
Calcium: 8.5 mg/dL — ABNORMAL LOW (ref 8.9–10.3)
Chloride: 100 mmol/L (ref 98–111)
Creatinine, Ser: 0.42 mg/dL — ABNORMAL LOW (ref 0.44–1.00)
GFR, Estimated: >60 mL/min (ref >60)
Glucose, Bld: 97 mg/dL (ref 70–99)
Potassium: 4.3 mmol/L (ref 3.5–5.1)
Sodium: 134 mmol/L — ABNORMAL LOW (ref 135–145)

## 2023-12-13 LAB — CBC
HCT: 31.6 % — ABNORMAL LOW (ref 36.0–46.0)
Hemoglobin: 10 g/dL — ABNORMAL LOW (ref 12.0–15.0)
MCH: 26.5 pg (ref 26.0–34.0)
MCHC: 31.6 g/dL (ref 30.0–36.0)
MCV: 83.6 fL (ref 80.0–100.0)
Platelets: 501 K/uL — ABNORMAL HIGH (ref 150–400)
RBC: 3.78 MIL/uL — ABNORMAL LOW (ref 3.87–5.11)
RDW: 17.9 % — ABNORMAL HIGH (ref 11.5–15.5)
WBC: 12.6 K/uL — ABNORMAL HIGH (ref 4.0–10.5)
nRBC: 0 % (ref 0.0–0.2)

## 2023-12-13 MED ORDER — SODIUM CHLORIDE 0.9 % IV BOLUS
1000.0000 mL | Freq: Once | INTRAVENOUS | Status: AC
  Administered 2023-12-13: 1000 mL via INTRAVENOUS

## 2023-12-13 NOTE — Progress Notes (Signed)
 PROGRESS NOTE    Valerie Leonard  FMW:[id number] DOB: 1956-01-08 DOA: 12/10/2023 PCP: Pcp, No    Assessment & Plan:   Principal Problem:   Sepsis (HCC) Active Problems:   Chronic alcohol abuse   Severe major depression, single episode, without psychotic features (HCC)   Anemia   Endometrial cancer (HCC)  Assessment and Plan: Severe sepsis: see how pt met severe sepsis criteria as per Dr. Sherrilee note.  No obvious source of her sepsis. Blood cxs NGTD. Urine cx shows no growth. CXR was neg. RSV, influenza, & COVID19 are all neg. Continue on IV flagyl , cefepime . D/c IV vanco. WBC continues to trend down     Endometrial cancer: stage III. Oncologist is Dr. Melanee. Still w/ vaginal bleeding. Will f/u outpatient w/ onco. Pt being prepped for chemotherapy.   ACD: secondary to endometrial bleed. S/p 1 unit of pRBCs transfused so far. H&H are trending up again today   Hx of alcohol abuse: remote hx. Will continue to monitor     Acute metabolic encephalopathy: likely secondary to possible infection. Mental status has improved   Hypotension: episode of hypotension while walking w/ therapy today. NS bolus x1.    Thrombocytosis: likely reactive. Will continue to monitor       DVT prophylaxis: SCDs Code Status: full  Family Communication:  Disposition Plan: HH vs SNF  Level of care: Telemetry Medical  Status is: Inpatient Remains inpatient appropriate because: severity of illness, requiring IV abxs    Consultants:    Procedures:   Antimicrobials: cefepime , flagyl ,   Subjective: Pt c/o feeling lightheaded   Objective: Vitals:   12/12/23 1752 12/12/23 1958 12/13/23 0010 12/13/23 0419  BP: 103/76 (!) 87/60 104/62 114/62  Pulse: 89 (!) 102 93 85  Resp:   16   Temp: 99.8 F (37.7 C) 98.2 F (36.8 C) 98.7 F (37.1 C) 98.3 F (36.8 C)  TempSrc:  Oral  Oral  SpO2: 97% 95% 95% 98%  Weight:      Height:        Intake/Output Summary (Last 24 hours) at 12/13/2023 0918 Last  data filed at 12/12/2023 1300 Gross per 24 hour  Intake --  Output 800 ml  Net -800 ml   Filed Weights   12/10/23 1517  Weight: 49 kg    Examination:  General exam: appears uncomfortable   Respiratory system: clear breath sounds b/l  Cardiovascular system: S1 & S2+. No rubs or clicks  Gastrointestinal system: abd is soft, NT, ND & hypoactive bowel sounds Central nervous system: alert & awake. Moves all extremities  Psychiatry: Judgement and insight appears unchanged from day prior. Delirious mood and affect     Data Reviewed: I have personally reviewed following labs and imaging studies  CBC: Recent Labs  Lab 12/10/23 1153 12/10/23 1520 12/11/23 0445 12/11/23 1644 12/12/23 0737 12/13/23 0409  WBC 25.9* 27.1* 19.7*  --  16.0* 12.6*  NEUTROABS 23.0* 23.3*  --   --   --   --   HGB 9.9* 8.7* 6.9* 9.2* 9.9* 10.0*  HCT 32.0* 28.2* 22.3* 28.8* 31.5* 31.6*  MCV 82.9 83.7 84.5  --  82.9 83.6  PLT 679* 611* 467*  --  506* 501*   Basic Metabolic Panel: Recent Labs  Lab 12/10/23 1153 12/10/23 1520 12/11/23 0445 12/12/23 0737 12/13/23 0409  NA 135 135 137 135 134*  K 3.9 3.6 3.5 3.1* 4.3  CL 99 104 110 104 100  CO2 23 22 24 26 26   GLUCOSE  101* 143* 89 104* 97  BUN 14 12 9  <5* 8  CREATININE 0.61 0.52 0.31* 0.47 0.42*  CALCIUM  8.9 7.9* 7.4* 8.0* 8.5*   GFR: Estimated Creatinine Clearance: 51.5 mL/min (A) (by C-G formula based on SCr of 0.42 mg/dL (L)). Liver Function Tests: Recent Labs  Lab 12/10/23 1153 12/10/23 1520 12/11/23 0445  AST 26 25 16   ALT 13 11 9   ALKPHOS 71 63 45  BILITOT 0.6 0.5 0.5  PROT 8.0 7.3 5.4*  ALBUMIN 2.7* 2.3* 1.7*   No results for input(s): LIPASE, AMYLASE in the last 168 hours. No results for input(s): AMMONIA in the last 168 hours. Coagulation Profile: Recent Labs  Lab 12/11/23 0445  INR 1.4*   Cardiac Enzymes: No results for input(s): CKTOTAL, CKMB, CKMBINDEX, TROPONINI in the last 168 hours. BNP (last 3  results) No results for input(s): PROBNP in the last 8760 hours. HbA1C: No results for input(s): HGBA1C in the last 72 hours. CBG: No results for input(s): GLUCAP in the last 168 hours. Lipid Profile: No results for input(s): CHOL, HDL, LDLCALC, TRIG, CHOLHDL, LDLDIRECT in the last 72 hours. Thyroid  Function Tests: No results for input(s): TSH, T4TOTAL, FREET4, T3FREE, THYROIDAB in the last 72 hours. Anemia Panel: No results for input(s): VITAMINB12, FOLATE, FERRITIN, TIBC, IRON , RETICCTPCT in the last 72 hours. Sepsis Labs: Recent Labs  Lab 12/10/23 1520 12/10/23 2040  PROCALCITON 5.76  --   LATICACIDVEN 2.2* 1.3    Recent Results (from the past 240 hours)  Blood culture (routine x 2)     Status: None (Preliminary result)   Collection Time: 12/10/23  3:20 PM   Specimen: BLOOD  Result Value Ref Range Status   Specimen Description BLOOD RIGHT ANTECUBITAL  Final   Special Requests   Final    BOTTLES DRAWN AEROBIC AND ANAEROBIC Blood Culture adequate volume   Culture   Final    NO GROWTH 3 DAYS Performed at [facility], [address], [place], [place] [address]    Report Status PENDING  Incomplete  Resp panel by RT-PCR (RSV, Flu A&B, Covid) Anterior Nasal Swab     Status: None   Collection Time: 12/10/23  4:25 PM   Specimen: Anterior Nasal Swab  Result Value Ref Range Status   SARS Coronavirus 2 by RT PCR NEGATIVE NEGATIVE Final    Comment: (NOTE) SARS-CoV-2 target nucleic acids are NOT DETECTED.  The SARS-CoV-2 RNA is generally detectable in upper respiratory specimens during the acute phase of infection. The lowest concentration of SARS-CoV-2 viral copies this assay can detect is 138 copies/mL. A negative result does not preclude SARS-Cov-2 infection and should not be used as the sole basis for treatment or other patient management decisions. A negative result may occur with  improper specimen collection/handling,  submission of specimen other than nasopharyngeal swab, presence of viral mutation(s) within the areas targeted by this assay, and inadequate number of viral copies(<138 copies/mL). A negative result must be combined with clinical observations, patient history, and epidemiological information. The expected result is Negative.  Fact Sheet for Patients:  BloggerCourse.com  Fact Sheet for Healthcare Providers:  SeriousBroker.it  This test is no t yet approved or cleared by the [place]  FDA and  has been authorized for detection and/or diagnosis of SARS-CoV-2 by FDA under an Emergency Use Authorization (EUA). This EUA will remain  in effect (meaning this test can be used) for the duration of the COVID-19 declaration under Section 564(b)(1) of the Act, 21 U.S.C.section 360bbb-3(b)(1), unless  the authorization is terminated  or revoked sooner.       Influenza A by PCR NEGATIVE NEGATIVE Final   Influenza B by PCR NEGATIVE NEGATIVE Final    Comment: (NOTE) The Xpert Xpress SARS-CoV-2/FLU/RSV plus assay is intended as an aid in the diagnosis of influenza from Nasopharyngeal swab specimens and should not be used as a sole basis for treatment. Nasal washings and aspirates are unacceptable for Xpert Xpress SARS-CoV-2/FLU/RSV testing.  Fact Sheet for Patients: BloggerCourse.com  Fact Sheet for Healthcare Providers: SeriousBroker.it  This test is not yet approved or cleared by the [place]  FDA and has been authorized for detection and/or diagnosis of SARS-CoV-2 by FDA under an Emergency Use Authorization (EUA). This EUA will remain in effect (meaning this test can be used) for the duration of the COVID-19 declaration under Section 564(b)(1) of the Act, 21 U.S.C. section 360bbb-3(b)(1), unless the authorization is terminated or revoked.     Resp Syncytial Virus by PCR NEGATIVE  NEGATIVE Final    Comment: (NOTE) Fact Sheet for Patients: BloggerCourse.com  Fact Sheet for Healthcare Providers: SeriousBroker.it  This test is not yet approved or cleared by the [place]  FDA and has been authorized for detection and/or diagnosis of SARS-CoV-2 by FDA under an Emergency Use Authorization (EUA). This EUA will remain in effect (meaning this test can be used) for the duration of the COVID-19 declaration under Section 564(b)(1) of the Act, 21 U.S.C. section 360bbb-3(b)(1), unless the authorization is terminated or revoked.  Performed at [facility], [address], [place], [place] [address]   Urine Culture     Status: None   Collection Time: 12/10/23  4:25 PM   Specimen: Urine, Clean Catch  Result Value Ref Range Status   Specimen Description   Final    URINE, CLEAN CATCH Performed at [facility], [address], [place], [place] [address]    Special Requests   Final    NONE Performed at [place], [address], [place], [place] [address]    Culture   Final    NO GROWTH Performed at [facility], [address] [place], [place] [address]    Report Status 12/11/2023 FINAL  Final  Blood culture (routine x 2)     Status: None (Preliminary result)   Collection Time: 12/10/23  8:41 PM   Specimen: BLOOD  Result Value Ref Range Status   Specimen Description BLOOD BLOOD LEFT HAND [facility]  Final   Special Requests   Final    BOTTLES DRAWN AEROBIC AND ANAEROBIC Blood Culture adequate volume   Culture   Final    NO GROWTH 3 DAYS Performed at [facility], [address], [place], [place] [address]    Report Status PENDING  Incomplete  MRSA Next Gen by PCR, Nasal     Status: None   Collection Time: 12/12/23 10:28 AM   Specimen: Nasal Mucosa; Nasal Swab  Result Value Ref Range Status   MRSA by PCR Next Gen NOT DETECTED NOT DETECTED Final    Comment:  (NOTE) The GeneXpert MRSA Assay (FDA approved for NASAL specimens only), is one component of a comprehensive MRSA colonization surveillance program. It is not intended to diagnose MRSA infection nor to guide or monitor treatment for MRSA infections. Test performance is not FDA approved in patients less than 39 years old. Performed at [facility], [address], [place], [place] [address]          Radiology  Studies: No results found.       Scheduled Meds:  diphenhydrAMINE   50 mg Oral QHS   docusate sodium   200 mg Oral BID   escitalopram   5 mg Oral Daily   feeding supplement  237 mL Oral BID BM   Continuous Infusions:  ceFEPime  (MAXIPIME ) IV 2 g (12/13/23 0911)   metronidazole  500 mg (12/12/23 2307)     LOS: 3 days      Anthony CHRISTELLA Pouch, MD [facility] Pager [phone]  If 7PM-7AM, please contact night-coverage [email or web address] 12/13/2023, 9:18 AM

## 2023-12-13 NOTE — Evaluation (Signed)
 Physical Therapy Evaluation Patient Details Name: Valerie Leonard MRN: [id number] DOB: [birth year] Today's Date: 12/13/2023  History of Present Illness  Patient is a 68 year old female with sepsis. PMH: endometrial cancer, alcohol abuse, acute metabolic encephalopathy  Clinical Impression  Patient is agreeable to PT evaluation. She reports she lives alone and uses a rolling walker for mobility in the home. Her sister takes her to appointments.  Today the patient reports knee pain. She requested to get out of bed and try walking. She requires assistance with bed mobility, transfers, and ambulation. Ambulation in room and hallway, a total of 68ft, but required chair due to fatigue and feeling dizzy. Blood pressure 89/63 after walking. At this time patient would likely require caregiver support for safe return home. Consider rehabilitation < 3 hours/day after this hospital stay.       If plan is discharge home, recommend the following: A lot of help with walking and/or transfers;A lot of help with bathing/dressing/bathroom;Assist for transportation;Help with stairs or ramp for entrance;Assistance with cooking/housework   Can travel by private vehicle   No    Equipment Recommendations None recommended by PT  Recommendations for Other Services       Functional Status Assessment Patient has had a recent decline in their functional status and demonstrates the ability to make significant improvements in function in a reasonable and predictable amount of time.     Precautions / Restrictions Precautions Precautions: Fall Recall of Precautions/Restrictions: Impaired Restrictions Weight Bearing Restrictions Per Provider Order: No      Mobility  Bed Mobility Overal bed mobility: Needs Assistance Bed Mobility: Supine to Sit, Sit to Supine     Supine to sit: Min assist Sit to supine: Mod assist   General bed mobility comments: increased time and effort required.    Transfers Overall  transfer level: Needs assistance Equipment used: Rolling walker (2 wheels) Transfers: Sit to/from Stand, Bed to chair/wheelchair/BSC Sit to Stand: Min assist, Mod assist   Step pivot transfers: Mod assist       General transfer comment: verbal cues for hand placement, technique. several standing bouts performed from various surfaces including bed, recliner, and chair with no arms    Ambulation/Gait Ambulation/Gait assistance: Contact guard assist, Min assist Gait Distance (Feet): 75 Feet Assistive device: Rolling walker (2 wheels) Gait Pattern/deviations: Decreased stance time - right, Decreased stride length, Step-to pattern, Decreased dorsiflexion - right Gait velocity: decreased     General Gait Details: difficulty with heel strike on the right, often walking on forefoot despite cues. patient prefers to hold the walker bar in front with LUE due to IV placement. patient overestimates her abilities with ambulation. she reports knees feeling weak, feeling dizzy towards end of walking bout. chair brought by nurse for safety. BP 89/63  Stairs            Wheelchair Mobility     Tilt Bed    Modified Rankin (Stroke Patients Only)       Balance Overall balance assessment: Needs assistance Sitting-balance support: Feet supported Sitting balance-Leahy Scale: Fair     Standing balance support: Bilateral upper extremity supported Standing balance-Leahy Scale: Poor Standing balance comment: intermittent external support required with heavy reliance on rolling walker for support                             Pertinent Vitals/Pain Pain Assessment Pain Assessment: Faces Faces Pain Scale: Hurts little more Pain Location: bilateral  knees Pain Descriptors / Indicators: Discomfort Pain Intervention(s): Limited activity within patient's tolerance, Monitored during session, Repositioned    Home Living Family/patient expects to be discharged to:: Private  residence Living Arrangements: Alone Available Help at Discharge: Family;Available PRN/intermittently Type of Home: House Home Access: Ramped entrance       Home Layout: One level Home Equipment: Agricultural consultant (2 wheels);Shower seat;Transport chair      Prior Function Prior Level of Function : Independent/Modified Independent             Mobility Comments: Mod I for household ambulation using rolling walker. History of falls. uses a transport chair for community mobility ADLs Comments: sister drives. Mod I for ADLs     Extremity/Trunk Assessment   Upper Extremity Assessment Upper Extremity Assessment: Generalized weakness    Lower Extremity Assessment Lower Extremity Assessment: Generalized weakness       Communication   Communication Communication: No apparent difficulties    Cognition Arousal: Alert Behavior During Therapy: WFL for tasks assessed/performed   PT - Cognitive impairments: Safety/Judgement, Awareness                       PT - Cognition Comments: patient overestimates abilities, decreased awareness of physical limitations, need for rest breaks, etc. Following commands: Impaired Following commands impaired: Follows one step commands with increased time     Cueing Cueing Techniques: Verbal cues     General Comments      Exercises     Assessment/Plan    PT Assessment Patient needs continued PT services  PT Problem List Decreased strength;Decreased range of motion;Decreased activity tolerance;Decreased balance;Decreased mobility;Decreased safety awareness;Decreased knowledge of use of DME       PT Treatment Interventions DME instruction;Gait training;Stair training;Functional mobility training;Therapeutic activities;Therapeutic exercise;Balance training;Neuromuscular re-education;Cognitive remediation;Patient/family education    PT Goals (Current goals can be found in the Care Plan section)  Acute Rehab PT Goals Patient Stated  Goal: to go home PT Goal Formulation: With patient Time For Goal Achievement: 12/27/23 Potential to Achieve Goals: Good    Frequency Min 2X/week     Co-evaluation               AM-PAC PT 6 Clicks Mobility  Outcome Measure Help needed turning from your back to your side while in a flat bed without using bedrails?: A Little Help needed moving from lying on your back to sitting on the side of a flat bed without using bedrails?: A Lot Help needed moving to and from a bed to a chair (including a wheelchair)?: A Lot Help needed standing up from a chair using your arms (e.g., wheelchair or bedside chair)?: A Lot Help needed to walk in hospital room?: A Little Help needed climbing 3-5 steps with a railing? : A Lot 6 Click Score: 14    End of Session   Activity Tolerance: Patient limited by fatigue Patient left: in bed;with call bell/phone within reach;with bed alarm set;with nursing/sitter in room Nurse Communication: Mobility status (nurse and MD present for part of session at the end) PT Visit Diagnosis: Muscle weakness (generalized) (M62.81);Unsteadiness on feet (R26.81)    Time: 1132-1220 PT Time Calculation (min) (ACUTE ONLY): 48 min   Charges:   PT Evaluation $PT Eval Moderate Complexity: 1 Mod PT Treatments $Therapeutic Activity: 8-22 mins PT General Charges $$ ACUTE PT VISIT: 1 Visit         Valerie Leonard, PT, MPT   Valerie Leonard 12/13/2023, 12:53 PM

## 2023-12-13 NOTE — Progress Notes (Signed)
 Patient is alert and oriented X 4. She feels dizzy and fatigue while walking with physical therapy.her blood pressure was 89/63 mm of hg. MD made aware. Got new order for IVF bolus. Rechecked blood pressure was 108/60. Denies any pain.

## 2023-12-13 NOTE — Progress Notes (Signed)
 Patient was a yellow mews for BP 87/60 and HR 102.  Patient in no distress. Recheck of VS were  BP 104/62 HR 95.  [facility] APP Notified.

## 2023-12-14 ENCOUNTER — Inpatient Hospital Stay

## 2023-12-14 DIAGNOSIS — C541 Malignant neoplasm of endometrium: Secondary | ICD-10-CM

## 2023-12-14 LAB — CBC
HCT: 31.9 % — ABNORMAL LOW (ref 36.0–46.0)
Hemoglobin: 10.1 g/dL — ABNORMAL LOW (ref 12.0–15.0)
MCH: 26.2 pg (ref 26.0–34.0)
MCHC: 31.7 g/dL (ref 30.0–36.0)
MCV: 82.6 fL (ref 80.0–100.0)
Platelets: 575 K/uL — ABNORMAL HIGH (ref 150–400)
RBC: 3.86 MIL/uL — ABNORMAL LOW (ref 3.87–5.11)
RDW: 17.8 % — ABNORMAL HIGH (ref 11.5–15.5)
WBC: 13.1 K/uL — ABNORMAL HIGH (ref 4.0–10.5)
nRBC: 0 % (ref 0.0–0.2)

## 2023-12-14 LAB — BASIC METABOLIC PANEL WITH GFR
Anion gap: 11 (ref 5–15)
BUN: 7 mg/dL — ABNORMAL LOW (ref 8–23)
CO2: 25 mmol/L (ref 22–32)
Calcium: 8.7 mg/dL — ABNORMAL LOW (ref 8.9–10.3)
Chloride: 98 mmol/L (ref 98–111)
Creatinine, Ser: 0.38 mg/dL — ABNORMAL LOW (ref 0.44–1.00)
GFR, Estimated: >60 mL/min (ref >60)
Glucose, Bld: 106 mg/dL — ABNORMAL HIGH (ref 70–99)
Potassium: 4.1 mmol/L (ref 3.5–5.1)
Sodium: 134 mmol/L — ABNORMAL LOW (ref 135–145)

## 2023-12-14 MED ORDER — IOHEXOL 9 MG/ML PO SOLN
500.0000 mL | ORAL | Status: AC
Start: 2023-12-14 — End: 2023-12-14
  Administered 2023-12-14 (×2): 500 mL via ORAL

## 2023-12-14 MED ORDER — IRON SUCROSE 200 MG IVPB - SIMPLE MED
200.0000 mg | Freq: Once | Status: AC
  Administered 2023-12-14: 200 mg via INTRAVENOUS
  Filled 2023-12-14: qty 200

## 2023-12-14 MED ORDER — LACTULOSE 10 GM/15ML PO SOLN
20.0000 g | Freq: Two times a day (BID) | ORAL | Status: DC
  Administered 2023-12-14 (×2): 20 g via ORAL
  Filled 2023-12-14 (×4): qty 30

## 2023-12-14 MED ORDER — MIDODRINE HCL 5 MG PO TABS
10.0000 mg | ORAL_TABLET | Freq: Once | ORAL | Status: AC
  Administered 2023-12-14: 10 mg via ORAL
  Filled 2023-12-14: qty 2

## 2023-12-14 MED ORDER — SODIUM CHLORIDE 0.9 % IV SOLN: 125.0000 mg | Freq: Once | INTRAVENOUS | Status: DC

## 2023-12-14 MED ORDER — SODIUM CHLORIDE 0.9 % IV SOLN
2.0000 g | INTRAVENOUS | Status: DC
  Administered 2023-12-14 – 2023-12-16 (×3): 2 g via INTRAVENOUS
  Filled 2023-12-14 (×4): qty 20

## 2023-12-14 NOTE — Consult Note (Signed)
 Chief Complaint:  Endometrial Cancer  Procedure: Port-a-catheter insertion  Referring Provider(s): Dr. Annah Skene  Supervising Physician: Karalee Beat  Patient Status: [facility] - In-pt  History of Present Illness: Valerie Leonard is a 68 y.o. female with a history of post-menopausal bleeding recently found to have endometrial cancer and tentative plans to start chemotherapy on 12/18/23. Patient presented to the ED on 9/4 after being sent by a neighboring cancer facility for evaluation of fever and concern for sepsis. Patient had presented that morning for her port-a-catheter insertion, but was found to be febrile and was subsequently referred to the ED. Workup significant for leukocytosis of 26, lactic of 2.2, fever up to 102.52F, and hypotension. Given concern for bacteremia she was admitted for further evaluation. Workup has been grossly unremarkable with blood cultures with no growth after 4 days, urine cultures with no growth, RSV/Flu/COVID negative, and no clear source of infection. She was started on IV flagyl , cefepime , and vancomycin . Currently she remains on IV flagyl  and WBC continue to trend down to 13.1 today. IR consulted for possible port-a-catheter placement while inpatient.   Patient is currently resting in bed in no acute distress. She denies any fevers/chills, nausea/vomiting, abdominal pain, shortness of breath, chest pain, dizziness, or palpitations. She reports intermittent vaginal bleeding and pelvic cramps, but is otherwise without complaints.   Discussed with patient and her sister tentative port placement on 9/9 pending patient's WBC. Patient and sister are both agreeable to move forward with this tomorrow. All questions and concerns answered at the bedside.    Patient is Full Code  Past Medical History:  Diagnosis Date   Acute delirium    Acute metabolic encephalopathy    Anemia    blood loss anemia   Chronic alcohol abuse    Depression    Endometrial cancer  (HCC)    Hypokalemia    Hypomagnesemia    Pressure injury of skin    Severe major depression, single episode, without psychotic features (HCC)    Vaginal bleeding     Past Surgical History:  Procedure Laterality Date   IR PATIENT EVAL TECH 0-60 MINS  12/10/2023   NO PAST SURGERIES      Allergies: Patient has no known allergies.  Medications: Prior to Admission medications   Medication Sig Start Date End Date Taking? Authorizing Provider  citalopram  (CELEXA ) 20 MG tablet Take 1 tablet (20 mg total) by mouth daily. Patient not taking: Reported on 11/04/2023 07/28/20   Patel, Sona, MD  dexamethasone  (DECADRON ) 4 MG tablet Take 2 tablets (8mg ) by mouth daily starting the day after carboplatin for 3 days. Take with food 12/02/23   Skene Annah JAYSON, MD  escitalopram  (LEXAPRO ) 5 MG tablet Take 1 tablet every day by oral route in the morning, for depression. Patient not taking: Reported on 12/02/2023 11/24/23   [provider]  folic acid  (FOLVITE ) 1 MG tablet Take 1 tablet (1 mg total) by mouth daily. Patient not taking: Reported on 11/04/2023 07/28/20   Patel, Sona, MD  lidocaine -prilocaine  (EMLA ) cream Apply to affected area once 12/02/23   Skene Annah JAYSON, MD  medroxyPROGESTERone  (PROVERA ) 10 MG tablet Take 1 tablet (10 mg total) by mouth 2 (two) times daily for 7 days. Patient not taking: Reported on 11/04/2023 09/29/23 10/06/23  Dorinda Drue DASEN, MD  Multiple Vitamin (MULTIVITAMIN WITH MINERALS) TABS tablet Take 1 tablet by mouth daily. 07/28/20   Patel, Sona, MD  ondansetron  (ZOFRAN ) 8 MG tablet Take 1 tablet (8 mg  total) by mouth every 8 (eight) hours as needed for nausea or vomiting. Start on the third day after carboplatin. 12/02/23   Melanee Annah BROCKS, MD  prochlorperazine  (COMPAZINE ) 10 MG tablet Take 1 tablet (10 mg total) by mouth every 6 (six) hours as needed for nausea or vomiting. 12/02/23   Melanee Annah BROCKS, MD  spironolactone (ALDACTONE) 25 MG tablet Take 12.5 mg by mouth. 11/17/23 11/16/24   [provider]     History reviewed. No pertinent family history.  Social History   Socioeconomic History   Marital status: Single    Spouse name: Not on file   Number of children: 0   Years of education: Not on file   Highest education level: Not on file  Occupational History   Not on file  Tobacco Use   Smoking status: Former    Types: Cigarettes, Cigars   Smokeless tobacco: Never  Vaping Use   Vaping status: Never Used  Substance and Sexual Activity   Alcohol use: Yes    Alcohol/week: 3.0 standard drinks of alcohol    Types: 3 Cans of beer per week    Comment: More than 4 glasses of wine, mixed drinks, beer daily.    Drug use: Not Currently    Comment: former use of marijuana   Sexual activity: Not Currently    Birth control/protection: None  Other Topics Concern   Not on file  Social History Narrative   Lives by herself    Social Drivers of Health   Financial Resource Strain: Low Risk  (10/13/2023)   Received from [facility]   Overall Financial Resource Strain (CARDIA)    Difficulty of Paying Living Expenses: Not very hard  Food Insecurity: No Food Insecurity (12/10/2023)   Hunger Vital Sign    Worried About Running Out of Food in the Last Year: Never true    Ran Out of Food in the Last Year: Never true  Recent Concern: Food Insecurity - Food Insecurity Present (09/27/2023)   Hunger Vital Sign    Worried About Running Out of Food in the Last Year: Sometimes true    Ran Out of Food in the Last Year: Sometimes true  Transportation Needs: No Transportation Needs (12/10/2023)   PRAPARE - Administrator, Civil Service (Medical): No    Lack of Transportation (Non-Medical): No  Recent Concern: Transportation Needs - Unmet Transportation Needs (09/27/2023)   PRAPARE - Administrator, Civil Service (Medical): Yes    Lack of Transportation (Non-Medical): Yes  Physical Activity: Not on file  Stress: Not on file  Social  Connections: Unknown (12/11/2023)   Social Connection and Isolation Panel    Frequency of Communication with Friends and Family: More than three times a week    Frequency of Social Gatherings with Friends and Family: More than three times a week    Attends Religious Services: Patient declined    Database administrator or Organizations: Patient declined    Attends Banker Meetings: Patient declined    Marital Status: Widowed  Recent Concern: Social Connections - Socially Isolated (09/28/2023)   Social Connection and Isolation Panel    Frequency of Communication with Friends and Family: Never    Frequency of Social Gatherings with Friends and Family: Never    Attends Religious Services: Never    Database administrator or Organizations: No    Attends Banker Meetings: Never    Marital Status: Widowed  Review of Systems  Genitourinary:  Positive for pelvic pain and vaginal bleeding.  Neurological:  Negative for dizziness.  Patient denies any headache, chest pain, shortness of breath, abdominal pain, N/V, or fever/chills. All other systems are negative.   Vital Signs: BP (!) 88/59 (BP Location: Right Arm)   Pulse 89   Temp 98.3 F (36.8 C)   Resp 18   Ht 5' 1 (1.549 m)   Wt 108 lb (49 kg)   SpO2 100%   BMI 20.41 kg/m    Physical Exam Constitutional:      Appearance: Normal appearance.  HENT:     Mouth/Throat:     Mouth: Mucous membranes are moist.     Pharynx: Oropharynx is clear.  Cardiovascular:     Rate and Rhythm: Normal rate and regular rhythm.  Pulmonary:     Effort: Pulmonary effort is normal.  Abdominal:     General: Abdomen is flat.     Palpations: Abdomen is soft.     Tenderness: There is abdominal tenderness (pelvic).  Musculoskeletal:        General: Normal range of motion.  Skin:    General: Skin is warm and dry.  Neurological:     General: No focal deficit present.     Mental Status: She is alert and oriented to person,  place, and time.  Psychiatric:        Mood and Affect: Mood normal.        Behavior: Behavior normal.     Imaging: DG Chest Port 1 View Result Date: 12/10/2023 CLINICAL DATA:  Infection. EXAM: PORTABLE CHEST 1 VIEW COMPARISON:  11/22/2023. FINDINGS: The heart size and mediastinal contours are unchanged. No focal consolidation, pleural effusion, or pneumothorax. No acute osseous abnormality. IMPRESSION: No acute cardiopulmonary findings. Electronically Signed   By: Harrietta Sherry M.D.   On: 12/10/2023 16:20   IR PATIENT EVAL TECH 0-60 MINS Result Date: 12/10/2023 Delight Bebe SAILOR     12/10/2023 12:32 PM Procedure cancelled order used for wasted supplies  DG Chest Portable 1 View Result Date: 11/22/2023 CLINICAL DATA:  Fatigue, hypotension EXAM: PORTABLE CHEST 1 VIEW COMPARISON:  09/28/2023 FINDINGS: Single frontal view of the chest demonstrates an unremarkable cardiac silhouette. No airspace disease, effusion, or pneumothorax. No acute bony abnormalities. IMPRESSION: 1. No acute intrathoracic process. Electronically Signed   By: Ozell Daring M.D.   On: 11/22/2023 15:44    Labs:  CBC: Recent Labs    12/11/23 0445 12/11/23 1644 12/12/23 0737 12/13/23 0409 12/14/23 0502  WBC 19.7*  --  16.0* 12.6* 13.1*  HGB 6.9* 9.2* 9.9* 10.0* 10.1*  HCT 22.3* 28.8* 31.5* 31.6* 31.9*  PLT 467*  --  506* 501* 575*    COAGS: Recent Labs    09/27/23 1459 11/04/23 1030 12/11/23 0445  INR 1.3* 1.1 1.4*  APTT  --  44*  --     BMP: Recent Labs    12/11/23 0445 12/12/23 0737 12/13/23 0409 12/14/23 0502  NA 137 135 134* 134*  K 3.5 3.1* 4.3 4.1  CL 110 104 100 98  CO2 24 26 26 25   GLUCOSE 89 104* 97 106*  BUN 9 <5* 8 7*  CALCIUM  7.4* 8.0* 8.5* 8.7*  CREATININE 0.31* 0.47 0.42* 0.38*  GFRNONAA >60 >60 >60 >60    LIVER FUNCTION TESTS: Recent Labs    11/22/23 1500 12/10/23 1153 12/10/23 1520 12/11/23 0445  BILITOT 0.5 0.6 0.5 0.5  AST 24 26 25 16   ALT 24 13  11 9  ALKPHOS  76 71 63 45  PROT 7.9 8.0 7.3 5.4*  ALBUMIN 2.8* 2.7* 2.3* 1.7*    TUMOR MARKERS: No results for input(s): AFPTM, CEA, CA199, CHROMGRNA in the last 8760 hours.  Assessment and Plan:  Endometrial Cancer: Valerie Leonard is a 68 y.o. female with a recent diagnosis of endometrial cancer initially scheduled for port-a-catheter insertion on 9/4. Unfortunately, the procedure was differed at that time due to patient being febrile. She was referred to the ED for further workup and was admitted due to concern for sepsis. She continues to improve and WBC continues to trend down, 13.1 today. She remains on IV flagyl . IR consulted for possible port-a-catheter insertion while inpatient.   -NPO at midnight -Will recheck CBC in morning -If WBC continue to trend down, IR will plan for tentative port-a-cathter insertion on 9/9 -Patient and team aware that should her WBC increase on 9/9, procedure will likely be postponed to Wednesday  Risks and benefits of image guided port-a-catheter placement was discussed with the patient including, but not limited to bleeding, infection, pneumothorax, or fibrin sheath development and need for additional procedures.  All of the patient's questions were answered, patient is agreeable to proceed. Consent signed and in chart.   Thank you for allowing our service to participate in KARY SUGRUE 's care.    Electronically Signed: Glennon CHRISTELLA Bal, PA-C   12/14/2023, 2:48 PM     I spent a total of 40 Minutes in face to face in clinical consultation, greater than 50% of which was counseling/coordinating care for port-a-catheter insertion.

## 2023-12-14 NOTE — Care Management Important Message (Signed)
 Important Message  Patient Details  Name: Valerie Leonard MRN: [id number] Date of Birth: Nov 29, 1955   Important Message Given:  Yes - Medicare IM     Tevon Berhane W, CMA 12/14/2023, 12:13 PM

## 2023-12-14 NOTE — Progress Notes (Signed)
 Mobility Specialist Progress Note:    12/14/23 0931  Mobility  Activity Ambulated with assistance;Pivoted/transferred from bed to chair  Level of Assistance Minimal assist, patient does 75% or more  Assistive Device Other (Comment) (HHA)  Distance Ambulated (ft) 6 ft  Range of Motion/Exercises Active;All extremities  Activity Response Tolerated well  Mobility visit 1 Mobility  Mobility Specialist Start Time (ACUTE ONLY) [id number]  Mobility Specialist Stop Time (ACUTE ONLY) 0931  Mobility Specialist Time Calculation (min) (ACUTE ONLY) 13 min   Pt received in bed, agreeable to mobility. MinA to stand and transfer w/ 1 person hand-held assist. Pt declined further ambulation d/t ambulating earlier this morning. Alarm on, belongings in reach. All needs met.  Sherrilee Ditty Mobility Specialist Please contact via Special educational needs teacher or  Rehab office at [phone]

## 2023-12-14 NOTE — Consult Note (Signed)
 Hematology/Oncology Consult note [facility] Telephone:([phone] [phone]  Patient Care Team: Pcp, No as PCP - General Maurie Rayfield BIRCH, RN as Oncology Nurse Navigator Melanee Annah BROCKS, MD as Consulting Physician (Oncology)   Name of the patient: Valerie Leonard  [id number]  September 24, 1955    Reason for consult: History of endometrial carcinoma admitted for possible sepsis   Requesting physician: Dr. 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  I had seen her 1 week ago in the clinic and was planning to start her on CarboTaxol chemotherapy next week.  She was supposed to get a port placed but was noted to have fever on that day and therefore sent to the cancer center.  As an outpatient she was noted to have an elevated white cell count of 23 hypotension and fever and therefore sent to the ER.  Patient is being treated for possible sepsis since and is currently on cefepime  and Flagyl .  Source of infection is unclear.  Urine culture and blood culture was negative.  Procalcitonin however was still elevated on 9/4 at 5.76.  White cell count did improve to 12.6 yesterday but was noted to be 13.1 today.  She is still somewhat hypotensive.  Lactic acid levels have normalized.  ECOG PS- 2  Pain scale- 0   Review of systems- Review of Systems  Constitutional:  Positive for malaise/fatigue. Negative for chills, fever and weight loss.  HENT:  Negative for congestion, ear discharge and nosebleeds.   Eyes:  Negative for blurred vision.  Respiratory:  Negative for cough, hemoptysis, sputum production, shortness of breath and wheezing.   Cardiovascular:  Negative for chest pain, palpitations, orthopnea and claudication.  Gastrointestinal:  Negative for abdominal pain, blood in stool, constipation, diarrhea, heartburn, melena, nausea  and vomiting.  Genitourinary:  Negative for dysuria, flank pain, frequency, hematuria and urgency.  Musculoskeletal:  Negative for back pain, joint pain and myalgias.  Skin:  Negative for rash.  Neurological:  Negative for dizziness, tingling, focal weakness, seizures, weakness and headaches.  Endo/Heme/Allergies:  Does not bruise/bleed easily.  Psychiatric/Behavioral:  Negative for depression and suicidal ideas. The patient does not have insomnia.     No Known Allergies  Patient Active Problem List   Diagnosis Date Noted   Sepsis (HCC) 12/10/2023   Endometrial cancer (HCC) 11/11/2023   Anemia 11/04/2023   ABLA (acute blood loss anemia) 09/28/2023   Pressure injury of skin 09/28/2023   Vaginal bleeding 09/27/2023   Severe major depression, single episode, without psychotic features (HCC) 07/26/2020   Depression    Acute delirium 07/25/2020   Acute metabolic encephalopathy    Hypomagnesemia    Chronic alcohol abuse    Hypokalemia 07/24/2020     Past Medical History:  Diagnosis Date   Acute delirium    Acute metabolic encephalopathy    Anemia    blood loss anemia   Chronic alcohol abuse    Depression    Endometrial cancer (HCC)    Hypokalemia    Hypomagnesemia    Pressure injury of skin    Severe major depression, single episode, without psychotic features (HCC)    Vaginal bleeding      Past Surgical History:  Procedure Laterality Date   IR PATIENT EVAL TECH 0-60 MINS  12/10/2023   NO PAST SURGERIES      Social History   Socioeconomic History  Marital status: Single    Spouse name: Not on file   Number of children: 0   Years of education: Not on file   Highest education level: Not on file  Occupational History   Not on file  Tobacco Use   Smoking status: Former    Types: Cigarettes, Cigars   Smokeless tobacco: Never  Vaping Use   Vaping status: Never Used  Substance and Sexual Activity   Alcohol use: Yes    Alcohol/week: 3.0 standard drinks of  alcohol    Types: 3 Cans of beer per week    Comment: More than 4 glasses of wine, mixed drinks, beer daily.    Drug use: Not Currently    Comment: former use of marijuana   Sexual activity: Not Currently    Birth control/protection: None  Other Topics Concern   Not on file  Social History Narrative   Lives by herself    Social Drivers of Health   Financial Resource Strain: Low Risk  (10/13/2023)   Received from [facility]   Overall Financial Resource Strain (CARDIA)    Difficulty of Paying Living Expenses: Not very hard  Food Insecurity: No Food Insecurity (12/10/2023)   Hunger Vital Sign    Worried About Running Out of Food in the Last Year: Never true    Ran Out of Food in the Last Year: Never true  Recent Concern: Food Insecurity - Food Insecurity Present (09/27/2023)   Hunger Vital Sign    Worried About Running Out of Food in the Last Year: Sometimes true    Ran Out of Food in the Last Year: Sometimes true  Transportation Needs: No Transportation Needs (12/10/2023)   PRAPARE - Administrator, Civil Service (Medical): No    Lack of Transportation (Non-Medical): No  Recent Concern: Transportation Needs - Unmet Transportation Needs (09/27/2023)   PRAPARE - Administrator, Civil Service (Medical): Yes    Lack of Transportation (Non-Medical): Yes  Physical Activity: Not on file  Stress: Not on file  Social Connections: Unknown (12/11/2023)   Social Connection and Isolation Panel    Frequency of Communication with Friends and Family: More than three times a week    Frequency of Social Gatherings with Friends and Family: More than three times a week    Attends Religious Services: Patient declined    Database administrator or Organizations: Patient declined    Attends Banker Meetings: Patient declined    Marital Status: Widowed  Recent Concern: Social Connections - Socially Isolated (09/28/2023)   Social Connection and Isolation  Panel    Frequency of Communication with Friends and Family: Never    Frequency of Social Gatherings with Friends and Family: Never    Attends Religious Services: Never    Database administrator or Organizations: No    Attends Banker Meetings: Never    Marital Status: Widowed  Intimate Partner Violence: Not At Risk (12/10/2023)   Humiliation, Afraid, Rape, and Kick questionnaire    Fear of Current or Ex-Partner: No    Emotionally Abused: No    Physically Abused: No    Sexually Abused: No     History reviewed. No pertinent family history.   Current Facility-Administered Medications:    acetaminophen  (TYLENOL ) tablet 650 mg, 650 mg, Oral, Q6H PRN, 650 mg at 12/14/23 1302 **OR** acetaminophen  (TYLENOL ) suppository 650 mg, 650 mg, Rectal, Q6H PRN, Sim Emery CROME, MD   ALPRAZolam  (XANAX )  tablet 0.25 mg, 0.25 mg, Oral, BID PRN, Trudy Anthony HERO, MD, 0.25 mg at 12/11/23 1832   cefTRIAXone  (ROCEPHIN ) 2 g in sodium chloride  0.9 % 100 mL IVPB, 2 g, Intravenous, Q24H, Trudy Anthony HERO, MD   diphenhydrAMINE  (BENADRYL ) capsule 50 mg, 50 mg, Oral, QHS, Trudy Anthony HERO, MD, 50 mg at 12/13/23 2042   docusate sodium  (COLACE) capsule 200 mg, 200 mg, Oral, BID, Trudy Anthony HERO, MD, 200 mg at 12/14/23 9080   escitalopram  (LEXAPRO ) tablet 5 mg, 5 mg, Oral, Daily, Trudy Anthony HERO, MD, 5 mg at 12/14/23 0919   feeding supplement (ENSURE PLUS HIGH PROTEIN) liquid 237 mL, 237 mL, Oral, BID BM, Sim, Mohammad L, MD, 237 mL at 12/12/23 9082   iohexol  (OMNIPAQUE ) 9 MG/ML oral solution 500 mL, 500 mL, Oral, Q1H, Melanee Annah BROCKS, MD   iron  sucrose (VENOFER ) 200 mg in sodium chloride  0.9 % 100 mL IVPB, 200 mg, Intravenous, Once, Trudy Anthony HERO, MD   lactulose  (CHRONULAC ) 10 GM/15ML solution 20 g, 20 g, Oral, BID, Trudy Anthony HERO, MD, 20 g at 12/14/23 1302   metroNIDAZOLE  (FLAGYL ) IVPB 500 mg, 500 mg, Intravenous, Q12H, Sim Re L, MD, Last Rate: 100 mL/hr at 12/14/23  0920, 500 mg at 12/14/23 0920   ondansetron  (ZOFRAN ) tablet 4 mg, 4 mg, Oral, Q6H PRN **OR** ondansetron  (ZOFRAN ) injection 4 mg, 4 mg, Intravenous, Q6H PRN, Sim, Mohammad L, MD, 4 mg at 12/14/23 1128   polyethylene glycol (MIRALAX  / GLYCOLAX ) packet 17 g, 17 g, Oral, Daily PRN, Trudy Anthony HERO, MD, 17 g at 12/13/23 1030   Physical exam:  Vitals:   12/13/23 1550 12/13/23 2011 12/14/23 0359 12/14/23 0822  BP: 110/60 101/74 95/66 (!) 88/59  Pulse:  93 86 89  Resp: 18 16 16 18   Temp: 98 F (36.7 C) 97.8 F (36.6 C) 97.9 F (36.6 C) 98.3 F (36.8 C)  TempSrc: Oral     SpO2: 98% 99% 100% 100%  Weight:      Height:       Physical Exam Constitutional:      Comments: Frail appearing  Cardiovascular:     Rate and Rhythm: Normal rate and regular rhythm.     Heart sounds: Normal heart sounds.  Pulmonary:     Effort: Pulmonary effort is normal.     Breath sounds: Normal breath sounds.  Abdominal:     General: Bowel sounds are normal.     Palpations: Abdomen is soft.  Skin:    General: Skin is warm and dry.  Neurological:     Mental Status: She is alert and oriented to person, place, and time.           Latest Ref Rng & Units 12/14/2023    5:02 AM  CMP  Glucose 70 - 99 mg/dL 893   BUN 8 - 23 mg/dL 7   Creatinine 9.55 - 8.99 mg/dL 9.61   Sodium 864 - 854 mmol/L 134   Potassium 3.5 - 5.1 mmol/L 4.1   Chloride 98 - 111 mmol/L 98   CO2 22 - 32 mmol/L 25   Calcium  8.9 - 10.3 mg/dL 8.7       Latest Ref Rng & Units 12/14/2023    5:02 AM  CBC  WBC 4.0 - 10.5 K/uL 13.1   Hemoglobin 12.0 - 15.0 g/dL 89.8   Hematocrit 63.9 - 46.0 % 31.9   Platelets 150 - 400 K/uL 575     @IMAGES @  DG Chest [facility] [address]  Result Date: 12/10/2023 CLINICAL DATA:  Infection. EXAM: PORTABLE CHEST 1 VIEW COMPARISON:  11/22/2023. FINDINGS: The heart size and mediastinal contours are unchanged. No focal consolidation, pleural effusion, or pneumothorax. No acute osseous abnormality. IMPRESSION: No  acute cardiopulmonary findings. Electronically Signed   By: Harrietta Sherry M.D.   On: 12/10/2023 16:20   IR PATIENT EVAL TECH 0-60 MINS Result Date: 12/10/2023 Delight Bebe SAILOR     12/10/2023 12:32 PM Procedure cancelled order used for wasted supplies  DG Chest Portable 1 View Result Date: 11/22/2023 CLINICAL DATA:  Fatigue, hypotension EXAM: PORTABLE CHEST 1 VIEW COMPARISON:  09/28/2023 FINDINGS: Single frontal view of the chest demonstrates an unremarkable cardiac silhouette. No airspace disease, effusion, or pneumothorax. No acute bony abnormalities. IMPRESSION: 1. No acute intrathoracic process. Electronically Signed   By: Ozell Daring M.D.   On: 11/22/2023 15:44    Assessment and plan- Patient is a 68 y.o. female with newly diagnosed stage IIIc endometrioid endometrial carcinoma not yet started on chemotherapy admitted for sepsis possibly secondary to UTI  Severe sepsis: Source of infection is unclear.  She was noted to be hypotensive and febrile in the clinic as an outpatient along with leukocytosis.  She is presently on IV antibiotics and white blood cell count was trending down until yesterday but mildly higher today.  Blood culture and urine cultures so far have been negative.  Repeat CT abdomen has been ordered by primary team and is currently pending.BP intermittently is still in the 80s but patient feels much improved since admission  I will see if her white count is better tomorrow and if she can get a port placement as an inpatient.  I am hoping to start chemotherapy in about a week's time.  Chemo will not start until acute issues have resolved.  Patient noted to have evidence of iron  deficiency and we will see if we can give her the first IV iron  while she is in the hospital.  She was supposed to get as an outpatient.    Visit Diagnosis 1. Severe sepsis (HCC)     Dr. Annah Skene, MD, MPH [facility] [phone] 12/14/2023

## 2023-12-14 NOTE — Plan of Care (Signed)
 The patient continues on IV Flagyl  and IV Cefepime  for UTI w/o s/s of adverse effects noted. Blood noted in urine in external cathter. Vital signs are stable. No s/s of acute distress noted.

## 2023-12-14 NOTE — Progress Notes (Signed)
 PROGRESS NOTE    Valerie Leonard  FMW:[id number] DOB: [birth year] DOA: 12/10/2023 PCP: Pcp, No    Assessment & Plan:   Principal Problem:   Sepsis (HCC) Active Problems:   Chronic alcohol abuse   Severe major depression, single episode, without psychotic features (HCC)   Anemia   Endometrial cancer (HCC)  Assessment and Plan: Severe sepsis: see how pt met severe sepsis criteria as per Dr. Sherrilee note.  No obvious source of her sepsis. Blood cxs NGTD. Urine cx shows no growth. CXR was neg. RSV, influenza, & COVID19 are all neg. Continue on IV flagyl  and start IV rocephin  & d/c IV cefepime  w/ acute encephalopathy. D/c IV vanco. WBC is labile. CT abd/pelvis to access for uterine infection.    Endometrial cancer: stage III. Oncologist is Dr. Melanee. Still w/ vaginal bleeding. Will have chemo port placed tomorrow as per onco. Onco consulted   ACD: secondary to endometrial bleed. S/p 1 unit of pRBCs transfused so far. H&H are stable.   Hx of alcohol abuse: remote hx. Will continue to monitor     Acute metabolic encephalopathy: likely secondary to possible infection. Mental status waxes and wanes   Hypotension: episode of hypotension while walking w/ therapy on 12/13/23. S/p NS bolus x1. Keep MAP >65    Thrombocytosis: likely reactive. Will continue to monitor       DVT prophylaxis: SCDs Code Status: full  Family Communication:  Disposition Plan: HH vs SNF  Level of care: Telemetry Medical  Status is: Inpatient Remains inpatient appropriate because: severity of illness, requiring IV abxs    Consultants:    Procedures:   Antimicrobials: rocephin , flagyl    Subjective: Pt c/o b/l leg pain   Objective: Vitals:   12/13/23 1550 12/13/23 2011 12/14/23 0359 12/14/23 0822  BP: 110/60 101/74 95/66 (!) 88/59  Pulse:  93 86 89  Resp: 18 16 16 18   Temp: 98 F (36.7 C) 97.8 F (36.6 C) 97.9 F (36.6 C) 98.3 F (36.8 C)  TempSrc: Oral     SpO2: 98% 99% 100% 100%  Weight:       Height:        Intake/Output Summary (Last 24 hours) at 12/14/2023 0909 Last data filed at 12/14/2023 9178 Gross per 24 hour  Intake 1040.14 ml  Output 1600 ml  Net -559.86 ml   Filed Weights   12/10/23 1517  Weight: 49 kg    Examination:  General exam: appears uncomfortable   Respiratory system: clear breath sounds b/l Cardiovascular system: S1/S2+. No rubs or clicks  Gastrointestinal system: abd is soft, NT, ND & hypoactive bowel sounds  Central nervous system: alert & awake.  Psychiatry: Judgement and insight appears not at baseline. Flat mood and affect    Data Reviewed: I have personally reviewed following labs and imaging studies  CBC: Recent Labs  Lab 12/10/23 1153 12/10/23 1520 12/11/23 0445 12/11/23 1644 12/12/23 0737 12/13/23 0409 12/14/23 0502  WBC 25.9* 27.1* 19.7*  --  16.0* 12.6* 13.1*  NEUTROABS 23.0* 23.3*  --   --   --   --   --   HGB 9.9* 8.7* 6.9* 9.2* 9.9* 10.0* 10.1*  HCT 32.0* 28.2* 22.3* 28.8* 31.5* 31.6* 31.9*  MCV 82.9 83.7 84.5  --  82.9 83.6 82.6  PLT 679* 611* 467*  --  506* 501* 575*   Basic Metabolic Panel: Recent Labs  Lab 12/10/23 1520 12/11/23 0445 12/12/23 0737 12/13/23 0409 12/14/23 0502  NA 135 137 135 134* 134*  K 3.6 3.5 3.1* 4.3 4.1  CL 104 110 104 100 98  CO2 22 24 26 26 25   GLUCOSE 143* 89 104* 97 106*  BUN 12 9 <5* 8 7*  CREATININE 0.52 0.31* 0.47 0.42* 0.38*  CALCIUM  7.9* 7.4* 8.0* 8.5* 8.7*   GFR: Estimated Creatinine Clearance: 51.5 mL/min (A) (by C-G formula based on SCr of 0.38 mg/dL (L)). Liver Function Tests: Recent Labs  Lab 12/10/23 1153 12/10/23 1520 12/11/23 0445  AST 26 25 16   ALT 13 11 9   ALKPHOS 71 63 45  BILITOT 0.6 0.5 0.5  PROT 8.0 7.3 5.4*  ALBUMIN 2.7* 2.3* 1.7*   No results for input(s): LIPASE, AMYLASE in the last 168 hours. No results for input(s): AMMONIA in the last 168 hours. Coagulation Profile: Recent Labs  Lab 12/11/23 0445  INR 1.4*   Cardiac Enzymes: No  results for input(s): CKTOTAL, CKMB, CKMBINDEX, TROPONINI in the last 168 hours. BNP (last 3 results) No results for input(s): PROBNP in the last 8760 hours. HbA1C: No results for input(s): HGBA1C in the last 72 hours. CBG: No results for input(s): GLUCAP in the last 168 hours. Lipid Profile: No results for input(s): CHOL, HDL, LDLCALC, TRIG, CHOLHDL, LDLDIRECT in the last 72 hours. Thyroid  Function Tests: No results for input(s): TSH, T4TOTAL, FREET4, T3FREE, THYROIDAB in the last 72 hours. Anemia Panel: No results for input(s): VITAMINB12, FOLATE, FERRITIN, TIBC, IRON , RETICCTPCT in the last 72 hours. Sepsis Labs: Recent Labs  Lab 12/10/23 1520 12/10/23 2040  PROCALCITON 5.76  --   LATICACIDVEN 2.2* 1.3    Recent Results (from the past 240 hours)  Blood culture (routine x 2)     Status: None (Preliminary result)   Collection Time: 12/10/23  3:20 PM   Specimen: BLOOD  Result Value Ref Range Status   Specimen Description BLOOD RIGHT ANTECUBITAL  Final   Special Requests   Final    BOTTLES DRAWN AEROBIC AND ANAEROBIC Blood Culture adequate volume   Culture   Final    NO GROWTH 4 DAYS Performed at [facility], [address] [place], [place] [address]    Report Status PENDING  Incomplete  Resp panel by RT-PCR (RSV, Flu A&B, Covid) Anterior Nasal Swab     Status: None   Collection Time: 12/10/23  4:25 PM   Specimen: Anterior Nasal Swab  Result Value Ref Range Status   SARS Coronavirus 2 by RT PCR NEGATIVE NEGATIVE Final    Comment: (NOTE) SARS-CoV-2 target nucleic acids are NOT DETECTED.  The SARS-CoV-2 RNA is generally detectable in upper respiratory specimens during the acute phase of infection. The lowest concentration of SARS-CoV-2 viral copies this assay can detect is 138 copies/mL. A negative result does not preclude SARS-Cov-2 infection and should not be used as the sole basis for treatment or other  patient management decisions. A negative result may occur with  improper specimen collection/handling, submission of specimen other than nasopharyngeal swab, presence of viral mutation(s) within the areas targeted by this assay, and inadequate number of viral copies(<138 copies/mL). A negative result must be combined with clinical observations, patient history, and epidemiological information. The expected result is Negative.  Fact Sheet for Patients:  BloggerCourse.com  Fact Sheet for Healthcare Providers:  SeriousBroker.it  This test is no t yet approved or cleared by the [place]  FDA and  has been authorized for detection and/or diagnosis of SARS-CoV-2 by FDA under an Emergency Use Authorization (EUA). This EUA will remain  in effect (meaning  this test can be used) for the duration of the COVID-19 declaration under Section 564(b)(1) of the Act, 21 U.S.C.section 360bbb-3(b)(1), unless the authorization is terminated  or revoked sooner.       Influenza A by PCR NEGATIVE NEGATIVE Final   Influenza B by PCR NEGATIVE NEGATIVE Final    Comment: (NOTE) The Xpert Xpress SARS-CoV-2/FLU/RSV plus assay is intended as an aid in the diagnosis of influenza from Nasopharyngeal swab specimens and should not be used as a sole basis for treatment. Nasal washings and aspirates are unacceptable for Xpert Xpress SARS-CoV-2/FLU/RSV testing.  Fact Sheet for Patients: BloggerCourse.com  Fact Sheet for Healthcare Providers: SeriousBroker.it  This test is not yet approved or cleared by the [place]  FDA and has been authorized for detection and/or diagnosis of SARS-CoV-2 by FDA under an Emergency Use Authorization (EUA). This EUA will remain in effect (meaning this test can be used) for the duration of the COVID-19 declaration under Section 564(b)(1) of the Act, 21 U.S.C. section  360bbb-3(b)(1), unless the authorization is terminated or revoked.     Resp Syncytial Virus by PCR NEGATIVE NEGATIVE Final    Comment: (NOTE) Fact Sheet for Patients: BloggerCourse.com  Fact Sheet for Healthcare Providers: SeriousBroker.it  This test is not yet approved or cleared by the [place]  FDA and has been authorized for detection and/or diagnosis of SARS-CoV-2 by FDA under an Emergency Use Authorization (EUA). This EUA will remain in effect (meaning this test can be used) for the duration of the COVID-19 declaration under Section 564(b)(1) of the Act, 21 U.S.C. section 360bbb-3(b)(1), unless the authorization is terminated or revoked.  Performed at [facility], [address] [place], [place] [address]   Urine Culture     Status: None   Collection Time: 12/10/23  4:25 PM   Specimen: Urine, Clean Catch  Result Value Ref Range Status   Specimen Description   Final    URINE, CLEAN CATCH Performed at [facility], [address] [place], [place] [address]    Special Requests   Final    NONE Performed at [facility], [address] [place], [place] [address]    Culture   Final    NO GROWTH Performed at [facility], [address] [place], [place] [address]    Report Status 12/11/2023 FINAL  Final  Blood culture (routine x 2)     Status: None (Preliminary result)   Collection Time: 12/10/23  8:41 PM   Specimen: BLOOD  Result Value Ref Range Status   Specimen Description BLOOD BLOOD LEFT HAND [facility]  Final   Special Requests   Final    BOTTLES DRAWN AEROBIC AND ANAEROBIC Blood Culture adequate volume   Culture   Final    NO GROWTH 4 DAYS Performed at [facility], [address], [place], [place] [address]    Report Status PENDING  Incomplete  MRSA Next Gen by PCR, Nasal     Status: None   Collection Time: 12/12/23 10:28 AM   Specimen: Nasal Mucosa; Nasal Swab   Result Value Ref Range Status   MRSA by PCR Next Gen NOT DETECTED NOT DETECTED Final    Comment: (NOTE) The GeneXpert MRSA Assay (FDA approved for NASAL specimens only), is one component of a comprehensive MRSA colonization surveillance program. It is not intended to diagnose MRSA infection nor to guide or monitor treatment for MRSA infections. Test performance is not FDA approved in patients less than 11 years old.  Performed at [facility], [address] [place], [place] [address]          Radiology Studies: No results found.       Scheduled Meds:  diphenhydrAMINE   50 mg Oral QHS   docusate sodium   200 mg Oral BID   escitalopram   5 mg Oral Daily   feeding supplement  237 mL Oral BID BM   Continuous Infusions:  ceFEPime  (MAXIPIME ) IV 2 g (12/13/23 2010)   metronidazole  Stopped (12/13/23 2306)     LOS: 4 days      Anthony CHRISTELLA Pouch, MD [facility] Pager [phone]  If 7PM-7AM, please contact night-coverage [email or web address] 12/14/2023, 9:09 AM

## 2023-12-14 NOTE — Progress Notes (Addendum)
   12/14/23 1000  Spiritual Encounters  Type of Visit Initial  Care provided to: Patient  [place]in[place] partners present during Programmer, systems;Other (comment) Training and development officer)  Referral source Patient request  Reason for visit Routine spiritual support  OnCall Visit Yes   Chaplain visited patient per a [place] Consult in the EPIC system.  Patient shared about grief, loss and new health diagnosis.  Patient identified herself as experiencing signs of dementia.  Patient said chemo has been discussed with her; however, Chaplain assessed patient may be making this choice because it seems like the right thing to do.  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  Patient finds meaning in her faith and familial relationships.  Patient asked for prayer and Chaplain prayed with the patient.  Patient was appreciative of the visit.    Rev. Rana M. Nicholaus, M.Div. Chaplain Resident [facility]

## 2023-12-14 NOTE — Progress Notes (Signed)
 Physical Therapy Treatment Patient Details Name: Valerie Leonard MRN: [id number] DOB: 12-Mar-1956 Today's Date: 12/14/2023   History of Present Illness Patient is a 68 year old female with sepsis. PMH: endometrial cancer, alcohol abuse, acute metabolic encephalopathy    PT Comments  Pt received in bed agreeable to PT interventions. Pt in good spirits. PT provided bed mobility and gait training with Min assist. CGA for gait training. Pt demonstrates toes walking and Heel strike absent. Extremely tight Calf muscles. Pt ambulated 80 ft with FWW with heavy reliance on FWW and VC fro hand placement and proper use of the Walker. Pt iin chair for super. Pt tol tx well. Current POC is appropriate.    If plan is discharge home, recommend the following: A little help with walking and/or transfers;A lot of help with bathing/dressing/bathroom;Assistance with cooking/housework;Assist for transportation;Help with stairs or ramp for entrance   Can travel by private vehicle     No  Equipment Recommendations       Recommendations for Other Services       Precautions / Restrictions Precautions Precautions: Fall Recall of Precautions/Restrictions: Impaired Restrictions Weight Bearing Restrictions Per Provider Order: No     Mobility  Bed Mobility Overal bed mobility: Needs Assistance Bed Mobility: Supine to Sit     Supine to sit: Min assist     General bed mobility comments: to BLE and pt left in chair    Transfers Overall transfer level: Needs assistance Equipment used: Rolling walker (2 wheels) Transfers: Sit to/from Stand, Bed to chair/wheelchair/BSC Sit to Stand: Contact guard assist   Step pivot transfers: Contact guard assist       General transfer comment: verbal cues for hand placement, technique. several standing bouts performed from various surfaces including bed, recliner, and chair with no arms    Ambulation/Gait Ambulation/Gait assistance: Contact guard assist, Min  assist Gait Distance (Feet): 80 Feet Assistive device: Rolling walker (2 wheels) Gait Pattern/deviations: Step-through pattern, Decreased step length - right, Decreased step length - left Gait velocity: decreased     General Gait Details: walks on toes, relies heavily on RW and needs VC to use AD appropriately. and for Hnad placment.   Stairs             Wheelchair Mobility     Tilt Bed    Modified Rankin (Stroke Patients Only)       Balance Overall balance assessment: Needs assistance Sitting-balance support: Feet supported Sitting balance-Leahy Scale: Good Sitting balance - Comments: No LOB noted   Standing balance support: Bilateral upper extremity supported Standing balance-Leahy Scale: Good Standing balance comment: No LOB noted and needs FWW.                            Communication Communication Communication: No apparent difficulties  Cognition Arousal: Alert Behavior During Therapy: WFL for tasks assessed/performed   PT - Cognitive impairments: Safety/Judgement, Awareness                       PT - Cognition Comments: patient overestimates abilities, decreased awareness of physical limitations, need for rest breaks, etc. Following commands: Impaired Following commands impaired: Follows multi-step commands with increased time    Cueing Cueing Techniques: Verbal cues  Exercises      General Comments        Pertinent Vitals/Pain Pain Assessment Pain Assessment: No/denies pain    Home Living  Prior Function            PT Goals (current goals can now be found in the care plan section) Acute Rehab PT Goals Patient Stated Goal: to go home Progress towards PT goals: Progressing toward goals    Frequency    Min 2X/week      PT Plan      Co-evaluation              AM-PAC PT 6 Clicks Mobility   Outcome Measure  Help needed turning from your back to your side while in  a flat bed without using bedrails?: A Little Help needed moving from lying on your back to sitting on the side of a flat bed without using bedrails?: A Little Help needed moving to and from a bed to a chair (including a wheelchair)?: A Little Help needed standing up from a chair using your arms (e.g., wheelchair or bedside chair)?: A Little Help needed to walk in hospital room?: A Little Help needed climbing 3-5 steps with a railing? : Total 6 Click Score: 16    End of Session Equipment Utilized During Treatment: Gait belt Activity Tolerance: Patient tolerated treatment well Patient left: in chair;with call bell/phone within reach;with chair alarm set Nurse Communication: Mobility status PT Visit Diagnosis: Muscle weakness (generalized) (M62.81);Unsteadiness on feet (R26.81)     Time: 9648-9579 PT Time Calculation (min) (ACUTE ONLY): 29 min  Charges:    $Gait Training: 8-22 mins $Therapeutic Activity: 8-22 mins PT General Charges $$ ACUTE PT VISIT: 1 Visit                    Ronita Oris PT DPT 4:57 PM,12/14/23

## 2023-12-15 ENCOUNTER — Inpatient Hospital Stay

## 2023-12-15 ENCOUNTER — Other Ambulatory Visit

## 2023-12-15 DIAGNOSIS — D75839 Thrombocytosis, unspecified: Secondary | ICD-10-CM

## 2023-12-15 DIAGNOSIS — C541 Malignant neoplasm of endometrium: Secondary | ICD-10-CM | POA: Diagnosis not present

## 2023-12-15 DIAGNOSIS — R509 Fever, unspecified: Secondary | ICD-10-CM

## 2023-12-15 DIAGNOSIS — D72829 Elevated white blood cell count, unspecified: Secondary | ICD-10-CM

## 2023-12-15 LAB — CBC
HCT: 31.7 % — ABNORMAL LOW (ref 36.0–46.0)
Hemoglobin: 9.9 g/dL — ABNORMAL LOW (ref 12.0–15.0)
MCH: 26.1 pg (ref 26.0–34.0)
MCHC: 31.2 g/dL (ref 30.0–36.0)
MCV: 83.6 fL (ref 80.0–100.0)
Platelets: 607 K/uL — ABNORMAL HIGH (ref 150–400)
RBC: 3.79 MIL/uL — ABNORMAL LOW (ref 3.87–5.11)
RDW: 17.8 % — ABNORMAL HIGH (ref 11.5–15.5)
WBC: 16.9 K/uL — ABNORMAL HIGH (ref 4.0–10.5)
nRBC: 0 % (ref 0.0–0.2)

## 2023-12-15 LAB — BASIC METABOLIC PANEL WITH GFR
Anion gap: 11 (ref 5–15)
BUN: 7 mg/dL — ABNORMAL LOW (ref 8–23)
CO2: 23 mmol/L (ref 22–32)
Calcium: 8.3 mg/dL — ABNORMAL LOW (ref 8.9–10.3)
Chloride: 100 mmol/L (ref 98–111)
Creatinine, Ser: 0.38 mg/dL — ABNORMAL LOW (ref 0.44–1.00)
GFR, Estimated: >60 mL/min (ref >60)
Glucose, Bld: 90 mg/dL (ref 70–99)
Potassium: 3.6 mmol/L (ref 3.5–5.1)
Sodium: 134 mmol/L — ABNORMAL LOW (ref 135–145)

## 2023-12-15 LAB — PROCALCITONIN: Procalcitonin: 1.85 ng/mL

## 2023-12-15 MED ORDER — ENOXAPARIN SODIUM 40 MG/0.4ML IJ SOSY
40.0000 mg | PREFILLED_SYRINGE | INTRAMUSCULAR | Status: DC
  Administered 2023-12-15: 40 mg via SUBCUTANEOUS
  Filled 2023-12-15: qty 0.4

## 2023-12-15 NOTE — Consult Note (Signed)
 NAME: TAKINA BUSSER  DOB: [birth year]  MRN: [id number]  Date/Time: 12/15/2023 3:34 PM  REQUESTING PROVIDER: Dr.Williams Subjective:  REASON FOR CONSULT: Leucocytosis ? Valerie Leonard is a 68 y.o. female with a history of Endometroid endometrial carcinoma  r, diagnosed after post menopausal bleeding and hospitalization for severe anemia , waiting to start chemoradiation had gone for PORT placement on 9/4 when she was notied to have fever and leucocytosis and it was postponed and she was seen by  oncology who sent her to ED. In the Oncology office  BP: (!) 84/66  Pulse: (!) 137  Resp: 18  Temp: (!) 101.1 F (38.4 C)  TempSrc: Oral  SpO2: 96%      Latest Ref Rng & Units 12/10/2023   11:53 AM  CBC  WBC 4.0 - 10.5 K/uL 25.9   Hemoglobin 12.0 - 15.0 g/dL 9.9   Hematocrit 63.9 - 46.0 % 32.0   Platelets 150 - 400 K/uL 679    She was taking antibiotics for a UTI though she did not c/o of any urinary symptoms, was sent to ED for further management  12/10/23 12:24  BP 84/66 (L)  Temp 101.1 F (38.4 C) !  Pulse Rate 137 !  Resp 18  SpO2 96 %    Latest Reference Range & Units 12/10/23 15:20  WBC 4.0 - 10.5 K/uL 27.1 (H)  Hemoglobin 12.0 - 15.0 g/dL 8.7 (L)  HCT 63.9 - 53.9 % 28.2 (L)  Platelets 150 - 400 K/uL 611 (H)  Creatinine 0.44 - 1.00 mg/dL 9.47   Blood culture sent and started on flagyl , cefepime  and vanco UA 0-5 wbc CXR N COVID neg Ct without contrast  uterine mass Pt is currently on ceftriaxone  and flagyl   I am asked to see him for persistent leucocytosis upset that she is still in the hospital She wants to go home Her mom died recently and she was in the hospital and upset about it She does not have any cough or cold or shortness of breath or pain abdomen No pain abdomen    Past Medical History:  Diagnosis Date   Acute delirium    Acute metabolic encephalopathy    Anemia    blood loss anemia   Chronic alcohol abuse    Depression    Endometrial cancer (HCC)     Hypokalemia    Hypomagnesemia    Pressure injury of skin    Severe major depression, single episode, without psychotic features (HCC)    Vaginal bleeding     Past Surgical History:  Procedure Laterality Date   IR PATIENT EVAL TECH 0-60 MINS  12/10/2023   NO PAST SURGERIES      Social History   Socioeconomic History   Marital status: Single    Spouse name: Not on file   Number of children: 0   Years of education: Not on file   Highest education level: Not on file  Occupational History   Not on file  Tobacco Use   Smoking status: Former    Types: Cigarettes, Cigars   Smokeless tobacco: Never  Vaping Use   Vaping status: Never Used  Substance and Sexual Activity   Alcohol use: Yes    Alcohol/week: 3.0 standard drinks of alcohol    Types: 3 Cans of beer per week    Comment: More than 4 glasses of wine, mixed drinks, beer daily.    Drug use: Not Currently    Comment: former use of marijuana   Sexual  activity: Not Currently    Birth control/protection: None  Other Topics Concern   Not on file  Social History Narrative   Lives by herself    Social Drivers of Health   Financial Resource Strain: Low Risk  (10/13/2023)   Received from [facility]   Overall Financial Resource Strain (CARDIA)    Difficulty of Paying Living Expenses: Not very hard  Food Insecurity: No Food Insecurity (12/10/2023)   Hunger Vital Sign    Worried About Running Out of Food in the Last Year: Never true    Ran Out of Food in the Last Year: Never true  Recent Concern: Food Insecurity - Food Insecurity Present (09/27/2023)   Hunger Vital Sign    Worried About Running Out of Food in the Last Year: Sometimes true    Ran Out of Food in the Last Year: Sometimes true  Transportation Needs: No Transportation Needs (12/10/2023)   PRAPARE - Administrator, Civil Service (Medical): No    Lack of Transportation (Non-Medical): No  Recent Concern: Transportation Needs - Unmet  Transportation Needs (09/27/2023)   PRAPARE - Administrator, Civil Service (Medical): Yes    Lack of Transportation (Non-Medical): Yes  Physical Activity: Not on file  Stress: Not on file  Social Connections: Unknown (12/11/2023)   Social Connection and Isolation Panel    Frequency of Communication with Friends and Family: More than three times a week    Frequency of Social Gatherings with Friends and Family: More than three times a week    Attends Religious Services: Patient declined    Database administrator or Organizations: Patient declined    Attends Banker Meetings: Patient declined    Marital Status: Widowed  Recent Concern: Social Connections - Socially Isolated (09/28/2023)   Social Connection and Isolation Panel    Frequency of Communication with Friends and Family: Never    Frequency of Social Gatherings with Friends and Family: Never    Attends Religious Services: Never    Database administrator or Organizations: No    Attends Banker Meetings: Never    Marital Status: Widowed  Intimate Partner Violence: Not At Risk (12/10/2023)   Humiliation, Afraid, Rape, and Kick questionnaire    Fear of Current or Ex-Partner: No    Emotionally Abused: No    Physically Abused: No    Sexually Abused: No    Sister breast cancer No Known Allergies I? Current Facility-Administered Medications  Medication Dose Route Frequency Provider Last Rate Last Admin   acetaminophen  (TYLENOL ) tablet 650 mg  650 mg Oral Q6H PRN Garba, Mohammad L, MD   650 mg at 12/14/23 1302   Or   acetaminophen  (TYLENOL ) suppository 650 mg  650 mg Rectal Q6H PRN Sim Emery CROME, MD       ALPRAZolam  (XANAX ) tablet 0.25 mg  0.25 mg Oral BID PRN Trudy Anthony HERO, MD   0.25 mg at 12/11/23 1832   cefTRIAXone  (ROCEPHIN ) 2 g in sodium chloride  0.9 % 100 mL IVPB  2 g Intravenous Q24H Trudy Anthony HERO, MD 200 mL/hr at 12/14/23 2337 2 g at 12/14/23 2337   diphenhydrAMINE  (BENADRYL )  capsule 50 mg  50 mg Oral QHS Trudy Anthony HERO, MD   50 mg at 12/14/23 2206   docusate sodium  (COLACE) capsule 200 mg  200 mg Oral BID Trudy Anthony HERO, MD   200 mg at 12/14/23 2206   enoxaparin  (LOVENOX ) injection 40 mg  40 mg Subcutaneous Q24H Trudy Anthony HERO, MD   40 mg at 12/15/23 1520   escitalopram  (LEXAPRO ) tablet 5 mg  5 mg Oral Daily Trudy Anthony HERO, MD   5 mg at 12/15/23 1146   feeding supplement (ENSURE PLUS HIGH PROTEIN) liquid 237 mL  237 mL Oral BID BM Sim Re L, MD   237 mL at 12/15/23 1438   lactulose  (CHRONULAC ) 10 GM/15ML solution 20 g  20 g Oral BID Trudy Anthony HERO, MD   20 g at 12/14/23 2206   metroNIDAZOLE  (FLAGYL ) IVPB 500 mg  500 mg Intravenous Q12H Sim Re CROME, MD   Stopped at 12/15/23 1155   ondansetron  (ZOFRAN ) tablet 4 mg  4 mg Oral Q6H PRN Sim Re CROME, MD       Or   ondansetron  (ZOFRAN ) injection 4 mg  4 mg Intravenous Q6H PRN Sim Re CROME, MD   4 mg at 12/15/23 0524   polyethylene glycol (MIRALAX  / GLYCOLAX ) packet 17 g  17 g Oral Daily PRN Trudy Anthony HERO, MD   17 g at 12/13/23 1030     Abtx:  Anti-infectives (From admission, onward)    Start     Dose/Rate Route Frequency Ordered Stop   12/14/23 2200  cefTRIAXone  (ROCEPHIN ) 2 g in sodium chloride  0.9 % 100 mL IVPB        2 g 200 mL/hr over 30 Minutes Intravenous Every 24 hours 12/14/23 1516     12/11/23 1830  vancomycin  (VANCOREADY) IVPB 750 mg/150 mL  Status:  Discontinued        750 mg 150 mL/hr over 60 Minutes Intravenous Every 24 hours 12/10/23 1929 12/12/23 1517   12/10/23 2200  metroNIDAZOLE  (FLAGYL ) IVPB 500 mg        500 mg 100 mL/hr over 60 Minutes Intravenous Every 12 hours 12/10/23 1907 12/17/23 2159   12/10/23 2200  ceFEPIme  (MAXIPIME ) 2 g in sodium chloride  0.9 % 100 mL IVPB  Status:  Discontinued        2 g 200 mL/hr over 30 Minutes Intravenous Every 12 hours 12/10/23 1910 12/14/23 1516   12/10/23 1915  ceFEPIme  (MAXIPIME ) 2 g in sodium chloride   0.9 % 100 mL IVPB  Status:  Discontinued        2 g 200 mL/hr over 30 Minutes Intravenous  Once 12/10/23 1907 12/10/23 1910   12/10/23 1915  vancomycin  (VANCOCIN ) IVPB 1000 mg/200 mL premix  Status:  Discontinued        1,000 mg 200 mL/hr over 60 Minutes Intravenous  Once 12/10/23 1907 12/10/23 1909   12/10/23 1730  piperacillin -tazobactam (ZOSYN ) IVPB 3.375 g        3.375 g 100 mL/hr over 30 Minutes Intravenous  Once 12/10/23 1719 12/10/23 1848   12/10/23 1730  vancomycin  (VANCOREADY) IVPB 1250 mg/250 mL        1,250 mg 166.7 mL/hr over 90 Minutes Intravenous  Once 12/10/23 1719 12/10/23 1946       REVIEW OF SYSTEMS:  Const: negative fever, negative chills, + weight loss Eyes: negative diplopia or visual changes, negative eye pain ENT: negative coryza, negative sore throat Resp: negative cough, hemoptysis, dyspnea Cards: negative for chest pain, palpitations, lower extremity edema GU: negative for frequency, dysuria and hematuria GI: Negative for abdominal pain, diarrhea, bleeding, constipation Vaginal bleeding Skin: negative for rash and pruritus Heme: negative for easy bruising and gum/nose bleeding MS: weakness Neurolo:negative for headaches, dizziness, vertigo, memory problems  Psych: f anxiety, irritation  Endocrine: negative  for thyroid , diabetes Allergy/Immunology- negative for any medication or food allergies ?  Objective:  VITALS:  BP (!) 90/47 (BP Location: Right Arm)   Pulse 88   Temp (!) 97.4 F (36.3 C)   Resp 18   Ht 5' 1 (1.549 m)   Wt 49 kg   SpO2 96%   BMI 20.41 kg/m   PHYSICAL EXAM:  General: Alert, not very cooperative, no distress, thin. Not sure wether there is some cognitive impairment Head: Normocephalic, without obvious abnormality, atraumatic. Eyes: Conjunctivae clear, anicteric sclerae. Pupils are equal ENT Nares normal. No drainage or sinus tenderness. Lips, mucosa, and tongue normal. No Thrush Poor dentition Neck: Supple,  symmetrical, no adenopathy, thyroid : non tender no carotid bruit and no JVD. Back: No CVA tenderness. Lungs: Clear to auscultation bilaterally. No Wheezing or Rhonchi. No rales. Heart: Regular rate and rhythm, no murmur, rub or gallop. Abdomen: Soft, non-tender,not distended. Bowel sounds normal. No masses Extremities: atraumatic, no cyanosis. No edema. No clubbing Skin: No rashes or lesions. Or bruising Lymph: Cervical, supraclavicular normal. Neurologic: Grossly non-focal Pertinent Labs Lab Results CBC          Component Value Date/Time   WBC 16.9 (H) 12/15/2023 0507   RBC 3.79 (L) 12/15/2023 0507   HGB 9.9 (L) 12/15/2023 0507   HGB 9.9 (L) 12/10/2023 1153   HCT 31.7 (L) 12/15/2023 0507   PLT 607 (H) 12/15/2023 0507   PLT 679 (H) 12/10/2023 1153   MCV 83.6 12/15/2023 0507   MCH 26.1 12/15/2023 0507   MCHC 31.2 12/15/2023 0507   RDW 17.8 (H) 12/15/2023 0507   LYMPHSABS 1.8 12/10/2023 1520   MONOABS 1.6 (H) 12/10/2023 1520   EOSABS 0.0 12/10/2023 1520   BASOSABS 0.1 12/10/2023 1520       Latest Ref Rng & Units 12/15/2023    5:07 AM 12/14/2023    5:02 AM 12/13/2023    4:09 AM  CMP  Glucose 70 - 99 mg/dL 90  893  97   BUN 8 - 23 mg/dL 7  7  8    Creatinine 0.44 - 1.00 mg/dL 9.61  9.61  9.57   Sodium 135 - 145 mmol/L 134  134  134   Potassium 3.5 - 5.1 mmol/L 3.6  4.1  4.3   Chloride 98 - 111 mmol/L 100  98  100   CO2 22 - 32 mmol/L 23  25  26    Calcium  8.9 - 10.3 mg/dL 8.3  8.7  8.5       Microbiology: Recent Results (from the past 240 hours)  Blood culture (routine x 2)     Status: None (Preliminary result)   Collection Time: 12/10/23  3:20 PM   Specimen: BLOOD  Result Value Ref Range Status   Specimen Description BLOOD RIGHT ANTECUBITAL  Final   Special Requests   Final    BOTTLES DRAWN AEROBIC AND ANAEROBIC Blood Culture adequate volume   Culture   Final    NO GROWTH 4 DAYS Performed at [facility], [address] [place], [place] [address]     Report Status PENDING  Incomplete  Resp panel by RT-PCR (RSV, Flu A&B, Covid) Anterior Nasal Swab     Status: None   Collection Time: 12/10/23  4:25 PM   Specimen: Anterior Nasal Swab  Result Value Ref Range Status   SARS Coronavirus 2 by RT PCR NEGATIVE NEGATIVE Final    Comment: (NOTE) SARS-CoV-2 target nucleic acids are NOT DETECTED.  The SARS-CoV-2 RNA is  generally detectable in upper respiratory specimens during the acute phase of infection. The lowest concentration of SARS-CoV-2 viral copies this assay can detect is 138 copies/mL. A negative result does not preclude SARS-Cov-2 infection and should not be used as the sole basis for treatment or other patient management decisions. A negative result may occur with  improper specimen collection/handling, submission of specimen other than nasopharyngeal swab, presence of viral mutation(s) within the areas targeted by this assay, and inadequate number of viral copies(<138 copies/mL). A negative result must be combined with clinical observations, patient history, and epidemiological information. The expected result is Negative.  Fact Sheet for Patients:  BloggerCourse.com  Fact Sheet for Healthcare Providers:  SeriousBroker.it  This test is no t yet approved or cleared by the [place]  FDA and  has been authorized for detection and/or diagnosis of SARS-CoV-2 by FDA under an Emergency Use Authorization (EUA). This EUA will remain  in effect (meaning this test can be used) for the duration of the COVID-19 declaration under Section 564(b)(1) of the Act, 21 U.S.C.section 360bbb-3(b)(1), unless the authorization is terminated  or revoked sooner.       Influenza A by PCR NEGATIVE NEGATIVE Final   Influenza B by PCR NEGATIVE NEGATIVE Final    Comment: (NOTE) The Xpert Xpress SARS-CoV-2/FLU/RSV plus assay is intended as an aid in the diagnosis of influenza from Nasopharyngeal  swab specimens and should not be used as a sole basis for treatment. Nasal washings and aspirates are unacceptable for Xpert Xpress SARS-CoV-2/FLU/RSV testing.  Fact Sheet for Patients: BloggerCourse.com  Fact Sheet for Healthcare Providers: SeriousBroker.it  This test is not yet approved or cleared by the [place]  FDA and has been authorized for detection and/or diagnosis of SARS-CoV-2 by FDA under an Emergency Use Authorization (EUA). This EUA will remain in effect (meaning this test can be used) for the duration of the COVID-19 declaration under Section 564(b)(1) of the Act, 21 U.S.C. section 360bbb-3(b)(1), unless the authorization is terminated or revoked.     Resp Syncytial Virus by PCR NEGATIVE NEGATIVE Final    Comment: (NOTE) Fact Sheet for Patients: BloggerCourse.com  Fact Sheet for Healthcare Providers: SeriousBroker.it  This test is not yet approved or cleared by the [place]  FDA and has been authorized for detection and/or diagnosis of SARS-CoV-2 by FDA under an Emergency Use Authorization (EUA). This EUA will remain in effect (meaning this test can be used) for the duration of the COVID-19 declaration under Section 564(b)(1) of the Act, 21 U.S.C. section 360bbb-3(b)(1), unless the authorization is terminated or revoked.  Performed at [facility], [address], [place], [place] [address]   Urine Culture     Status: None   Collection Time: 12/10/23  4:25 PM   Specimen: Urine, Clean Catch  Result Value Ref Range Status   Specimen Description   Final    URINE, CLEAN CATCH Performed at [facility], [address], [place], [place] [address]    Special Requests   Final    NONE Performed at [facility], [address] [place], [place] [address]    Culture   Final    NO GROWTH Performed at [facility],  [address] [place], [place] [address]    Report Status 12/11/2023 FINAL  Final  Blood culture (routine x 2)     Status: None (Preliminary result)   Collection Time: 12/10/23  8:41 PM   Specimen: BLOOD  Result Value Ref Range Status  Specimen Description BLOOD BLOOD LEFT HAND [facility]  Final   Special Requests   Final    BOTTLES DRAWN AEROBIC AND ANAEROBIC Blood Culture adequate volume   Culture   Final    NO GROWTH 4 DAYS Performed at [facility], [address] [place], [place] [address]    Report Status PENDING  Incomplete  MRSA Next Gen by PCR, Nasal     Status: None   Collection Time: 12/12/23 10:28 AM   Specimen: Nasal Mucosa; Nasal Swab  Result Value Ref Range Status   MRSA by PCR Next Gen NOT DETECTED NOT DETECTED Final    Comment: (NOTE) The GeneXpert MRSA Assay (FDA approved for NASAL specimens only), is one component of a comprehensive MRSA colonization surveillance program. It is not intended to diagnose MRSA infection nor to guide or monitor treatment for MRSA infections. Test performance is not FDA approved in patients less than 60 years old. Performed at [facility], [address] [place], [place] [address]     IMAGING RESULTS: Ct abd/pelvis without contrast reviewed I have personally reviewed the films ? Impression/Recommendation ? 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 I have personally spent  -60--minutes involved in face-to-face  and non-face-to-face activities for this patient on the day of the visit. Professional time spent includes the following activities: Preparing to see the patient (review of tests), Obtaining and/or reviewing separately obtained history (admission/discharge record), Performing a medically appropriate examination and/or evaluation , Ordering medications/tests/procedures, referring and communicating with other health care professionals, Documenting clinical information in the EMR, Independently interpreting results (not separately reported), Communicating results to the patient/family/caregiver, Counseling and educating the patient/family/caregiver and Care coordination (not separately reported).    ________________________________________________ Discussed with patient,  Will discuss with oncologist Note:  This document was prepared using Dragon voice recognition software and may include unintentional dictation errors.

## 2023-12-15 NOTE — Progress Notes (Addendum)
 Mobility Specialist Progress Note:    12/15/23 0800  Mobility  Activity Refused and notified nurse if applicable   Pt refused mobility, does not feeling like getting OOB yet. Will attempt at a later time, all needs met.  Sherrilee Ditty Mobility Specialist Please contact via Special educational needs teacher or  Rehab office at [phone]

## 2023-12-15 NOTE — Telephone Encounter (Signed)
 I did see her while she was in the hospital.  I am trying to get her port placement while she is admitted.  I also did ask the hospitalist to see if they can give her IV iron 

## 2023-12-15 NOTE — [place] FL2 (Signed)
 [place]  MEDICAID FL2 LEVEL OF CARE FORM     IDENTIFICATION  Patient Name: Valerie Leonard Birthdate: [birth year] Sex: female Admission Date (Current Location): 12/10/2023  [facility] and [place] Number:  Chiropodist and Address:  [facility], [address] [place], [place] [address]      Provider Number: [id number]  Attending Physician Name and Address:  Trudy Anthony HERO, MD  Relative Name and Phone Number:  [name] [phone]    Current Level of Care: Hospital Recommended Level of Care: Skilled Nursing Facility Prior Approval Number:    Date Approved/Denied:   PASRR Number: Pending additional information required  Discharge Plan: SNF    Current Diagnoses: Patient Active Problem List   Diagnosis Date Noted   Endometrial carcinoma (HCC) 12/14/2023   Sepsis (HCC) 12/10/2023   Endometrial cancer (HCC) 11/11/2023   Anemia 11/04/2023   ABLA (acute blood loss anemia) 09/28/2023   Pressure injury of skin 09/28/2023   Vaginal bleeding 09/27/2023   Severe major depression, single episode, without psychotic features (HCC) 07/26/2020   Depression    Acute delirium 07/25/2020   Acute metabolic encephalopathy    Hypomagnesemia    Chronic alcohol abuse    Hypokalemia 07/24/2020    Orientation RESPIRATION BLADDER Height & Weight     Self, Time, Situation, Place  Normal Continent Weight: 49 kg Height:  5' 1 (154.9 cm)  BEHAVIORAL SYMPTOMS/MOOD NEUROLOGICAL BOWEL NUTRITION STATUS      Continent Diet  AMBULATORY STATUS COMMUNICATION OF NEEDS Skin   Extensive Assist Verbally Normal                       Personal Care Assistance Level of Assistance  Bathing, Feeding, Dressing Bathing Assistance: Limited assistance Feeding assistance: Independent Dressing Assistance: Limited assistance     Functional Limitations Info  Sight, Hearing, Speech Sight Info: Impaired (wears glasses) Hearing Info: Adequate Speech Info: Adequate     SPECIAL CARE FACTORS FREQUENCY  PT (By licensed PT), OT (By licensed OT)     PT Frequency: 5x week OT Frequency: 5x week            Contractures Contractures Info: Not present    Additional Factors Info  Code Status, Allergies Code Status Info: Full Allergies Info: None           Current Medications (12/15/2023):  This is the current hospital active medication list Current Facility-Administered Medications  Medication Dose Route Frequency Provider Last Rate Last Admin   acetaminophen  (TYLENOL ) tablet 650 mg  650 mg Oral Q6H PRN Garba, Mohammad L, MD   650 mg at 12/14/23 1302   Or   acetaminophen  (TYLENOL ) suppository 650 mg  650 mg Rectal Q6H PRN Sim Emery CROME, MD       ALPRAZolam  (XANAX ) tablet 0.25 mg  0.25 mg Oral BID PRN Williams, Jamiese M, MD   0.25 mg at 12/11/23 1832   cefTRIAXone  (ROCEPHIN ) 2 g in sodium chloride  0.9 % 100 mL IVPB  2 g Intravenous Q24H Trudy Anthony HERO, MD 200 mL/hr at 12/14/23 2337 2 g at 12/14/23 2337   diphenhydrAMINE  (BENADRYL ) capsule 50 mg  50 mg Oral QHS Trudy Anthony HERO, MD   50 mg at 12/14/23 2206   docusate sodium  (COLACE) capsule 200 mg  200 mg Oral BID Trudy Anthony HERO, MD   200 mg at 12/14/23 2206   escitalopram  (LEXAPRO ) tablet 5 mg  5 mg Oral Daily Trudy Anthony HERO, MD  5 mg at 12/15/23 1146   feeding supplement (ENSURE PLUS HIGH PROTEIN) liquid 237 mL  237 mL Oral BID BM Garba, Mohammad L, MD   237 mL at 12/15/23 1438   lactulose  (CHRONULAC ) 10 GM/15ML solution 20 g  20 g Oral BID Trudy Anthony HERO, MD   20 g at 12/14/23 2206   metroNIDAZOLE  (FLAGYL ) IVPB 500 mg  500 mg Intravenous Q12H Sim Emery CROME, MD   Stopped at 12/15/23 1155   ondansetron  (ZOFRAN ) tablet 4 mg  4 mg Oral Q6H PRN Sim Emery CROME, MD       Or   ondansetron  (ZOFRAN ) injection 4 mg  4 mg Intravenous Q6H PRN Garba, Mohammad L, MD   4 mg at 12/15/23 0524   polyethylene glycol (MIRALAX  / GLYCOLAX ) packet 17 g  17 g Oral Daily PRN Trudy Anthony HERO, MD   17 g at 12/13/23 1030     Discharge Medications: Please see discharge summary for a list of discharge medications.  Relevant Imaging Results:  Relevant Lab Results:   Additional Information SS# [id number]  Racheal CROME Schimke, RN

## 2023-12-15 NOTE — Progress Notes (Signed)
  IR BRIEF PROGRESS NOTE:  Patient was tentatively scheduled for Port-A-Cath placement with IR today. Unfortunately, patient's leukocytosis as begun to rise again over the last 48 hours, with WBC @ 16.9 this am. Dr. Karalee, IR attending, has opted to defer Port-A-Cath placement today. Care Team advised, and patient may eat. Dr. Karalee did offer PICC placement in the interim, and while leukocytosis remains elevated and without know source, with port placement attempted once patient is discharged, as outpatient. Dr. Melanee has declined PICC placement. ID is following patient, and Dr. Melanee noted that patient's vitals are acceptable, and procalcitonin is down trending as well. IR will tentatively proceed with Port-A-Cath placement tomorrow, pending ID approval.  Electronically Signed: Carlin DELENA Griffon, PA-C 12/15/2023, 5:47 PM

## 2023-12-15 NOTE — Progress Notes (Signed)
 PROGRESS NOTE   HPI was taken from Dr. Sim: Valerie Leonard is a 68 y.o. female with medical history significant of endometrial cancer not yet on chemotherapy, depressive disorder, history of alcoholism, who was at the cancer center today to get her access for initiating chemotherapy.  She was noted to have fever and concern for sepsis was raised.  She was subsequently sent over to to the ER for evaluation.  She had recent UTI for which she was treated with cefdinir .  The urine culture was nondiagnostic with multiple species.  Today however patient noted to have white count of 26,000.Lactic acid 2.2.  She had a temperature 102.2.  Also hypotensive on arrival with systolic of 70 and diastolic 48.  Patient appears to have severe sepsis.  The source is not clear but suspicion for possible bacteremia.  Patient is therefore being admitted with sepsis of unknown source.  She is confused.  Appears to have acute metabolic encephalopathy from the incident.     Valerie Leonard  FMW:[id number] DOB: 11-Aug-1955 DOA: 12/10/2023 PCP: Pcp, No    Assessment & Plan:   Principal Problem:   Sepsis (HCC) Active Problems:   Chronic alcohol abuse   Severe major depression, single episode, without psychotic features (HCC)   Anemia   Endometrial cancer (HCC)   Endometrial carcinoma (HCC)  Assessment and Plan: Severe sepsis: see how pt met severe sepsis criteria as per Dr. Sherrilee note.  No obvious source of her sepsis. Blood cxs NGTD. Urine cx shows no growth. CXR was neg. RSV, influenza, & COVID19 are all neg. Continue on IV rocephin , flagyl . ID consulted. WBC is trending up again. CT/abd pelvis had limited evaluation of uterine mass.   Endometrial cancer: stage III. Oncologist is Dr. Melanee. Still w/ vaginal bleeding. Chemo port not placed today b/c increasing WBC. ID consulted. Onco following and recs apprec   ACD: secondary to endometrial bleed. S/p 1 unit of pRBCs transfused so far. S/p IV iron  x 1 as per onco.  H&H are labile   Hx of alcohol abuse: remote hx. Will continue to monitor     Acute metabolic encephalopathy: likely secondary to possible infection. Mental status has improved today   Hypotension: episode of hypotension while walking w/ therapy on 12/13/23. S/p NS bolus x1. Keep MAP >65    Thrombocytosis: etiology unclear, possibly reactive. Will continue to monitor       DVT prophylaxis: SCDs Code Status: full  Family Communication:  Disposition Plan: HH vs SNF  Level of care: Telemetry Medical  Status is: Inpatient Remains inpatient appropriate because: severity of illness, requiring IV abxs    Consultants:  ID Onco   Procedures:   Antimicrobials: rocephin , flagyl    Subjective: Pt c/o being hungry   Objective: Vitals:   12/14/23 1746 12/14/23 1952 12/15/23 0551 12/15/23 0858  BP: (!) 86/58 125/69 96/63 (!) 90/47  Pulse: 83 80 93 88  Resp: 16 16 16 18   Temp: (!) 97.5 F (36.4 C) 97.6 F (36.4 C) 98.1 F (36.7 C) (!) 97.4 F (36.3 C)  TempSrc:  Oral    SpO2: 99% 100% 99% 96%  Weight:      Height:        Intake/Output Summary (Last 24 hours) at 12/15/2023 0935 Last data filed at 12/14/2023 1900 Gross per 24 hour  Intake 240 ml  Output 700 ml  Net -460 ml   Filed Weights   12/10/23 1517  Weight: 49 kg    Examination:  General  exam: appears frustrated    Respiratory system: clear breath sounds b/l Cardiovascular system: S1 & S2+. No rubs or clicks   Gastrointestinal system: abd is soft, NT, hypoactive bowel sounds  Central nervous system: alert & awake. Moves all extremities  Psychiatry: Judgement and insight appears improved. Flat mood and affect    Data Reviewed: I have personally reviewed following labs and imaging studies  CBC: Recent Labs  Lab 12/10/23 1153 12/10/23 1520 12/11/23 0445 12/11/23 1644 12/12/23 0737 12/13/23 0409 12/14/23 0502 12/15/23 0507  WBC 25.9* 27.1* 19.7*  --  16.0* 12.6* 13.1* 16.9*  NEUTROABS 23.0* 23.3*   --   --   --   --   --   --   HGB 9.9* 8.7* 6.9* 9.2* 9.9* 10.0* 10.1* 9.9*  HCT 32.0* 28.2* 22.3* 28.8* 31.5* 31.6* 31.9* 31.7*  MCV 82.9 83.7 84.5  --  82.9 83.6 82.6 83.6  PLT 679* 611* 467*  --  506* 501* 575* 607*   Basic Metabolic Panel: Recent Labs  Lab 12/11/23 0445 12/12/23 0737 12/13/23 0409 12/14/23 0502 12/15/23 0507  NA 137 135 134* 134* 134*  K 3.5 3.1* 4.3 4.1 3.6  CL 110 104 100 98 100  CO2 24 26 26 25 23   GLUCOSE 89 104* 97 106* 90  BUN 9 <5* 8 7* 7*  CREATININE 0.31* 0.47 0.42* 0.38* 0.38*  CALCIUM  7.4* 8.0* 8.5* 8.7* 8.3*   GFR: Estimated Creatinine Clearance: 51.5 mL/min (A) (by C-G formula based on SCr of 0.38 mg/dL (L)). Liver Function Tests: Recent Labs  Lab 12/10/23 1153 12/10/23 1520 12/11/23 0445  AST 26 25 16   ALT 13 11 9   ALKPHOS 71 63 45  BILITOT 0.6 0.5 0.5  PROT 8.0 7.3 5.4*  ALBUMIN 2.7* 2.3* 1.7*   No results for input(s): LIPASE, AMYLASE in the last 168 hours. No results for input(s): AMMONIA in the last 168 hours. Coagulation Profile: Recent Labs  Lab 12/11/23 0445  INR 1.4*   Cardiac Enzymes: No results for input(s): CKTOTAL, CKMB, CKMBINDEX, TROPONINI in the last 168 hours. BNP (last 3 results) No results for input(s): PROBNP in the last 8760 hours. HbA1C: No results for input(s): HGBA1C in the last 72 hours. CBG: No results for input(s): GLUCAP in the last 168 hours. Lipid Profile: No results for input(s): CHOL, HDL, LDLCALC, TRIG, CHOLHDL, LDLDIRECT in the last 72 hours. Thyroid  Function Tests: No results for input(s): TSH, T4TOTAL, FREET4, T3FREE, THYROIDAB in the last 72 hours. Anemia Panel: No results for input(s): VITAMINB12, FOLATE, FERRITIN, TIBC, IRON , RETICCTPCT in the last 72 hours. Sepsis Labs: Recent Labs  Lab 12/10/23 1520 12/10/23 2040 12/15/23 0507  PROCALCITON 5.76  --  1.85  LATICACIDVEN 2.2* 1.3  --     Recent Results (from the past 240  hours)  Blood culture (routine x 2)     Status: None (Preliminary result)   Collection Time: 12/10/23  3:20 PM   Specimen: BLOOD  Result Value Ref Range Status   Specimen Description BLOOD RIGHT ANTECUBITAL  Final   Special Requests   Final    BOTTLES DRAWN AEROBIC AND ANAEROBIC Blood Culture adequate volume   Culture   Final    NO GROWTH 4 DAYS Performed at [facility] [address] [place], [place] [address]    Report Status PENDING  Incomplete  Resp panel by RT-PCR (RSV, Flu A&B, Covid) Anterior Nasal Swab     Status: None   Collection Time: 12/10/23  4:25 PM  Specimen: Anterior Nasal Swab  Result Value Ref Range Status   SARS Coronavirus 2 by RT PCR NEGATIVE NEGATIVE Final    Comment: (NOTE) SARS-CoV-2 target nucleic acids are NOT DETECTED.  The SARS-CoV-2 RNA is generally detectable in upper respiratory specimens during the acute phase of infection. The lowest concentration of SARS-CoV-2 viral copies this assay can detect is 138 copies/mL. A negative result does not preclude SARS-Cov-2 infection and should not be used as the sole basis for treatment or other patient management decisions. A negative result may occur with  improper specimen collection/handling, submission of specimen other than nasopharyngeal swab, presence of viral mutation(s) within the areas targeted by this assay, and inadequate number of viral copies(<138 copies/mL). A negative result must be combined with clinical observations, patient history, and epidemiological information. The expected result is Negative.  Fact Sheet for Patients:  BloggerCourse.com  Fact Sheet for Healthcare Providers:  SeriousBroker.it  This test is no t yet approved or cleared by the [place]  FDA and  has been authorized for detection and/or diagnosis of SARS-CoV-2 by FDA under an Emergency Use Authorization (EUA). This EUA will remain  in effect (meaning  this test can be used) for the duration of the COVID-19 declaration under Section 564(b)(1) of the Act, 21 U.S.C.section 360bbb-3(b)(1), unless the authorization is terminated  or revoked sooner.       Influenza A by PCR NEGATIVE NEGATIVE Final   Influenza B by PCR NEGATIVE NEGATIVE Final    Comment: (NOTE) The Xpert Xpress SARS-CoV-2/FLU/RSV plus assay is intended as an aid in the diagnosis of influenza from Nasopharyngeal swab specimens and should not be used as a sole basis for treatment. Nasal washings and aspirates are unacceptable for Xpert Xpress SARS-CoV-2/FLU/RSV testing.  Fact Sheet for Patients: BloggerCourse.com  Fact Sheet for Healthcare Providers: SeriousBroker.it  This test is not yet approved or cleared by the [place]  FDA and has been authorized for detection and/or diagnosis of SARS-CoV-2 by FDA under an Emergency Use Authorization (EUA). This EUA will remain in effect (meaning this test can be used) for the duration of the COVID-19 declaration under Section 564(b)(1) of the Act, 21 U.S.C. section 360bbb-3(b)(1), unless the authorization is terminated or revoked.     Resp Syncytial Virus by PCR NEGATIVE NEGATIVE Final    Comment: (NOTE) Fact Sheet for Patients: BloggerCourse.com  Fact Sheet for Healthcare Providers: SeriousBroker.it  This test is not yet approved or cleared by the [place]  FDA and has been authorized for detection and/or diagnosis of SARS-CoV-2 by FDA under an Emergency Use Authorization (EUA). This EUA will remain in effect (meaning this test can be used) for the duration of the COVID-19 declaration under Section 564(b)(1) of the Act, 21 U.S.C. section 360bbb-3(b)(1), unless the authorization is terminated or revoked.  Performed at [facility], [address], [place], [place] [address]   Urine Culture     Status:  None   Collection Time: 12/10/23  4:25 PM   Specimen: Urine, Clean Catch  Result Value Ref Range Status   Specimen Description   Final    URINE, CLEAN CATCH Performed at [facility], [address] [place], [place] [address]    Special Requests   Final    NONE Performed at [facility], [address], [place], [place] [address]    Culture   Final    NO GROWTH Performed at [facility], [address], [place], [place] [address]    Report Status  12/11/2023 FINAL  Final  Blood culture (routine x 2)     Status: None (Preliminary result)   Collection Time: 12/10/23  8:41 PM   Specimen: BLOOD  Result Value Ref Range Status   Specimen Description BLOOD BLOOD LEFT HAND [facility]  Final   Special Requests   Final    BOTTLES DRAWN AEROBIC AND ANAEROBIC Blood Culture adequate volume   Culture   Final    NO GROWTH 4 DAYS Performed at [facility], [address], [place], [place] [address]    Report Status PENDING  Incomplete  MRSA Next Gen by PCR, Nasal     Status: None   Collection Time: 12/12/23 10:28 AM   Specimen: Nasal Mucosa; Nasal Swab  Result Value Ref Range Status   MRSA by PCR Next Gen NOT DETECTED NOT DETECTED Final    Comment: (NOTE) The GeneXpert MRSA Assay (FDA approved for NASAL specimens only), is one component of a comprehensive MRSA colonization surveillance program. It is not intended to diagnose MRSA infection nor to guide or monitor treatment for MRSA infections. Test performance is not FDA approved in patients less than 45 years old. Performed at [facility], [address] [place], [place] [address]          Radiology Studies: CT ABDOMEN PELVIS WO CONTRAST Result Date: 12/14/2023 EXAM: CT ABDOMEN AND PELVIS WITHOUT CONTRAST 12/14/2023 08:34:03 PM TECHNIQUE: CT of the abdomen and pelvis was performed without the administration of intravenous contrast. Multiplanar reformatted images are provided for review.  Automated exposure control, iterative reconstruction, and/or weight-based adjustment of the mA/kV was utilized to reduce the radiation dose to as low as reasonably achievable. COMPARISON: 11/06/2023 CLINICAL HISTORY: Ureteral cancer, monitor; ? uterine infection. Has sepsis and unknown source. FINDINGS: LOWER CHEST: No acute abnormality. Unchanged 6 mm subpleural nodule in parenchyma (series 4, image 3). LIVER: The liver is unremarkable. GALLBLADDER AND BILE DUCTS: Gallbladder is unremarkable. No biliary ductal dilatation. SPLEEN: No acute abnormality. PANCREAS: No acute abnormality. ADRENAL GLANDS: No acute abnormality. KIDNEYS, URETERS AND BLADDER: No stones in the kidneys or ureters. No hydronephrosis. No perinephric or periureteral stranding. Urinary bladder is unremarkable. GI AND BOWEL: Stomach demonstrates no acute abnormality. There is no bowel obstruction. PERITONEUM AND RETROPERITONEUM: No ascites. No free air. VASCULATURE: Aortic atherosclerotic calcification. LYMPH NODES: Interval increase in size of multiple retroperitoneal lymph nodes. For example, a left periaortic lymph node measuring 10 mm, following series 2, image 35, previously measured 7 mm. A 20 mm right external iliac node on series 2, image 63 previously measured 18 mm. A 16 mm left external iliac node on series 263, previously measured 14 mm. REPRODUCTIVE ORGANS: Evaluation of the known mass in the uterus is limited without IV contrast. There is mass-like expansion centered in the lower uterine segment with heterogeneous fluid in the uterine fundus. Findings are similar to CT dated 11/06/23, given the difference is in technique. 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 No acute osseous abnormality. No focal soft tissue abnormality. IMPRESSION: 1. Limited evaluation of the known uterine mass without IV contrast. Findings are similar to prior CT with contrast dated 11/06/23, given the difference in technique. 2. Interval increase in  size of multiple retroperitoneal lymph nodes. Electronically signed by: Norman Gatlin MD 12/14/2023 09:05 PM EDT RP Workstation: [id number]         Scheduled Meds:  diphenhydrAMINE   50 mg Oral QHS   docusate sodium   200 mg Oral BID   escitalopram   5  mg Oral Daily   feeding supplement  237 mL Oral BID BM   lactulose   20 g Oral BID   Continuous Infusions:  cefTRIAXone  (ROCEPHIN )  IV 2 g (12/14/23 2337)   metronidazole  500 mg (12/14/23 2225)     LOS: 5 days      Anthony CHRISTELLA Pouch, MD [facility] Pager [phone]  If 7PM-7AM, please contact night-coverage [email or web address] 12/15/2023, 9:35 AM

## 2023-12-15 NOTE — Plan of Care (Signed)
  Problem: Pain Managment: Goal: General experience of comfort will improve and/or be controlled 12/15/2023 1259 by Tanda Dixon E, LPN Outcome: Progressing 12/15/2023 1258 by Tanda Dixon BRAVO, LPN Outcome: Progressing   Problem: Safety: Goal: Ability to remain free from injury will improve 12/15/2023 1259 by Tanda Dixon BRAVO, LPN Outcome: Progressing 12/15/2023 1258 by Tanda Dixon BRAVO, LPN Outcome: Progressing

## 2023-12-15 NOTE — Plan of Care (Signed)
 The patient went for a CT scan at 2000. She has had several large loose stools tonight with scheduled Lactulose  given at bedtime. The patient has been NPO after mid night for porta cath placement later today. PRN IV zofran  4mg  was given for c/o nausea.

## 2023-12-15 NOTE — Progress Notes (Signed)
 Physical Therapy Treatment Patient Details Name: Valerie Leonard MRN: [id number] DOB: [birth year] Today's Date: 12/15/2023   History of Present Illness Patient is a 68 year old female with sepsis. PMH: endometrial cancer, alcohol abuse, acute metabolic encephalopathy    PT Comments  Patient is agreeable to PT session with encouragement. She still requires assistance with mobility. Short distance ambulation with rolling walker and cues for improved gait kinematics. She is fatigued with activity. Recommend to continue PT to maximize independence and decrease caregiver burden. Anticipate patient would need caregiver assistance for safe transition home at this time. Consider rehabilitation < 3 hours/day after this hospital stay.    If plan is discharge home, recommend the following: A little help with walking and/or transfers;A lot of help with bathing/dressing/bathroom;Assistance with cooking/housework;Assist for transportation;Help with stairs or ramp for entrance   Can travel by private vehicle     No  Equipment Recommendations  None recommended by PT    Recommendations for Other Services       Precautions / Restrictions Precautions Precautions: Fall Recall of Precautions/Restrictions: Impaired Restrictions Weight Bearing Restrictions Per Provider Order: No     Mobility  Bed Mobility Overal bed mobility: Needs Assistance Bed Mobility: Supine to Sit     Supine to sit: Min assist Sit to supine: Min assist   General bed mobility comments: cues for technique    Transfers Overall transfer level: Needs assistance Equipment used: Rolling walker (2 wheels) Transfers: Sit to/from Stand Sit to Stand: Min assist           General transfer comment: lifting assistance required for standing    Ambulation/Gait Ambulation/Gait assistance: Contact guard assist Gait Distance (Feet): 70 Feet Assistive device: Rolling walker (2 wheels) Gait Pattern/deviations: Step-through  pattern Gait velocity: decreased     General Gait Details: no heel strike on the right without cues for technique. patient continues place LUE on front bar of walker rather than walker hand rest despite encouragement for proper use. patient reports fatigue with activity. several standing rest breaks required for short distance ambulation   Stairs             Wheelchair Mobility     Tilt Bed    Modified Rankin (Stroke Patients Only)       Balance Overall balance assessment: Needs assistance Sitting-balance support: Feet supported Sitting balance-Leahy Scale: Good     Standing balance support: Bilateral upper extremity supported Standing balance-Leahy Scale: Fair                              Hotel manager: No apparent difficulties  Cognition Arousal: Alert Behavior During Therapy: WFL for tasks assessed/performed   PT - Cognitive impairments: Safety/Judgement, Awareness                       PT - Cognition Comments: patient overestimates abilities, decreased awareness of physical limitations, need for rest breaks, etc. Following commands: Impaired Following commands impaired: Follows multi-step commands with increased time    Cueing Cueing Techniques: Verbal cues  Exercises      General Comments        Pertinent Vitals/Pain Pain Assessment Pain Assessment: No/denies pain    Home Living                          Prior Function  PT Goals (current goals can now be found in the care plan section) Acute Rehab PT Goals Patient Stated Goal: to go home PT Goal Formulation: With patient Time For Goal Achievement: 12/27/23 Potential to Achieve Goals: Good Progress towards PT goals: Progressing toward goals    Frequency    Min 2X/week      PT Plan      Co-evaluation              AM-PAC PT 6 Clicks Mobility   Outcome Measure  Help needed turning from your back to  your side while in a flat bed without using bedrails?: A Little Help needed moving from lying on your back to sitting on the side of a flat bed without using bedrails?: A Little Help needed moving to and from a bed to a chair (including a wheelchair)?: A Little Help needed standing up from a chair using your arms (e.g., wheelchair or bedside chair)?: A Little Help needed to walk in hospital room?: A Little Help needed climbing 3-5 steps with a railing? : Total 6 Click Score: 16    End of Session Equipment Utilized During Treatment: Gait belt Activity Tolerance: Patient tolerated treatment well Patient left: with call bell/phone within reach;with chair alarm set;in bed Nurse Communication: Mobility status PT Visit Diagnosis: Muscle weakness (generalized) (M62.81);Unsteadiness on feet (R26.81)     Time: 8669-8591 PT Time Calculation (min) (ACUTE ONLY): 38 min  Charges:    $Gait Training: 8-22 mins $Therapeutic Activity: 23-37 mins PT General Charges $$ ACUTE PT VISIT: 1 Visit                     Valerie Leonard, PT, MPT    Valerie Leonard 12/15/2023, 2:58 PM

## 2023-12-15 NOTE — Progress Notes (Signed)
 [facility] CSW Progress Note  Clinical Social Work introduced self to patient's sister during Patient Education with Raoul Moats, Charity fundraiser.  Provided information regarding CSW role, including counseling, advanced care planning and support group.  Answered questions as needed.   Follow Up Plan:  CSW will follow-up with patient by phone     Macario CHRISTELLA Au, LCSW Clinical Social Worker [facility]

## 2023-12-15 NOTE — TOC Initial Note (Signed)
 Transition of Care [facility]) - Initial/Assessment Note    Patient Details  Name: Valerie Leonard MRN: [id number] Date of Birth: Sep 03, 1955  [facility]) CM/SW Contact:    Racheal LITTIE Schimke, RN Phone Number: 12/15/2023, 3:15 PM  Clinical Narrative: Spoke with patient and Sister, Valerie at bedside. Patient voices living alone with assistance of neighbors, IADL's, meal prep and neighbor provide transport. Sister says patient is unable to clean and take care of herself, feces on floor in bathroom with soiled depends. Sister feels patient is unable to live alone. Informed that SNF PT/OT will assist with that following evaluation. Patient and Sister did agree to SNF, no preference, want to remain in [place], understands referral will be sent to SNF's in [place] And bed offers will be shared with them to decide.             Expected Discharge Plan: Skilled Nursing Facility Barriers to Discharge: Continued Medical Work up   Patient Goals and CMS Choice Patient states their goals for this hospitalization and ongoing recovery are:: SNF, discussed no preference at this time. CMS Medicare.gov Compare Post Acute Care list provided to:: Other (Comment Required) (Want to review bed offers when received.) Choice offered to / list presented to : Patient, Sibling      Expected Discharge Plan and Services In-house Referral: Clinical Social Work Discharge Planning Services: CM Consult Post Acute Care Choice: Skilled Nursing Facility Living arrangements for the past 2 months: Single Family Home                 DME Arranged: N/A DME Agency: NA       HH Arranged: NA HH Agency: NA        Prior Living Arrangements/Services Living arrangements for the past 2 months: Single Family Home Lives with:: Self Patient language and need for interpreter reviewed:: Yes Do you feel safe going back to the place where you live?: Yes      Need for Family Participation in Patient Care: Yes  (Comment) Care giver support system in place?: Yes (comment) Current home services: Other (comment) (Neighbor, Sister Valerie Leonard) Criminal Activity/Legal Involvement Pertinent to Current Situation/Hospitalization: No - Comment as needed  Activities of Daily Living   ADL Screening (condition at time of admission) Independently performs ADLs?: Yes (appropriate for developmental age) Is the patient deaf or have difficulty hearing?: No Does the patient have difficulty seeing, even when wearing glasses/contacts?: No Does the patient have difficulty concentrating, remembering, or making decisions?: Yes  Permission Sought/Granted Permission sought to share information with : Case Manager, Other (comment), Family Supports Giles Leonard, sister and POA) Permission granted to share information with : Yes, Verbal Permission Granted  Share Information with NAME: Valerie Leonard- sister           Emotional Assessment Appearance:: Appears stated age Attitude/Demeanor/Rapport: Engaged, Self-Confident Affect (typically observed): Accepting Orientation: : Oriented to Self, Oriented to Place, Oriented to  Time, Oriented to Situation Alcohol / Substance Use: Alcohol Use Psych Involvement: No (comment)  Admission diagnosis:  Sepsis (HCC) [A41.9] Severe sepsis (HCC) [A41.9, R65.20] Patient Active Problem List   Diagnosis Date Noted   Endometrial carcinoma (HCC) 12/14/2023   Sepsis (HCC) 12/10/2023   Endometrial cancer (HCC) 11/11/2023   Anemia 11/04/2023   ABLA (acute blood loss anemia) 09/28/2023   Pressure injury of skin 09/28/2023   Vaginal bleeding 09/27/2023   Severe major depression, single episode, 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 [place] [address] Phone: [phone] Fax: [phone]  [facility] [place], [place] - [address] [place] [address] Phone: [phone] Fax: [phone]  [id number], [place] - [address] AT [facility] OF [address] [facility] [place] [address] Phone: [phone] Fax: [phone]     Social Drivers of Health (SDOH) Social History: SDOH Screenings   Food Insecurity: No Food Insecurity (12/10/2023)  Recent Concern: Food Insecurity - Food Insecurity Present (09/27/2023)  Housing: Low Risk  (12/10/2023)  Transportation Needs: No Transportation Needs (12/10/2023)  Recent Concern: Transportation Needs - Unmet Transportation Needs (09/27/2023)  Utilities: Not At Risk (12/10/2023)  Depression (PHQ2-9): Low Risk  (12/10/2023)  Financial Resource Strain: Low Risk  (10/13/2023)   [facility]  Social Connections: Unknown (12/11/2023)  Recent Concern: Social Connections - Socially Isolated (09/28/2023)  Tobacco Use: Medium Risk (12/11/2023)   SDOH Interventions:     Readmission Risk Interventions     No data to display

## 2023-12-16 ENCOUNTER — Other Ambulatory Visit: Payer: Self-pay | Admitting: Oncology

## 2023-12-16 ENCOUNTER — Inpatient Hospital Stay

## 2023-12-16 ENCOUNTER — Inpatient Hospital Stay: Admitting: Radiology

## 2023-12-16 ENCOUNTER — Other Ambulatory Visit: Payer: Self-pay

## 2023-12-16 DIAGNOSIS — C541 Malignant neoplasm of endometrium: Secondary | ICD-10-CM | POA: Diagnosis not present

## 2023-12-16 DIAGNOSIS — R6510 Systemic inflammatory response syndrome (SIRS) of non-infectious origin without acute organ dysfunction: Secondary | ICD-10-CM | POA: Diagnosis not present

## 2023-12-16 DIAGNOSIS — E43 Unspecified severe protein-calorie malnutrition: Secondary | ICD-10-CM | POA: Insufficient documentation

## 2023-12-16 DIAGNOSIS — R6520 Severe sepsis without septic shock: Secondary | ICD-10-CM | POA: Diagnosis not present

## 2023-12-16 DIAGNOSIS — N939 Abnormal uterine and vaginal bleeding, unspecified: Secondary | ICD-10-CM

## 2023-12-16 DIAGNOSIS — A419 Sepsis, unspecified organism: Secondary | ICD-10-CM | POA: Diagnosis not present

## 2023-12-16 DIAGNOSIS — N950 Postmenopausal bleeding: Secondary | ICD-10-CM | POA: Insufficient documentation

## 2023-12-16 LAB — GASTROINTESTINAL PANEL BY PCR, STOOL (REPLACES STOOL CULTURE)

## 2023-12-16 LAB — BASIC METABOLIC PANEL WITH GFR
Anion gap: 7 (ref 5–15)
BUN: 11 mg/dL (ref 8–23)
CO2: 26 mmol/L (ref 22–32)
Calcium: 8.2 mg/dL — ABNORMAL LOW (ref 8.9–10.3)
Chloride: 104 mmol/L (ref 98–111)
Creatinine, Ser: 0.45 mg/dL (ref 0.44–1.00)
GFR, Estimated: >60 mL/min (ref >60)
Glucose, Bld: 88 mg/dL (ref 70–99)
Potassium: 3.6 mmol/L (ref 3.5–5.1)
Sodium: 137 mmol/L (ref 135–145)

## 2023-12-16 LAB — CBC
HCT: 30 % — ABNORMAL LOW (ref 36.0–46.0)
Hemoglobin: 9.5 g/dL — ABNORMAL LOW (ref 12.0–15.0)
MCH: 26.3 pg (ref 26.0–34.0)
MCHC: 31.7 g/dL (ref 30.0–36.0)
MCV: 83.1 fL (ref 80.0–100.0)
Platelets: 562 K/uL — ABNORMAL HIGH (ref 150–400)
RBC: 3.61 MIL/uL — ABNORMAL LOW (ref 3.87–5.11)
RDW: 17.7 % — ABNORMAL HIGH (ref 11.5–15.5)
WBC: 15 K/uL — ABNORMAL HIGH (ref 4.0–10.5)
nRBC: 0 % (ref 0.0–0.2)

## 2023-12-16 MED ORDER — THIAMINE MONONITRATE 100 MG PO TABS
100.0000 mg | ORAL_TABLET | Freq: Every day | ORAL | Status: DC
  Administered 2023-12-16 – 2023-12-18 (×3): 100 mg
  Filled 2023-12-16 (×3): qty 1

## 2023-12-16 MED ORDER — ADULT MULTIVITAMIN W/MINERALS CH
1.0000 | ORAL_TABLET | Freq: Every day | ORAL | Status: DC
  Administered 2023-12-16 – 2023-12-25 (×10): 1 via ORAL
  Filled 2023-12-16 (×10): qty 1

## 2023-12-16 MED ORDER — CHLORHEXIDINE GLUCONATE CLOTH 2 % EX PADS
6.0000 | MEDICATED_PAD | Freq: Every day | CUTANEOUS | Status: DC
  Administered 2023-12-16 – 2023-12-25 (×10): 6 via TOPICAL

## 2023-12-16 MED ORDER — SODIUM CHLORIDE 0.9 % IV BOLUS
1000.0000 mL | Freq: Once | INTRAVENOUS | Status: AC
  Administered 2023-12-16: 1000 mL via INTRAVENOUS

## 2023-12-16 MED ORDER — ENSURE PLUS HIGH PROTEIN PO LIQD
237.0000 mL | Freq: Three times a day (TID) | ORAL | Status: DC
  Administered 2023-12-16 – 2023-12-25 (×20): 237 mL via ORAL

## 2023-12-16 MED ORDER — NORETHINDRONE ACETATE 5 MG PO TABS
5.0000 mg | ORAL_TABLET | Freq: Three times a day (TID) | ORAL | Status: DC
Start: 2023-12-16 — End: 2023-12-21
  Administered 2023-12-16 – 2023-12-21 (×16): 5 mg via ORAL
  Filled 2023-12-16 (×18): qty 1

## 2023-12-16 MED ORDER — FOLIC ACID 1 MG PO TABS
1.0000 mg | ORAL_TABLET | Freq: Every day | ORAL | Status: DC
  Administered 2023-12-16 – 2023-12-25 (×10): 1 mg via ORAL
  Filled 2023-12-16 (×10): qty 1

## 2023-12-16 NOTE — Progress Notes (Signed)
 The patient's IV to her LFA was painful, red and warm to the touch. This writer removed the IV with minimal bleeding noted. IV consult for new IV site placement.

## 2023-12-16 NOTE — Plan of Care (Signed)
  Problem: Pain Managment: Goal: General experience of comfort will improve and/or be controlled Outcome: Progressing   Problem: Safety: Goal: Ability to remain free from injury will improve Outcome: Progressing

## 2023-12-16 NOTE — Progress Notes (Signed)
 Hematology/Oncology Consult note [facility]  Telephone:([phone] [phone]  Patient Care Team: Pcp, No as PCP - General Maurie Rayfield BIRCH, RN as Oncology Nurse Navigator Melanee Annah BROCKS, MD as Consulting Physician (Oncology)   Name of the patient: Valerie Leonard  [id number]  May 29, 1955   Date of visit: 12/16/2023   Interval history-remains afebrile but leukocytosis persists.  She has ongoing intermittent vaginal bleeding  ECOG PS- 2 Pain scale- 4   Review of systems- Review of Systems  Constitutional:  Negative for chills, fever, malaise/fatigue and weight loss.  HENT:  Negative for congestion, ear discharge and nosebleeds.   Eyes:  Negative for blurred vision.  Respiratory:  Negative for cough, hemoptysis, sputum production, shortness of breath and wheezing.   Cardiovascular:  Negative for chest pain, palpitations, orthopnea and claudication.  Gastrointestinal:  Negative for abdominal pain, blood in stool, constipation, diarrhea, heartburn, melena, nausea and vomiting.  Genitourinary:  Negative for dysuria, flank pain, frequency, hematuria and urgency.       Vaginal bleeding  Musculoskeletal:  Negative for back pain, joint pain and myalgias.  Skin:  Negative for rash.  Neurological:  Negative for dizziness, tingling, focal weakness, seizures, weakness and headaches.  Endo/Heme/Allergies:  Does not bruise/bleed easily.  Psychiatric/Behavioral:  Negative for depression and suicidal ideas. The patient does not have insomnia.       No Known Allergies   Past Medical History:  Diagnosis Date   Acute delirium    Acute metabolic encephalopathy    Anemia    blood loss anemia   Chronic alcohol abuse    Depression    Endometrial cancer (HCC)    Hypokalemia    Hypomagnesemia    Pressure injury of skin    Severe major depression, single episode, without psychotic features (HCC)    Vaginal bleeding      Past Surgical History:   Procedure Laterality Date   IR PATIENT EVAL TECH 0-60 MINS  12/10/2023   NO PAST SURGERIES      Social History   Socioeconomic History   Marital status: Single    Spouse name: Not on file   Number of children: 0   Years of education: Not on file   Highest education level: Not on file  Occupational History   Not on file  Tobacco Use   Smoking status: Former    Types: Cigarettes, Cigars   Smokeless tobacco: Never  Vaping Use   Vaping status: Never Used  Substance and Sexual Activity   Alcohol use: Yes    Alcohol/week: 3.0 standard drinks of alcohol    Types: 3 Cans of beer per week    Comment: More than 4 glasses of wine, mixed drinks, beer daily.    Drug use: Not Currently    Comment: former use of marijuana   Sexual activity: Not Currently    Birth control/protection: None  Other Topics Concern   Not on file  Social History Narrative   Lives by herself    Social Drivers of Health   Financial Resource Strain: Low Risk  (10/13/2023)   Received from [facility]   Overall Financial Resource Strain (CARDIA)    Difficulty of Paying Living Expenses: Not very hard  Food Insecurity: No Food Insecurity (12/10/2023)   Hunger Vital Sign    Worried About Running Out of Food in the Last Year: Never true    Ran Out of Food in the Last Year: Never true  Recent Concern:  Food Insecurity - Food Insecurity Present (09/27/2023)   Hunger Vital Sign    Worried About Running Out of Food in the Last Year: Sometimes true    Ran Out of Food in the Last Year: Sometimes true  Transportation Needs: No Transportation Needs (12/10/2023)   PRAPARE - Administrator, Civil Service (Medical): No    Lack of Transportation (Non-Medical): No  Recent Concern: Transportation Needs - Unmet Transportation Needs (09/27/2023)   PRAPARE - Administrator, Civil Service (Medical): Yes    Lack of Transportation (Non-Medical): Yes  Physical Activity: Not on file  Stress: Not  on file  Social Connections: Unknown (12/11/2023)   Social Connection and Isolation Panel    Frequency of Communication with Friends and Family: More than three times a week    Frequency of Social Gatherings with Friends and Family: More than three times a week    Attends Religious Services: Patient declined    Database administrator or Organizations: Patient declined    Attends Banker Meetings: Patient declined    Marital Status: Widowed  Recent Concern: Social Connections - Socially Isolated (09/28/2023)   Social Connection and Isolation Panel    Frequency of Communication with Friends and Family: Never    Frequency of Social Gatherings with Friends and Family: Never    Attends Religious Services: Never    Database administrator or Organizations: No    Attends Banker Meetings: Never    Marital Status: Widowed  Intimate Partner Violence: Not At Risk (12/10/2023)   Humiliation, Afraid, Rape, and Kick questionnaire    Fear of Current or Ex-Partner: No    Emotionally Abused: No    Physically Abused: No    Sexually Abused: No    History reviewed. No pertinent family history.   Current Facility-Administered Medications:    acetaminophen  (TYLENOL ) tablet 650 mg, 650 mg, Oral, Q6H PRN, 650 mg at 12/16/23 1501 **OR** acetaminophen  (TYLENOL ) suppository 650 mg, 650 mg, Rectal, Q6H PRN, Sim, Mohammad L, MD   ALPRAZolam  (XANAX ) tablet 0.25 mg, 0.25 mg, Oral, BID PRN, Trudy Anthony HERO, MD, 0.25 mg at 12/11/23 1832   cefTRIAXone  (ROCEPHIN ) 2 g in sodium chloride  0.9 % 100 mL IVPB, 2 g, Intravenous, Q24H, Williams, Jamiese M, MD, Last Rate: 200 mL/hr at 12/15/23 2101, 2 g at 12/15/23 2101   Chlorhexidine  Gluconate Cloth 2 % PADS 6 each, 6 each, Topical, Daily, Dasie Tinnie MATSU, NP   diphenhydrAMINE  (BENADRYL ) capsule 50 mg, 50 mg, Oral, QHS, Trudy, Jamiese M, MD, 50 mg at 12/15/23 2050   escitalopram  (LEXAPRO ) tablet 5 mg, 5 mg, Oral, Daily, Trudy, Jamiese M, MD,  5 mg at 12/16/23 0942   feeding supplement (ENSURE PLUS HIGH PROTEIN) liquid 237 mL, 237 mL, Oral, BID BM, Garba, Mohammad L, MD, 237 mL at 12/16/23 1320   metroNIDAZOLE  (FLAGYL ) IVPB 500 mg, 500 mg, Intravenous, Q12H, Garba, Mohammad L, MD, Last Rate: 100 mL/hr at 12/16/23 0955, 500 mg at 12/16/23 0955   norethindrone  (AYGESTIN ) tablet 5 mg, 5 mg, Oral, TID, Wouk, Devaughn Sayres, MD, 5 mg at 12/16/23 1501   ondansetron  (ZOFRAN ) tablet 4 mg, 4 mg, Oral, Q6H PRN **OR** ondansetron  (ZOFRAN ) injection 4 mg, 4 mg, Intravenous, Q6H PRN, Sim Emery CROME, MD, 4 mg at 12/15/23 0524  Physical exam:  Vitals:   12/15/23 1952 12/16/23 0000 12/16/23 0459 12/16/23 0814  BP: 103/60 102/66 93/64 (!) 86/66  Pulse: (!) 107 91 86 89  Resp: (!) 24 20 20 18   Temp: 98.5 F (36.9 C)  98.3 F (36.8 C) 98 F (36.7 C)  TempSrc:    Oral  SpO2: 97% 99% 99% 99%  Weight:      Height:       Physical Exam Cardiovascular:     Rate and Rhythm: Normal rate and regular rhythm.     Heart sounds: Normal heart sounds.  Pulmonary:     Effort: Pulmonary effort is normal.     Breath sounds: Normal breath sounds.  Abdominal:     General: Bowel sounds are normal.     Palpations: Abdomen is soft.     Comments: No suprapubic tenderness  Skin:    General: Skin is warm and dry.  Neurological:     Mental Status: She is alert and oriented to person, place, and time.      I have personally reviewed labs listed below:    Latest Ref Rng & Units 12/16/2023    3:46 AM  CMP  Glucose 70 - 99 mg/dL 88   BUN 8 - 23 mg/dL 11   Creatinine 9.55 - 1.00 mg/dL 9.54   Sodium 864 - 854 mmol/L 137   Potassium 3.5 - 5.1 mmol/L 3.6   Chloride 98 - 111 mmol/L 104   CO2 22 - 32 mmol/L 26   Calcium  8.9 - 10.3 mg/dL 8.2       Latest Ref Rng & Units 12/16/2023    3:46 AM  CBC  WBC 4.0 - 10.5 K/uL 15.0   Hemoglobin 12.0 - 15.0 g/dL 9.5   Hematocrit 63.9 - 46.0 % 30.0   Platelets 150 - 400 K/uL 562    I have personally reviewed  Radiology images listed below: @IMAGES @  CT ABDOMEN PELVIS WO CONTRAST Result Date: 12/14/2023 EXAM: CT ABDOMEN AND PELVIS WITHOUT CONTRAST 12/14/2023 08:34:03 PM TECHNIQUE: CT of the abdomen and pelvis was performed without the administration of intravenous contrast. Multiplanar reformatted images are provided for review. Automated exposure control, iterative reconstruction, and/or weight-based adjustment of the mA/kV was utilized to reduce the radiation dose to as low as reasonably achievable. COMPARISON: 11/06/2023 CLINICAL HISTORY: Ureteral cancer, monitor; ? uterine infection. Has sepsis and unknown source. FINDINGS: LOWER CHEST: No acute abnormality. Unchanged 6 mm subpleural nodule in parenchyma (series 4, image 3). LIVER: The liver is unremarkable. GALLBLADDER AND BILE DUCTS: Gallbladder is unremarkable. No biliary ductal dilatation. SPLEEN: No acute abnormality. PANCREAS: No acute abnormality. ADRENAL GLANDS: No acute abnormality. KIDNEYS, URETERS AND BLADDER: No stones in the kidneys or ureters. No hydronephrosis. No perinephric or periureteral stranding. Urinary bladder is unremarkable. GI AND BOWEL: Stomach demonstrates no acute abnormality. There is no bowel obstruction. PERITONEUM AND RETROPERITONEUM: No ascites. No free air. VASCULATURE: Aortic atherosclerotic calcification. LYMPH NODES: Interval increase in size of multiple retroperitoneal lymph nodes. For example, a left periaortic lymph node measuring 10 mm, following series 2, image 35, previously measured 7 mm. A 20 mm right external iliac node on series 2, image 63 previously measured 18 mm. A 16 mm left external iliac node on series 263, previously measured 14 mm. REPRODUCTIVE ORGANS: Evaluation of the known mass in the uterus is limited without IV contrast. There is mass-like expansion centered in the lower uterine segment with heterogeneous fluid in the uterine fundus. Findings are similar to CT dated 11/06/23, given the difference is in  technique. 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 No acute osseous abnormality. No focal soft tissue abnormality. IMPRESSION: 1.  Limited evaluation of the known uterine mass without IV contrast. Findings are similar to prior CT with contrast dated 11/06/23, given the difference in technique. 2. Interval increase in size of multiple retroperitoneal lymph nodes. Electronically signed by: Norman Gatlin MD 12/14/2023 09:05 PM EDT RP Workstation: [id number]   DG Chest Port 1 View Result Date: 12/10/2023 CLINICAL DATA:  Infection. EXAM: PORTABLE CHEST 1 VIEW COMPARISON:  11/22/2023. FINDINGS: The heart size and mediastinal contours are unchanged. No focal consolidation, pleural effusion, or pneumothorax. No acute osseous abnormality. IMPRESSION: No acute cardiopulmonary findings. Electronically Signed   By: Harrietta Sherry M.D.   On: 12/10/2023 16:20   IR PATIENT EVAL TECH 0-60 MINS Result Date: 12/10/2023 Delight Bebe SAILOR     12/10/2023 12:32 PM Procedure cancelled order used for wasted supplies  DG Chest Portable 1 View Result Date: 11/22/2023 CLINICAL DATA:  Fatigue, hypotension EXAM: PORTABLE CHEST 1 VIEW COMPARISON:  09/28/2023 FINDINGS: Single frontal view of the chest demonstrates an unremarkable cardiac silhouette. No airspace disease, effusion, or pneumothorax. No acute bony abnormalities. IMPRESSION: 1. No acute intrathoracic process. Electronically Signed   By: Ozell Daring M.D.   On: 11/22/2023 15:44     Assessment and plan- Patient is a 68 y.o. female with history of stage IIIc endometrioid endometrial carcinoma newly diagnosed yet to start treatment admitted for severe sepsis  Severe sepsis: Etiology unclear.  On admission patient had evidence of hypotension and leukocytosis tachycardia and fever.  Fever and tachycardia has resolved.  Mild leukocytosis persists.  Blood pressure has been fluctuating between 80s 200s but overall patient is asymptomatic from that.  Infectious  workup including blood and urine cultures have been negative.  CT abdomen did not show any acute pathology.  ID was consulted and it was recommended to rule out any intra uterine infection as a cause of fever and leukocytosis.  Patient had pelvic exam at the cancer center today by GYN oncology and I was present during the exam.  There was cervical involvement with endometrial carcinoma and ongoing vaginal bleeding during the procedure.  However there was no suprapubic tenderness or purulent discharge during the exam that would suggest any intrauterine infection.  There was some clotted specimen received possibly tumor which would be sent out for additional test to rule out any infection.  As of now it would be okay to continue with IV antibiotics  Main concern presently is vaginal bleeding and patient underwent vaginal packing although it could not be done optimally due to patient discomfort and knee pain.  Foley catheter was also inserted.  Radiation oncology has been consulted and they will start off with local vaginal radiation to control her bleeding and plan to [facility] her tomorrow.  Foley and vaginal packing will be removed over the weekend by NP Tinnie Dawn.  I would like the patient to get at least a couple of radiation treatments in house to control her bleeding before she is discharged.  I am deferring chemotherapy at this time by 2 weeks to allow for palliative radiation to be completed.  I have updated patient's sister over the phone today.  Once vaginal bleeding decreases/resolves patient can be discharged on oral antibiotics.  I am discontinuing pelvic ultrasound at this time.  Patient has been on IV antibiotics for close to 5 days now and given that she is otherwise hemodynamically stable and afebrile I think would be okay to proceed with port placement tomorrow in anticipation to start chemotherapy in the near future.  Visit Diagnosis 1. Severe sepsis (HCC)   2. Vaginal bleeding   3.  Endometrial cancer [facility])      Dr. Annah Skene, MD, MPH [facility] [phone] 12/16/2023 3:43 PM

## 2023-12-16 NOTE — TOC Progression Note (Signed)
 Transition of Care [facility]) - Progression Note    Patient Details  Name: Valerie Leonard MRN: [id number] Date of Birth: May 14, 1955  [facility] CM/SW Contact  [place] L Raffaela Ladley, [place] Phone Number: 12/16/2023, 1:08 PM  Clinical Narrative:     Bed offers reviewed with sister, Ms. Brien. Ms. Brien accepted bed from [facility]. CSW will proceed with insurance auth if needed. CSW will speak with attending to confirm medical readiness.    Expected Discharge Plan: Skilled Nursing Facility Barriers to Discharge: Continued Medical Work up               Expected Discharge Plan and Services In-house Referral: Clinical Social Work Discharge Planning Services: CM Consult Post Acute Care Choice: Skilled Nursing Facility Living arrangements for the past 2 months: Single Family Home                 DME Arranged: N/A DME Agency: NA       HH Arranged: NA HH Agency: NA         Social Drivers of Health (SDOH) Interventions SDOH Screenings   Food Insecurity: No Food Insecurity (12/10/2023)  Recent Concern: Food Insecurity - Food Insecurity Present (09/27/2023)  Housing: Low Risk  (12/10/2023)  Transportation Needs: No Transportation Needs (12/10/2023)  Recent Concern: Transportation Needs - Unmet Transportation Needs (09/27/2023)  Utilities: Not At Risk (12/10/2023)  Depression (PHQ2-9): Low Risk  (12/10/2023)  Financial Resource Strain: Low Risk  (10/13/2023)   Received from [facility]  Social Connections: Unknown (12/11/2023)  Recent Concern: Social Connections - Socially Isolated (09/28/2023)  Tobacco Use: Medium Risk (12/11/2023)    Readmission Risk Interventions     No data to display

## 2023-12-16 NOTE — Progress Notes (Addendum)
 PROGRESS NOTE   HPI was taken from Dr. Sim: Valerie Leonard is a 68 y.o. female with medical history significant of endometrial cancer not yet on chemotherapy, depressive disorder, history of alcoholism, who was at the cancer center today to get her access for initiating chemotherapy.  She was noted to have fever and concern for sepsis was raised.  She was subsequently sent over to to the ER for evaluation.  She had recent UTI for which she was treated with cefdinir .  The urine culture was nondiagnostic with multiple species.  Today however patient noted to have white count of 26,000.Lactic acid 2.2.  She had a temperature 102.2.  Also hypotensive on arrival with systolic of 70 and diastolic 48.  Patient appears to have severe sepsis.  The source is not clear but suspicion for possible bacteremia.  Patient is therefore being admitted with sepsis of unknown source.  She is confused.  Appears to have acute metabolic encephalopathy from the incident.     Valerie Leonard  FMW:[id number] DOB: 28-Jul-1955 DOA: 12/10/2023 PCP: Pcp, No    Assessment & Plan:   Principal Problem:   Sepsis (HCC) Active Problems:   Chronic alcohol abuse   Severe major depression, single episode, without psychotic features (HCC)   Anemia   Endometrial cancer (HCC)   Endometrial carcinoma (HCC)  Assessment and Plan: SIRS:  No obvious source   Blood cxs NGTD. Urine cx shows no growth. CXR was neg. RSV, influenza, & COVID19 are all neg. New diarrhea but has had multiple stool softeners. Continue on IV rocephin , flagyl . ID consulted.  . CT/abd pelvis had limited evaluation of uterine mass. Pelvic u/s ordered for further eval, pyometra possible though denies pelvic pain.  Diarrhea: new today, has had multiple stool softeners. Denies this as presenting complaint so less likely this is source of possible infection. Will hold stool softeners, check gi pathogen panel. Consider c diff eval if persists, is on abx   Endometrial  cancer: stage III. Oncologist is Dr. Melanee. Still w/ vaginal bleeding. Tentative plan for port today. ID consulted. Onco following and recs apprec, they will be reaching out to gyn onc today. Will start norethindrone  today for the bleeding.   ACD: secondary to endometrial bleed. S/p 1 unit of pRBCs transfused so far. S/p IV iron  x 1 as per onco. H&H stable for now  Hx of alcohol abuse: remote hx. Will continue to monitor     Acute metabolic encephalopathy: resolved  Hypotension: persists. With above bleeding. Relatively asymptomatic. Will give a 1 liter bolus. Treating bleeding as above      DVT prophylaxis: SCDs Code Status: full  Family Communication: none at bedside Disposition Plan: snf, toc consulted  Level of care: Telemetry Medical  Status is: Inpatient Remains inpatient appropriate because: severity of illness, requiring IV abxs    Consultants:  ID Onco   Procedures:   Antimicrobials: rocephin , flagyl    Subjective: No pain or fevers. Loose BM this morning. Ongoing vaginal bleeding  Objective: Vitals:   12/15/23 1952 12/16/23 0000 12/16/23 0459 12/16/23 0814  BP: 103/60 102/66 93/64 (!) 86/66  Pulse: (!) 107 91 86 89  Resp: (!) 24 20 20 18   Temp: 98.5 F (36.9 C)  98.3 F (36.8 C) 98 F (36.7 C)  TempSrc:    Oral  SpO2: 97% 99% 99% 99%  Weight:      Height:        Intake/Output Summary (Last 24 hours) at 12/16/2023 0935 Last data filed  at 12/15/2023 1716 Gross per 24 hour  Intake 300 ml  Output --  Net 300 ml   Filed Weights   12/10/23 1517  Weight: 49 kg    Examination:  General exam: chronically ill appearing Respiratory system: clear breath sounds b/l Cardiovascular system: S1 & S2+. No rubs or clicks   Gastrointestinal system: abd is soft, NT  Central nervous system: alert & awake. Moves all extremities  Psychiatry: Judgement and insight appears normal    Data Reviewed: I have personally reviewed following labs and imaging  studies  CBC: Recent Labs  Lab 12/10/23 1153 12/10/23 1520 12/11/23 0445 12/12/23 0737 12/13/23 0409 12/14/23 0502 12/15/23 0507 12/16/23 0346  WBC 25.9* 27.1*   < > 16.0* 12.6* 13.1* 16.9* 15.0*  NEUTROABS 23.0* 23.3*  --   --   --   --   --   --   HGB 9.9* 8.7*   < > 9.9* 10.0* 10.1* 9.9* 9.5*  HCT 32.0* 28.2*   < > 31.5* 31.6* 31.9* 31.7* 30.0*  MCV 82.9 83.7   < > 82.9 83.6 82.6 83.6 83.1  PLT 679* 611*   < > 506* 501* 575* 607* 562*   < > = values in this interval not displayed.   Basic Metabolic Panel: Recent Labs  Lab 12/12/23 0737 12/13/23 0409 12/14/23 0502 12/15/23 0507 12/16/23 0346  NA 135 134* 134* 134* 137  K 3.1* 4.3 4.1 3.6 3.6  CL 104 100 98 100 104  CO2 26 26 25 23 26   GLUCOSE 104* 97 106* 90 88  BUN <5* 8 7* 7* 11  CREATININE 0.47 0.42* 0.38* 0.38* 0.45  CALCIUM  8.0* 8.5* 8.7* 8.3* 8.2*   GFR: Estimated Creatinine Clearance: 51.5 mL/min (by C-G formula based on SCr of 0.45 mg/dL). Liver Function Tests: Recent Labs  Lab 12/10/23 1153 12/10/23 1520 12/11/23 0445  AST 26 25 16   ALT 13 11 9   ALKPHOS 71 63 45  BILITOT 0.6 0.5 0.5  PROT 8.0 7.3 5.4*  ALBUMIN 2.7* 2.3* 1.7*   No results for input(s): LIPASE, AMYLASE in the last 168 hours. No results for input(s): AMMONIA in the last 168 hours. Coagulation Profile: Recent Labs  Lab 12/11/23 0445  INR 1.4*   Cardiac Enzymes: No results for input(s): CKTOTAL, CKMB, CKMBINDEX, TROPONINI in the last 168 hours. BNP (last 3 results) No results for input(s): PROBNP in the last 8760 hours. HbA1C: No results for input(s): HGBA1C in the last 72 hours. CBG: No results for input(s): GLUCAP in the last 168 hours. Lipid Profile: No results for input(s): CHOL, HDL, LDLCALC, TRIG, CHOLHDL, LDLDIRECT in the last 72 hours. Thyroid  Function Tests: No results for input(s): TSH, T4TOTAL, FREET4, T3FREE, THYROIDAB in the last 72 hours. Anemia Panel: No results  for input(s): VITAMINB12, FOLATE, FERRITIN, TIBC, IRON , RETICCTPCT in the last 72 hours. Sepsis Labs: Recent Labs  Lab 12/10/23 1520 12/10/23 2040 12/15/23 0507  PROCALCITON 5.76  --  1.85  LATICACIDVEN 2.2* 1.3  --     Recent Results (from the past 240 hours)  Blood culture (routine x 2)     Status: None (Preliminary result)   Collection Time: 12/10/23  3:20 PM   Specimen: BLOOD  Result Value Ref Range Status   Specimen Description BLOOD RIGHT ANTECUBITAL  Final   Special Requests   Final    BOTTLES DRAWN AEROBIC AND ANAEROBIC Blood Culture adequate volume   Culture   Final    NO GROWTH 4 DAYS Performed at  [facility], [address] [place], [place] [address]    Report Status PENDING  Incomplete  Resp panel by RT-PCR (RSV, Flu A&B, Covid) Anterior Nasal Swab     Status: None   Collection Time: 12/10/23  4:25 PM   Specimen: Anterior Nasal Swab  Result Value Ref Range Status   SARS Coronavirus 2 by RT PCR NEGATIVE NEGATIVE Final    Comment: (NOTE) SARS-CoV-2 target nucleic acids are NOT DETECTED.  The SARS-CoV-2 RNA is generally detectable in upper respiratory specimens during the acute phase of infection. The lowest concentration of SARS-CoV-2 viral copies this assay can detect is 138 copies/mL. A negative result does not preclude SARS-Cov-2 infection and should not be used as the sole basis for treatment or other patient management decisions. A negative result may occur with  improper specimen collection/handling, submission of specimen other than nasopharyngeal swab, presence of viral mutation(s) within the areas targeted by this assay, and inadequate number of viral copies(<138 copies/mL). A negative result must be combined with clinical observations, patient history, and epidemiological information. The expected result is Negative.  Fact Sheet for Patients:  BloggerCourse.com  Fact Sheet for Healthcare Providers:   SeriousBroker.it  This test is no t yet approved or cleared by the [place]  FDA and  has been authorized for detection and/or diagnosis of SARS-CoV-2 by FDA under an Emergency Use Authorization (EUA). This EUA will remain  in effect (meaning this test can be used) for the duration of the COVID-19 declaration under Section 564(b)(1) of the Act, 21 U.S.C.section 360bbb-3(b)(1), unless the authorization is terminated  or revoked sooner.       Influenza A by PCR NEGATIVE NEGATIVE Final   Influenza B by PCR NEGATIVE NEGATIVE Final    Comment: (NOTE) The Xpert Xpress SARS-CoV-2/FLU/RSV plus assay is intended as an aid in the diagnosis of influenza from Nasopharyngeal swab specimens and should not be used as a sole basis for treatment. Nasal washings and aspirates are unacceptable for Xpert Xpress SARS-CoV-2/FLU/RSV testing.  Fact Sheet for Patients: BloggerCourse.com  Fact Sheet for Healthcare Providers: SeriousBroker.it  This test is not yet approved or cleared by the [place]  FDA and has been authorized for detection and/or diagnosis of SARS-CoV-2 by FDA under an Emergency Use Authorization (EUA). This EUA will remain in effect (meaning this test can be used) for the duration of the COVID-19 declaration under Section 564(b)(1) of the Act, 21 U.S.C. section 360bbb-3(b)(1), unless the authorization is terminated or revoked.     Resp Syncytial Virus by PCR NEGATIVE NEGATIVE Final    Comment: (NOTE) Fact Sheet for Patients: BloggerCourse.com  Fact Sheet for Healthcare Providers: SeriousBroker.it  This test is not yet approved or cleared by the [place]  FDA and has been authorized for detection and/or diagnosis of SARS-CoV-2 by FDA under an Emergency Use Authorization (EUA). This EUA will remain in effect (meaning this test can be used) for  the duration of the COVID-19 declaration under Section 564(b)(1) of the Act, 21 U.S.C. section 360bbb-3(b)(1), unless the authorization is terminated or revoked.  Performed at [facility], [address], [place], [place] [address]   Urine Culture     Status: None   Collection Time: 12/10/23  4:25 PM   Specimen: Urine, Clean Catch  Result Value Ref Range Status   Specimen Description   Final    URINE, CLEAN CATCH Performed at [facility], [address] [place], [place] [address]    Special Requests   Final  NONE Performed at [facility], [address], [place], [place] [address]    Culture   Final    NO GROWTH Performed at [facility], [address] [place], [place] [address]    Report Status 12/11/2023 FINAL  Final  Blood culture (routine x 2)     Status: None (Preliminary result)   Collection Time: 12/10/23  8:41 PM   Specimen: BLOOD  Result Value Ref Range Status   Specimen Description BLOOD BLOOD LEFT HAND [facility]  Final   Special Requests   Final    BOTTLES DRAWN AEROBIC AND ANAEROBIC Blood Culture adequate volume   Culture   Final    NO GROWTH 4 DAYS Performed at [facility], [address] [place], [place] [address]    Report Status PENDING  Incomplete  MRSA Next Gen by PCR, Nasal     Status: None   Collection Time: 12/12/23 10:28 AM   Specimen: Nasal Mucosa; Nasal Swab  Result Value Ref Range Status   MRSA by PCR Next Gen NOT DETECTED NOT DETECTED Final    Comment: (NOTE) The GeneXpert MRSA Assay (FDA approved for NASAL specimens only), is one component of a comprehensive MRSA colonization surveillance program. It is not intended to diagnose MRSA infection nor to guide or monitor treatment for MRSA infections. Test performance is not FDA approved in patients less than 17 years old. Performed at [facility], [address] [place], [place] [address]          Radiology Studies: CT  ABDOMEN PELVIS WO CONTRAST Result Date: 12/14/2023 EXAM: CT ABDOMEN AND PELVIS WITHOUT CONTRAST 12/14/2023 08:34:03 PM TECHNIQUE: CT of the abdomen and pelvis was performed without the administration of intravenous contrast. Multiplanar reformatted images are provided for review. Automated exposure control, iterative reconstruction, and/or weight-based adjustment of the mA/kV was utilized to reduce the radiation dose to as low as reasonably achievable. COMPARISON: 11/06/2023 CLINICAL HISTORY: Ureteral cancer, monitor; ? uterine infection. Has sepsis and unknown source. FINDINGS: LOWER CHEST: No acute abnormality. Unchanged 6 mm subpleural nodule in parenchyma (series 4, image 3). LIVER: The liver is unremarkable. GALLBLADDER AND BILE DUCTS: Gallbladder is unremarkable. No biliary ductal dilatation. SPLEEN: No acute abnormality. PANCREAS: No acute abnormality. ADRENAL GLANDS: No acute abnormality. KIDNEYS, URETERS AND BLADDER: No stones in the kidneys or ureters. No hydronephrosis. No perinephric or periureteral stranding. Urinary bladder is unremarkable. GI AND BOWEL: Stomach demonstrates no acute abnormality. There is no bowel obstruction. PERITONEUM AND RETROPERITONEUM: No ascites. No free air. VASCULATURE: Aortic atherosclerotic calcification. LYMPH NODES: Interval increase in size of multiple retroperitoneal lymph nodes. For example, a left periaortic lymph node measuring 10 mm, following series 2, image 35, previously measured 7 mm. A 20 mm right external iliac node on series 2, image 63 previously measured 18 mm. A 16 mm left external iliac node on series 263, previously measured 14 mm. REPRODUCTIVE ORGANS: Evaluation of the known mass in the uterus is limited without IV contrast. There is mass-like expansion centered in the lower uterine segment with heterogeneous fluid in the uterine fundus. Findings are similar to CT dated 11/06/23, given the difference is in technique. 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 No acute osseous abnormality. No focal soft tissue abnormality. IMPRESSION: 1. Limited evaluation of the known uterine mass without IV contrast. Findings are similar to prior CT with contrast dated 11/06/23, given the difference in technique. 2. Interval increase in size of multiple retroperitoneal lymph nodes. Electronically  signed by: Norman Gatlin MD 12/14/2023 09:05 PM EDT RP Workstation: [id number]         Scheduled Meds:  diphenhydrAMINE   50 mg Oral QHS   escitalopram   5 mg Oral Daily   feeding supplement  237 mL Oral BID BM   norethindrone   5 mg Oral TID   Continuous Infusions:  cefTRIAXone  (ROCEPHIN )  IV 2 g (12/15/23 2101)   metronidazole  500 mg (12/15/23 2140)     LOS: 6 days      Devaughn KATHEE Ban, MD [facility]   If 7PM-7AM, please contact night-coverage [email or web address] 12/16/2023, 9:35 AM

## 2023-12-16 NOTE — Progress Notes (Signed)
 The above named patient is recommended to go to Short Term Rehab for strengthening and gait training for balance.  It is expected that the Short Term Rehab stay will be less than 30 days.  The patient is expected to return home after Rehab.

## 2023-12-16 NOTE — Progress Notes (Signed)
 Initial Nutrition Assessment  DOCUMENTATION CODES:   Severe malnutrition in context of chronic illness  INTERVENTION:   -Continue regular diet -Ensure Plus High Protein po TID, each supplement provides 350 kcal and 20 grams of protein  -MVI with minerals daily -1 mg folic acid  daily -100 mg thiamine  daily  NUTRITION DIAGNOSIS:   Severe Malnutrition related to chronic illness as evidenced by moderate fat depletion, severe fat depletion, moderate muscle depletion, severe muscle depletion, percent weight loss.  GOAL:   Patient will meet greater than or equal to 90% of their needs  MONITOR:   PO intake, Supplement acceptance  REASON FOR ASSESSMENT:   Malnutrition Screening Tool    ASSESSMENT:   Pt with medical history significant of endometrial cancer not yet on chemotherapy, depressive disorder, history of alcoholism, who was at the cancer center today to get her access for initiating chemotherapy  Pt admitted with severe sepsis and endometrial cancer.   9/9- port-a-cath placement deferred  Reviewed I/O's: +300 ml x 24 hours and +4.2 L since admission   Plan for port placement today for chemotherapy  Spoke with pt at bedside, who was pleasant and in good spirits today. Pt reports feel as good as I can be today. Pt very eager to engage RD in conversation and discussed support system of sister, grandchildren, and neighbors, as well as her hobbies. Pt very tangential at time of visit and difficult to get pt to stay on topic.   Pt reports that she has a fair appetite. She generally consumes 2 meals per day: Breakfast: (coffee, yogurt, and fruit) and Dinner (meat, starch, and vegetable). Pt also reports drinking one glass of red wine daily. Noted pt with multiple missing teeth; pt denies need for mechanically altered diet.   Pt endorses wt loss, but reports she is not concerned about it. She shares she used to weight 200# and has progressively lost weight over the years.  Per pt, I'm now down to 106# and I think that is good for my height. Reviewed wt hx; pt has experienced a 10.1% wt loss over the past month, which is significant for time frame.   Discussed importance of good meal and supplement intake to promote healing. Pt amenable to supplements.   Per TOC notes, plan for SNF placement at discharge.   Medications reviewed and include benadryl .  Labs reviewed.     NUTRITION - FOCUSED PHYSICAL EXAM:  Flowsheet Row Most Recent Value  Orbital Region Moderate depletion  Upper Arm Region Severe depletion  Thoracic and Lumbar Region Severe depletion  Buccal Region Moderate depletion  Temple Region Moderate depletion  Clavicle Bone Region Severe depletion  Clavicle and Acromion Bone Region Severe depletion  Scapular Bone Region Severe depletion  Dorsal Hand Severe depletion  Patellar Region Severe depletion  Anterior Thigh Region Severe depletion  Posterior Calf Region Severe depletion  Edema (RD Assessment) None  Hair Reviewed  Eyes Reviewed  Mouth Reviewed  Skin Reviewed  Nails Reviewed    Diet Order:   Diet Order             Diet NPO time specified  Diet effective midnight           Diet regular Room service appropriate? Yes; Fluid consistency: Thin  Diet effective now                   EDUCATION NEEDS:   Education needs have been addressed  Skin:  Skin Assessment: Reviewed RN Assessment  Last BM:  12/16/23 (type 7)  Height:   Ht Readings from Last 1 Encounters:  12/10/23 5' 1 (1.549 m)    Weight:   Wt Readings from Last 1 Encounters:  12/10/23 49 kg    Ideal Body Weight:  47.7 kg  BMI:  Body mass index is 20.41 kg/m.  Estimated Nutritional Needs:   Kcal:  1750-1950  Protein:  90-105 grams  Fluid:  1.7-1.9 L    Margery ORN, RD, LDN, CDCES Registered Dietitian III Certified Diabetes Care and Education Specialist If unable to reach this RD, please use RD Inpatient group chat on secure chat between  hours of 8am-4 pm daily

## 2023-12-16 NOTE — Plan of Care (Signed)
 The patient is alert and forgetful. The patient had a new IV placed in her RFA by the IV team. The patient has been NPO after MN for porta catheter placement later today. No s/s of acute distress noted.

## 2023-12-16 NOTE — Consult Note (Signed)
 Gynecologic Oncology Hospital Consult Visit   Chief Complaint: Sepsis  Subjective:  Valerie Leonard is a 68 y.o. female who is seen in consultation for evaluation of possible pyometra as source of infection in setting of known endometrial cancer.   Patient is a 68 year old female with new diagnosis of stage IIIc endometrioid endometrial cancer.  She was planning to start carbo/Taxol chemotherapy with medical oncology this week however, when she went for port placement, she was noted to be febrile.  She was evaluated in the cancer center by myself and noted to have leukocytosis with a white blood cell count of 23, hypotensive, and febrile to 102.  She did not respond to fluid resuscitation and was transferred to the ER.  She has received IV antibiotics including cefepime  and Flagyl  that the source of her infection has been unknown.  Urine cultures and blood cultures were negative.  No obvious respiratory infections.  No wounds.  She continues to have vaginal bleeding but no discharge.  White blood cell count has improved to her baseline which is still elevated and somewhat hypotensive.  Heart rate has normalized.  Lactic acid levels have also normalized.  She met with infectious disease who ordered CT scan and recommended pelvic exam for evaluation of possible pyometra source of infection.    Problem List: Patient Active Problem List   Diagnosis Date Noted   SIRS (systemic inflammatory response syndrome) (HCC) 12/16/2023   Postmenopausal bleeding 12/16/2023   Endometrial carcinoma (HCC) 12/14/2023   HFrEF (heart failure with reduced ejection fraction) (HCC) 12/03/2023   Endometrial cancer (HCC) 11/11/2023   Anemia 11/04/2023   ABLA (acute blood loss anemia) 09/28/2023   Pressure injury of skin 09/28/2023   Vaginal bleeding 09/27/2023   Severe major depression, single episode, without psychotic features (HCC) 07/26/2020   Depression    Acute delirium 07/25/2020   Acute metabolic encephalopathy     Hypomagnesemia    Chronic alcohol abuse    Hypokalemia 07/24/2020   Past Medical History: Past Medical History:  Diagnosis Date   Acute delirium    Acute metabolic encephalopathy    Anemia    blood loss anemia   Chronic alcohol abuse    Depression    Endometrial cancer (HCC)    Hypokalemia    Hypomagnesemia    Pressure injury of skin    Severe major depression, single episode, without psychotic features (HCC)    Vaginal bleeding    Past Surgical History: Past Surgical History:  Procedure Laterality Date   IR PATIENT EVAL TECH 0-60 MINS  12/10/2023   NO PAST SURGERIES      Past Gynecologic History:  See prior note for details  OB History:  OB History  No obstetric history on file.   Family History: History reviewed. No pertinent family history.  Social History: Social History   Socioeconomic History   Marital status: Single    Spouse name: Not on file   Number of children: 0   Years of education: Not on file   Highest education level: Not on file  Occupational History   Not on file  Tobacco Use   Smoking status: Former    Types: Cigarettes, Cigars   Smokeless tobacco: Never  Vaping Use   Vaping status: Never Used  Substance and Sexual Activity   Alcohol use: Yes    Alcohol/week: 3.0 standard drinks of alcohol    Types: 3 Cans of beer per week    Comment: More than 4 glasses of wine, mixed  drinks, beer daily.    Drug use: Not Currently    Comment: former use of marijuana   Sexual activity: Not Currently    Birth control/protection: None  Other Topics Concern   Not on file  Social History Narrative   Lives by herself    Social Drivers of Health   Financial Resource Strain: Low Risk  (10/13/2023)   Received from [facility]   Overall Financial Resource Strain (CARDIA)    Difficulty of Paying Living Expenses: Not very hard  Food Insecurity: No Food Insecurity (12/10/2023)   Hunger Vital Sign    Worried About Running Out of Food in  the Last Year: Never true    Ran Out of Food in the Last Year: Never true  Recent Concern: Food Insecurity - Food Insecurity Present (09/27/2023)   Hunger Vital Sign    Worried About Running Out of Food in the Last Year: Sometimes true    Ran Out of Food in the Last Year: Sometimes true  Transportation Needs: No Transportation Needs (12/10/2023)   PRAPARE - Administrator, Civil Service (Medical): No    Lack of Transportation (Non-Medical): No  Recent Concern: Transportation Needs - Unmet Transportation Needs (09/27/2023)   PRAPARE - Administrator, Civil Service (Medical): Yes    Lack of Transportation (Non-Medical): Yes  Physical Activity: Not on file  Stress: Not on file  Social Connections: Unknown (12/11/2023)   Social Connection and Isolation Panel    Frequency of Communication with Friends and Family: More than three times a week    Frequency of Social Gatherings with Friends and Family: More than three times a week    Attends Religious Services: Patient declined    Database administrator or Organizations: Patient declined    Attends Banker Meetings: Patient declined    Marital Status: Widowed  Recent Concern: Social Connections - Socially Isolated (09/28/2023)   Social Connection and Isolation Panel    Frequency of Communication with Friends and Family: Never    Frequency of Social Gatherings with Friends and Family: Never    Attends Religious Services: Never    Database administrator or Organizations: No    Attends Banker Meetings: Never    Marital Status: Widowed  Intimate Partner Violence: Not At Risk (12/10/2023)   Humiliation, Afraid, Rape, and Kick questionnaire    Fear of Current or Ex-Partner: No    Emotionally Abused: No    Physically Abused: No    Sexually Abused: No   Allergies: No Known Allergies  Current Medications: Current Facility-Administered Medications  Medication Dose Route Frequency Provider Last Rate  Last Admin   acetaminophen  (TYLENOL ) tablet 650 mg  650 mg Oral Q6H PRN Garba, Mohammad L, MD   650 mg at 12/14/23 1302   Or   acetaminophen  (TYLENOL ) suppository 650 mg  650 mg Rectal Q6H PRN Sim Emery CROME, MD       ALPRAZolam  (XANAX ) tablet 0.25 mg  0.25 mg Oral BID PRN Trudy Anthony HERO, MD   0.25 mg at 12/11/23 1832   cefTRIAXone  (ROCEPHIN ) 2 g in sodium chloride  0.9 % 100 mL IVPB  2 g Intravenous Q24H Trudy Anthony HERO, MD 200 mL/hr at 12/15/23 2101 2 g at 12/15/23 2101   diphenhydrAMINE  (BENADRYL ) capsule 50 mg  50 mg Oral QHS Trudy Anthony HERO, MD   50 mg at 12/15/23 2050   escitalopram  (LEXAPRO ) tablet 5 mg  5 mg Oral  Daily Trudy Anthony HERO, MD   5 mg at 12/16/23 9057   feeding supplement (ENSURE PLUS HIGH PROTEIN) liquid 237 mL  237 mL Oral BID BM Sim Re L, MD   237 mL at 12/16/23 1320   metroNIDAZOLE  (FLAGYL ) IVPB 500 mg  500 mg Intravenous Q12H Sim Re L, MD 100 mL/hr at 12/16/23 0955 500 mg at 12/16/23 0955   norethindrone  (AYGESTIN ) tablet 5 mg  5 mg Oral TID Kandis Devaughn Sayres, MD   5 mg at 12/16/23 1144   ondansetron  (ZOFRAN ) tablet 4 mg  4 mg Oral Q6H PRN Sim Re CROME, MD       Or   ondansetron  (ZOFRAN ) injection 4 mg  4 mg Intravenous Q6H PRN Sim Re CROME, MD   4 mg at 12/15/23 9475   Review of Systems General:  no complaints Skin: no complaints Eyes: no complaints HEENT: no complaints Breasts: no complaints Pulmonary: no complaints Cardiac: no complaints Gastrointestinal: no complaints Genitourinary/Sexual: vaginal bleeding Ob/Gyn: no complaints Musculoskeletal: no complaints Hematology: no complaints Neurologic/Psych: grief  Objective:  Physical Examination:  BP (!) 86/66 (BP Location: Right Arm)   Pulse 89   Temp 98 F (36.7 C) (Oral)   Resp 18   Ht 5' 1 (1.549 m)   Wt 108 lb (49 kg)   SpO2 99%   BMI 20.41 kg/m     ECOG Performance Status: 3 - Symptomatic, >50% confined to bed  Patient brought to cancer center  for pelvic exam.  General appearance: alert, cooperative, and appears older than stated age Respiratory: normal respiratory effort Abdomen: Soft nondistended nontender. No masses or ascites. No significant suprapubic tenderness Neurological exam reveals alert, oriented, normal speech, no focal findings or movement disorder noted.  Pelvic: Exam assisted by 2 RNs, CMA, NP, MD. She has severe contractures and can not tolerate lithotomy position. The exam is very difficult. Unable to do speculum exam.  EGBUS: no lesions Cervix: nontender; firm. Tumor mass 2-3 cm extruding from the cervix appreciated on digital exam. Fragment of tumor removed and sent for pathology.  Uterus: The fundus does not seem to be tender; exam is very limited. Unable to fully assess mobility Adnexa: no palpable ovarian masses  Profuse bleeding noted after exam requiring packing with Kerlix for control. Foley catheter placed using appropriate sterile technique. Packing resumed and placed with difficulty but sufficient to control bleeding. Packing secured to the Foley.   Lab Review Labs on site today: Lab Results  Component Value Date   WBC 15.0 (H) 12/16/2023   HGB 9.5 (L) 12/16/2023   HCT 30.0 (L) 12/16/2023   MCV 83.1 12/16/2023   PLT 562 (H) 12/16/2023     Radiologic Imaging: CT ABDOMEN AND PELVIS WITHOUT CONTRAST 12/14/2023 08:34:03 PM   TECHNIQUE: CT of the abdomen and pelvis was performed without the administration of intravenous contrast. Multiplanar reformatted images are provided for review. Automated exposure control, iterative reconstruction, and/or weight-based adjustment of the mA/kV was utilized to reduce the radiation dose to as low as reasonably achievable.   COMPARISON: 11/06/2023   CLINICAL HISTORY: Ureteral cancer, monitor; ? uterine infection. Has sepsis and unknown source.   FINDINGS:   LOWER CHEST: No acute abnormality. Unchanged 6 mm subpleural nodule in parenchyma (series  4, image 3).   LIVER: The liver is unremarkable.   GALLBLADDER AND BILE DUCTS: Gallbladder is unremarkable. No biliary ductal dilatation.   SPLEEN: No acute abnormality.   PANCREAS: No acute abnormality.   ADRENAL GLANDS: No acute abnormality.  KIDNEYS, URETERS AND BLADDER: No stones in the kidneys or ureters. No hydronephrosis. No perinephric or periureteral stranding. Urinary bladder is unremarkable.   GI AND BOWEL: Stomach demonstrates no acute abnormality. There is no bowel obstruction.   PERITONEUM AND RETROPERITONEUM: No ascites. No free air.   VASCULATURE: Aortic atherosclerotic calcification.   LYMPH NODES: Interval increase in size of multiple retroperitoneal lymph nodes. For example, a left periaortic lymph node measuring 10 mm, following series 2, image 35, previously measured 7 mm. A 20 mm right external iliac node on series 2, image 63 previously measured 18 mm. A 16 mm left external iliac node on series 263, previously measured 14 mm.   REPRODUCTIVE ORGANS: Evaluation of the known mass in the uterus is limited without IV contrast. There is mass-like expansion centered in the lower uterine segment with heterogeneous fluid in the uterine fundus. Findings are similar to CT dated 11/06/23, given the difference is in technique.   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 No acute osseous abnormality. No focal soft tissue abnormality.   IMPRESSION: 1. Limited evaluation of the known uterine mass without IV contrast. Findings are similar to prior CT with contrast dated 11/06/23, given the difference in technique. 2. Interval increase in size of multiple retroperitoneal lymph nodes.      Assessment:  Valerie Leonard is a 68 y.o. female diagnosed with grade 2 endometrial cancer, dMMR, p53wt, ER+ with radiologic imaging concerning for stage III C1 disease and findings of pulmonary nodules are not definitive; now with obvious cervical involvement  and symptomatic with bleeding.   Plan:    We discussed options for management and I recommended radiation for palliation of bleeding.   She does not appear to have a pyometra based on either imaging or exam. She could have superimposed infection of her tumor given the bulky disease and a portion of tumor was submitted to pathology which can be assessed for inflammation.   Defer antibiotic management for the ID team.  Suggested return to clinic in 2-3 weeks. She will also be seeing the Radiation Oncology and Medical Oncology teams.   The patient's diagnosis, an outline of the further diagnostic and laboratory studies which will be required, the recommendation for surgery, and alternatives were discussed with her and her accompanying family members.  All questions were answered to their satisfaction.  A total of 60 minutes were spent with the patient today; >50% was spent in education, counseling and additional 15 minutes coordination of care with Dr. Lenn and Dr. Melanee for endometrial cancer, leukocytosis, and vaginal bleeding.   Tinnie Dawn, DNP, AGNP-C, AOCNP [facility] [phone] (clinic)  I personally had a face to face interaction and evaluated the patient jointly with the NP, Ms. Tinnie Dawn.  I have reviewed her history and available records and have performed the key portions of the physical exam including abdominal exam, pelvic exam, and vaginal packing with my findings confirming those documented above by the APP.  I have discussed the case with the APP and the patient.  I agree with the above documentation, assessment and plan which was fully formulated by me.  Counseling was completed by me.   I personally saw the patient and performed a substantive portion of this encounter in conjunction with the listed APP as documented above.  Raynie Steinhaus Isidor Constable, MD

## 2023-12-17 ENCOUNTER — Inpatient Hospital Stay: Admitting: Radiology

## 2023-12-17 ENCOUNTER — Ambulatory Visit: Admitting: Radiation Oncology

## 2023-12-17 ENCOUNTER — Other Ambulatory Visit: Payer: Self-pay

## 2023-12-17 ENCOUNTER — Ambulatory Visit

## 2023-12-17 DIAGNOSIS — R6510 Systemic inflammatory response syndrome (SIRS) of non-infectious origin without acute organ dysfunction: Secondary | ICD-10-CM | POA: Diagnosis not present

## 2023-12-17 DIAGNOSIS — R509 Fever, unspecified: Secondary | ICD-10-CM

## 2023-12-17 DIAGNOSIS — D72829 Elevated white blood cell count, unspecified: Secondary | ICD-10-CM

## 2023-12-17 DIAGNOSIS — C541 Malignant neoplasm of endometrium: Secondary | ICD-10-CM

## 2023-12-17 DIAGNOSIS — Z510 Encounter for antineoplastic radiation therapy: Secondary | ICD-10-CM | POA: Insufficient documentation

## 2023-12-17 HISTORY — PX: IR IMAGING GUIDED PORT INSERTION: IMG5740

## 2023-12-17 LAB — CBC
HCT: 29.5 % — ABNORMAL LOW (ref 36.0–46.0)
Hemoglobin: 9.2 g/dL — ABNORMAL LOW (ref 12.0–15.0)
MCH: 26 pg (ref 26.0–34.0)
MCHC: 31.2 g/dL (ref 30.0–36.0)
MCV: 83.3 fL (ref 80.0–100.0)
Platelets: 613 K/uL — ABNORMAL HIGH (ref 150–400)
RBC: 3.54 MIL/uL — ABNORMAL LOW (ref 3.87–5.11)
RDW: 17.6 % — ABNORMAL HIGH (ref 11.5–15.5)
WBC: 18.8 K/uL — ABNORMAL HIGH (ref 4.0–10.5)
nRBC: 0 % (ref 0.0–0.2)

## 2023-12-17 MED ORDER — FENTANYL CITRATE (PF) 100 MCG/2ML IJ SOLN
INTRAMUSCULAR | Status: AC | PRN
  Administered 2023-12-17: 50 ug via INTRAVENOUS
  Administered 2023-12-17 (×2): 25 ug via INTRAVENOUS

## 2023-12-17 MED ORDER — HEPARIN SOD (PORK) LOCK FLUSH 100 UNIT/ML IV SOLN
INTRAVENOUS | Status: AC
  Filled 2023-12-17: qty 5

## 2023-12-17 MED ORDER — FENTANYL CITRATE (PF) 100 MCG/2ML IJ SOLN
INTRAMUSCULAR | Status: AC
  Filled 2023-12-17: qty 2

## 2023-12-17 MED ORDER — MIDAZOLAM HCL 2 MG/2ML IJ SOLN
INTRAMUSCULAR | Status: AC
  Filled 2023-12-17: qty 2

## 2023-12-17 MED ORDER — LIDOCAINE-EPINEPHRINE 1 %-1:100000 IJ SOLN
10.0000 mL | Freq: Once | INTRAMUSCULAR | Status: AC
  Administered 2023-12-17: 10 mL via INTRADERMAL
  Filled 2023-12-17: qty 10

## 2023-12-17 MED ORDER — LIDOCAINE-EPINEPHRINE 1 %-1:100000 IJ SOLN
INTRAMUSCULAR | Status: AC
  Filled 2023-12-17: qty 1

## 2023-12-17 MED ORDER — HEPARIN SOD (PORK) LOCK FLUSH 100 UNIT/ML IV SOLN
500.0000 [IU] | Freq: Once | INTRAVENOUS | Status: AC
  Administered 2023-12-17: 500 [IU] via INTRAVENOUS

## 2023-12-17 MED ORDER — LIDOCAINE HCL (PF) 1 % IJ SOLN
INTRAMUSCULAR | Status: AC
  Filled 2023-12-17: qty 30

## 2023-12-17 MED ORDER — MIDAZOLAM HCL 2 MG/2ML IJ SOLN
INTRAMUSCULAR | Status: AC | PRN
  Administered 2023-12-17: 1 mg via INTRAVENOUS
  Administered 2023-12-17 (×2): .5 mg via INTRAVENOUS

## 2023-12-17 MED ORDER — LIDOCAINE HCL (PF) 1 % IJ SOLN
20.0000 mL | Freq: Once | INTRAMUSCULAR | Status: AC
  Administered 2023-12-17: 8 mL via INTRADERMAL
  Filled 2023-12-17: qty 20

## 2023-12-17 MED ORDER — SODIUM CHLORIDE 0.9 % IV SOLN
3.0000 g | Freq: Four times a day (QID) | INTRAVENOUS | Status: DC
  Administered 2023-12-17 – 2023-12-21 (×16): 3 g via INTRAVENOUS
  Filled 2023-12-17 (×18): qty 8

## 2023-12-17 NOTE — Progress Notes (Signed)
 Date of Admission:  12/10/2023      ID: Valerie Leonard is a 68 y.o. female Principal Problem:   SIRS (systemic inflammatory response syndrome) (HCC) Active Problems:   Chronic alcohol abuse   Severe major depression, single episode, without psychotic features (HCC)   Anemia   Endometrial cancer (HCC)   Endometrial carcinoma (HCC)   Postmenopausal bleeding   Protein-calorie malnutrition, severe  She had a port placed today She had GYN speculum examination yesterday by the gyn oncologist.  The tumor was extending up to cervical os There was bleeding Postprocedure she has been placed on a Foley catheter and vaginal packing has been done No culture was sent from the os Just a piece of tissue was sent for pathology.  Subjective: Pt states she is doing okay   Medications:   Chlorhexidine  Gluconate Cloth  6 each Topical Daily   diphenhydrAMINE   50 mg Oral QHS   escitalopram   5 mg Oral Daily   feeding supplement  237 mL Oral TID BM   folic acid   1 mg Oral Daily   multivitamin with minerals  1 tablet Oral Daily   norethindrone   5 mg Oral TID   thiamine   100 mg Per Tube Daily    Objective: Vital signs in last 24 hours: Patient Vitals for the past 24 hrs:  BP Temp Temp src Pulse Resp SpO2  12/17/23 0733 105/61 98.9 F (37.2 C) Oral 87 18 98 %  12/17/23 0528 98/68 (!) 97.5 F (36.4 C) -- 88 18 96 %  12/16/23 2004 (!) 90/54 98 F (36.7 C) -- 91 17 98 %  12/16/23 1655 91/61 97.6 F (36.4 C) -- 93 18 98 %       PHYSICAL EXAM:  General: Alert, cooperative, no distress, appears stated age. Port in place Lungs: Clear to auscultation bilaterally. No Wheezing or Rhonchi. No rales. Heart: Regular rate and rhythm, no murmur, rub or gallop. Abdomen: Soft, non-tender,not distended. Bowel sounds normal. No masses Extremities: atraumatic, no cyanosis. No edema. No clubbing Skin: No rashes or lesions. Or bruising Lymph: Cervical, supraclavicular normal. Neurologic: Grossly  non-focal  Lab Results    Latest Ref Rng & Units 12/17/2023    5:33 AM 12/16/2023    3:46 AM 12/15/2023    5:07 AM  CBC  WBC 4.0 - 10.5 K/uL 18.8  15.0  16.9   Hemoglobin 12.0 - 15.0 g/dL 9.2  9.5  9.9   Hematocrit 36.0 - 46.0 % 29.5  30.0  31.7   Platelets 150 - 400 K/uL 613  562  607        Latest Ref Rng & Units 12/16/2023    3:46 AM 12/15/2023    5:07 AM 12/14/2023    5:02 AM  CMP  Glucose 70 - 99 mg/dL 88  90  893   BUN 8 - 23 mg/dL 11  7  7    Creatinine 0.44 - 1.00 mg/dL 9.54  9.61  9.61   Sodium 135 - 145 mmol/L 137  134  134   Potassium 3.5 - 5.1 mmol/L 3.6  3.6  4.1   Chloride 98 - 111 mmol/L 104  100  98   CO2 22 - 32 mmol/L 26  23  25    Calcium  8.9 - 10.3 mg/dL 8.2  8.3  8.7       Microbiology: Blood culture no growth    Assessment/Plan: Endometrial carcinoma with severe vaginal bleeding Awaiting chemo therapy and radiation Underwent speculum examination yesterday  and the tumor was extending into the cervical os. There was bleeding Patient now has a Foley catheter and vaginal packing Fever with leukocytosis the fever is resolved the leukocytes fluctuate The source is likely the uterus.  Necrotic tumor in anaerobic milieu is at risk for infection.  Anaerobes, Clostridium perfringens, streptococci are common organisms But the blood culture has been negative Patient has been receiving ceftriaxone  and Flagyl  Will change to Unasyn  On discharge could go on p.o. Augmentin I discussed with her oncologist and she would like to keep her on suppressive antibiotic therapy as she goes through her chemo Patient will be getting palliative radiation  Thrombocytosis.  Reactive to the bleeding and the anemia  Anemia secondary to vaginal bleed.  Has received PRBC  Discussed the management with the patient and the oncologist.  Discussed with the hospitalist.  ID will sign off Call if needed

## 2023-12-17 NOTE — Progress Notes (Signed)
 PROGRESS NOTE   HPI was taken from Dr. Sim: Valerie Leonard is a 68 y.o. female with medical history significant of endometrial cancer not yet on chemotherapy, depressive disorder, history of alcoholism, who was at the cancer center today to get her access for initiating chemotherapy.  She was noted to have fever and concern for sepsis was raised.  She was subsequently sent over to to the ER for evaluation.  She had recent UTI for which she was treated with cefdinir .  The urine culture was nondiagnostic with multiple species.  Today however patient noted to have white count of 26,000.Lactic acid 2.2.  She had a temperature 102.2.  Also hypotensive on arrival with systolic of 70 and diastolic 48.  Patient appears to have severe sepsis.  The source is not clear but suspicion for possible bacteremia.  Patient is therefore being admitted with sepsis of unknown source.  She is confused.  Appears to have acute metabolic encephalopathy from the incident.     Valerie Leonard  FMW:[id number] DOB: Sep 09, 1955 DOA: 12/10/2023 PCP: Pcp, No    Assessment & Plan:   Principal Problem:   SIRS (systemic inflammatory response syndrome) (HCC) Active Problems:   Chronic alcohol abuse   Severe major depression, single episode, without psychotic features (HCC)   Anemia   Endometrial cancer (HCC)   Endometrial carcinoma (HCC)   Postmenopausal bleeding   Protein-calorie malnutrition, severe  Assessment and Plan: SIRS:  No obvious source   Blood cxs NGTD. Urine cx shows no growth. CXR was neg. RSV, influenza, & COVID19 are all neg. New diarrhea but has had multiple stool softeners. Ceftiraxone/flagyl  converted to unasyn  by ID. CT/abd pelvis had limited evaluation of uterine mass. Per gyn/onc no pyometra. Will likely d/c on a course of oral abx  Diarrhea: new yesterday, has had multiple stool softeners. Denies this as presenting complaint so less likely this is source of possible infection. Improving. Gi pathogen  panel neg   Endometrial cancer with bleeding: stage III. Oncologist is Dr. Melanee. Port placed today. Onco following and recs apprec, gyn onc now also involved. Packing placed in vagina yesterday, cervix is involved. Rad onc involved, maping performed today. Hopefully radiation helps control bleeding. Chemo planned after course of radiation. Foley placed 9/10   ACD: secondary to endometrial bleed. S/p 1 unit of pRBCs transfused so far. S/p IV iron  x 1 as per onco. H&H stable for now despite above bleeding  Hx of alcohol abuse: remote hx. Will continue to monitor     Acute metabolic encephalopathy: resolved  Hypotension: persists. With above bleeding. Relatively asymptomatic.        DVT prophylaxis: SCDs Code Status: full  Family Communication: none at bedside Disposition Plan: snf, toc consulted  Level of care: Telemetry Medical  Status is: Inpatient Remains inpatient appropriate because: severity of illness, requiring IV abxs    Consultants:  ID Onco   Procedures:   Antimicrobials: rocephin , flagyl    Subjective: No pain or fevers. Bleeding a bit less. Hungry.  Objective: Vitals:   12/17/23 1640 12/17/23 1645 12/17/23 1700 12/17/23 1726  BP: (!) 104/54 (!) 98/54 (!) 100/54 (!) 101/54  Pulse:  95 98 99  Resp: 14 16  16   Temp: 99 F (37.2 C)   99.2 F (37.3 C)  TempSrc: Tympanic   Oral  SpO2: 98% 97% 97% 99%  Weight:      Height:        Intake/Output Summary (Last 24 hours) at 12/17/2023 1738 Last data  filed at 12/17/2023 1727 Gross per 24 hour  Intake 0 ml  Output 2150 ml  Net -2150 ml   Filed Weights   12/10/23 1517  Weight: 49 kg    Examination:  General exam: chronically ill appearing Respiratory system: clear breath sounds b/l Cardiovascular system: S1 & S2+. No rubs or clicks   Gastrointestinal system: abd is soft, NT  Central nervous system: alert & awake. Moves all extremities  Psychiatry: Judgement and insight appears normal    Data  Reviewed: I have personally reviewed following labs and imaging studies  CBC: Recent Labs  Lab 12/13/23 0409 12/14/23 0502 12/15/23 0507 12/16/23 0346 12/17/23 0533  WBC 12.6* 13.1* 16.9* 15.0* 18.8*  HGB 10.0* 10.1* 9.9* 9.5* 9.2*  HCT 31.6* 31.9* 31.7* 30.0* 29.5*  MCV 83.6 82.6 83.6 83.1 83.3  PLT 501* 575* 607* 562* 613*   Basic Metabolic Panel: Recent Labs  Lab 12/12/23 0737 12/13/23 0409 12/14/23 0502 12/15/23 0507 12/16/23 0346  NA 135 134* 134* 134* 137  K 3.1* 4.3 4.1 3.6 3.6  CL 104 100 98 100 104  CO2 26 26 25 23 26   GLUCOSE 104* 97 106* 90 88  BUN <5* 8 7* 7* 11  CREATININE 0.47 0.42* 0.38* 0.38* 0.45  CALCIUM  8.0* 8.5* 8.7* 8.3* 8.2*   GFR: Estimated Creatinine Clearance: 51.5 mL/min (by C-G formula based on SCr of 0.45 mg/dL). Liver Function Tests: Recent Labs  Lab 12/11/23 0445  AST 16  ALT 9  ALKPHOS 45  BILITOT 0.5  PROT 5.4*  ALBUMIN 1.7*   No results for input(s): LIPASE, AMYLASE in the last 168 hours. No results for input(s): AMMONIA in the last 168 hours. Coagulation Profile: Recent Labs  Lab 12/11/23 0445  INR 1.4*   Cardiac Enzymes: No results for input(s): CKTOTAL, CKMB, CKMBINDEX, TROPONINI in the last 168 hours. BNP (last 3 results) No results for input(s): PROBNP in the last 8760 hours. HbA1C: No results for input(s): HGBA1C in the last 72 hours. CBG: No results for input(s): GLUCAP in the last 168 hours. Lipid Profile: No results for input(s): CHOL, HDL, LDLCALC, TRIG, CHOLHDL, LDLDIRECT in the last 72 hours. Thyroid  Function Tests: No results for input(s): TSH, T4TOTAL, FREET4, T3FREE, THYROIDAB in the last 72 hours. Anemia Panel: No results for input(s): VITAMINB12, FOLATE, FERRITIN, TIBC, IRON , RETICCTPCT in the last 72 hours. Sepsis Labs: Recent Labs  Lab 12/10/23 2040 12/15/23 0507  PROCALCITON  --  1.85  LATICACIDVEN 1.3  --     Recent Results (from the  past 240 hours)  Blood culture (routine x 2)     Status: None (Preliminary result)   Collection Time: 12/10/23  3:20 PM   Specimen: BLOOD  Result Value Ref Range Status   Specimen Description BLOOD RIGHT ANTECUBITAL  Final   Special Requests   Final    BOTTLES DRAWN AEROBIC AND ANAEROBIC Blood Culture adequate volume   Culture   Final    NO GROWTH 4 DAYS Performed at [facility], [address] [place], [place] [address]    Report Status PENDING  Incomplete  Resp panel by RT-PCR (RSV, Flu A&B, Covid) Anterior Nasal Swab     Status: None   Collection Time: 12/10/23  4:25 PM   Specimen: Anterior Nasal Swab  Result Value Ref Range Status   SARS Coronavirus 2 by RT PCR NEGATIVE NEGATIVE Final    Comment: (NOTE) SARS-CoV-2 target nucleic acids are NOT DETECTED.  The SARS-CoV-2 RNA is generally detectable in  upper respiratory specimens during the acute phase of infection. The lowest concentration of SARS-CoV-2 viral copies this assay can detect is 138 copies/mL. A negative result does not preclude SARS-Cov-2 infection and should not be used as the sole basis for treatment or other patient management decisions. A negative result may occur with  improper specimen collection/handling, submission of specimen other than nasopharyngeal swab, presence of viral mutation(s) within the areas targeted by this assay, and inadequate number of viral copies(<138 copies/mL). A negative result must be combined with clinical observations, patient history, and epidemiological information. The expected result is Negative.  Fact Sheet for Patients:  BloggerCourse.com  Fact Sheet for Healthcare Providers:  SeriousBroker.it  This test is no t yet approved or cleared by the [place]  FDA and  has been authorized for detection and/or diagnosis of SARS-CoV-2 by FDA under an Emergency Use Authorization (EUA). This EUA will remain  in effect  (meaning this test can be used) for the duration of the COVID-19 declaration under Section 564(b)(1) of the Act, 21 U.S.C.section 360bbb-3(b)(1), unless the authorization is terminated  or revoked sooner.       Influenza A by PCR NEGATIVE NEGATIVE Final   Influenza B by PCR NEGATIVE NEGATIVE Final    Comment: (NOTE) The Xpert Xpress SARS-CoV-2/FLU/RSV plus assay is intended as an aid in the diagnosis of influenza from Nasopharyngeal swab specimens and should not be used as a sole basis for treatment. Nasal washings and aspirates are unacceptable for Xpert Xpress SARS-CoV-2/FLU/RSV testing.  Fact Sheet for Patients: BloggerCourse.com  Fact Sheet for Healthcare Providers: SeriousBroker.it  This test is not yet approved or cleared by the [place]  FDA and has been authorized for detection and/or diagnosis of SARS-CoV-2 by FDA under an Emergency Use Authorization (EUA). This EUA will remain in effect (meaning this test can be used) for the duration of the COVID-19 declaration under Section 564(b)(1) of the Act, 21 U.S.C. section 360bbb-3(b)(1), unless the authorization is terminated or revoked.     Resp Syncytial Virus by PCR NEGATIVE NEGATIVE Final    Comment: (NOTE) Fact Sheet for Patients: BloggerCourse.com  Fact Sheet for Healthcare Providers: SeriousBroker.it  This test is not yet approved or cleared by the [place]  FDA and has been authorized for detection and/or diagnosis of SARS-CoV-2 by FDA under an Emergency Use Authorization (EUA). This EUA will remain in effect (meaning this test can be used) for the duration of the COVID-19 declaration under Section 564(b)(1) of the Act, 21 U.S.C. section 360bbb-3(b)(1), unless the authorization is terminated or revoked.  Performed at [facility], [address], [place], [place] [address]   Urine Culture      Status: None   Collection Time: 12/10/23  4:25 PM   Specimen: Urine, Clean Catch  Result Value Ref Range Status   Specimen Description   Final    URINE, CLEAN CATCH Performed at [facility], [address] [place], [place] [address]    Special Requests   Final    NONE Performed at [facility], [address], [place], [place] [address]    Culture   Final    NO GROWTH Performed at [facility], [address], [place], [place] [address]    Report Status 12/11/2023 FINAL  Final  Blood culture (routine x 2)     Status: None (Preliminary result)   Collection Time: 12/10/23  8:41 PM   Specimen: BLOOD  Result Value Ref Range Status   Specimen Description BLOOD  BLOOD LEFT HAND [facility]  Final   Special Requests   Final    BOTTLES DRAWN AEROBIC AND ANAEROBIC Blood Culture adequate volume   Culture   Final    NO GROWTH 4 DAYS Performed at [facility], [address] [place], [place] [address]    Report Status PENDING  Incomplete  MRSA Next Gen by PCR, Nasal     Status: None   Collection Time: 12/12/23 10:28 AM   Specimen: Nasal Mucosa; Nasal Swab  Result Value Ref Range Status   MRSA by PCR Next Gen NOT DETECTED NOT DETECTED Final    Comment: (NOTE) The GeneXpert MRSA Assay (FDA approved for NASAL specimens only), is one component of a comprehensive MRSA colonization surveillance program. It is not intended to diagnose MRSA infection nor to guide or monitor treatment for MRSA infections. Test performance is not FDA approved in patients less than 63 years old. Performed at [facility], [address] [place], [place] [address]   Gastrointestinal Panel by PCR , Stool     Status: None   Collection Time: 12/16/23  9:36 AM   Specimen: Stool  Result Value Ref Range Status   Campylobacter species NOT DETECTED NOT DETECTED Final   Plesimonas shigelloides NOT DETECTED NOT DETECTED Final   Salmonella species NOT DETECTED NOT DETECTED Final    Yersinia enterocolitica NOT DETECTED NOT DETECTED Final   Vibrio species NOT DETECTED NOT DETECTED Final   Vibrio cholerae NOT DETECTED NOT DETECTED Final   Enteroaggregative E coli (EAEC) NOT DETECTED NOT DETECTED Final   Enteropathogenic E coli (EPEC) NOT DETECTED NOT DETECTED Final   Enterotoxigenic E coli (ETEC) NOT DETECTED NOT DETECTED Final   Shiga like toxin producing E coli (STEC) NOT DETECTED NOT DETECTED Final   Shigella/Enteroinvasive E coli (EIEC) NOT DETECTED NOT DETECTED Final   Cryptosporidium NOT DETECTED NOT DETECTED Final   Cyclospora cayetanensis NOT DETECTED NOT DETECTED Final   Entamoeba histolytica NOT DETECTED NOT DETECTED Final   Giardia lamblia NOT DETECTED NOT DETECTED Final   Adenovirus F40/41 NOT DETECTED NOT DETECTED Final   Astrovirus NOT DETECTED NOT DETECTED Final   Norovirus GI/GII NOT DETECTED NOT DETECTED Final   Rotavirus A NOT DETECTED NOT DETECTED Final   Sapovirus (I, II, IV, and V) NOT DETECTED NOT DETECTED Final    Comment: Performed at [facility], [address] [place], [place] [address]         Radiology Studies: IR IMAGING GUIDED PORT INSERTION Result Date: 12/17/2023 INDICATION: Port-A-Cath needed for treatment of endometrial cancer. EXAM: FLUOROSCOPIC AND ULTRASOUND GUIDED PLACEMENT OF A SUBCUTANEOUS PORT MEDICATIONS: Moderate sedation ANESTHESIA/SEDATION: Moderate (conscious) sedation was employed during this procedure. A total of Versed  2 mg and fentanyl  100 mcg was administered intravenously at the order of the provider performing the procedure. Total intra-service moderate sedation time: 24 minutes. Patient's level of consciousness and vital signs were monitored continuously by radiology nurse throughout the procedure under the supervision of the provider performing the procedure. FLUOROSCOPY TIME:  Radiation Exposure Index (as provided by the fluoroscopic device): 1 mGy Kerma COMPLICATIONS: None immediate. PROCEDURE: The  procedure, risks, benefits, and alternatives were explained to the patient. Questions regarding the procedure were encouraged and answered. The patient understands and consents to the procedure. Patient was placed supine on the interventional table. Ultrasound confirmed a patent right internal jugular vein. Ultrasound image was saved for documentation. The right chest and neck were cleaned with a skin antiseptic and a sterile drape  was placed. Maximal barrier sterile technique was utilized including caps, mask, sterile gowns, sterile gloves, sterile drape, hand hygiene and skin antiseptic. The right neck was anesthetized with 1% lidocaine . Small incision was made in the right neck with a blade. Micropuncture set was placed in the right internal jugular vein with ultrasound guidance. The micropuncture wire was used for measurement purposes. The right chest was anesthetized with 1% lidocaine  with epinephrine . #15 blade was used to make an incision and a subcutaneous port pocket was formed. 8 french Power Port was assembled. Subcutaneous tunnel was formed with a stiff tunneling device. The port catheter was brought through the subcutaneous tunnel. The port was placed in the subcutaneous pocket. The micropuncture set was exchanged for a peel-away sheath. The catheter was placed through the peel-away sheath and the tip was positioned at the superior cavoatrial junction. Catheter placement was confirmed with fluoroscopy. The port was accessed and flushed with heparinized saline. The port pocket was closed using two layers of absorbable sutures and Dermabond. The vein skin site was closed using a single layer of absorbable suture and Dermabond. Sterile dressings were applied. Patient tolerated the procedure well without an immediate complication. Ultrasound and fluoroscopic images were taken and saved for this procedure. IMPRESSION: Placement of a subcutaneous power-injectable port device. Catheter tip at the superior  cavoatrial junction. Electronically Signed   By: Juliene Balder M.D.   On: 12/17/2023 16:47         Scheduled Meds:  Chlorhexidine  Gluconate Cloth  6 each Topical Daily   diphenhydrAMINE   50 mg Oral QHS   escitalopram   5 mg Oral Daily   feeding supplement  237 mL Oral TID BM   folic acid   1 mg Oral Daily   multivitamin with minerals  1 tablet Oral Daily   norethindrone   5 mg Oral TID   thiamine   100 mg Per Tube Daily   Continuous Infusions:  ampicillin -sulbactam (UNASYN ) IV 3 g (12/17/23 1729)     LOS: 7 days      Devaughn KATHEE Ban, MD [facility]   If 7PM-7AM, please contact night-coverage [email or web address] 12/17/2023, 5:38 PM

## 2023-12-17 NOTE — Procedures (Signed)
 Interventional Radiology Procedure:   Indications: Endometrioid endometrial carcinoma   Procedure: Port placement  Findings: Right jugular port, tip at SVC/RA junction.    Complications: None     EBL: Minimal, less than 10 ml  Plan: Keep port site and incisions dry for at least 24 hours.     Halston Fairclough R. Philip, MD  Pager: [phone]

## 2023-12-17 NOTE — Consult Note (Signed)
 NEW PATIENT EVALUATION  Name: Valerie Leonard  MRN: [id number]  Date:   12/10/2023     DOB: [birth year]   This 68 y.o. 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  REFERRING PHYSICIAN: No ref. provider found  CHIEF COMPLAINT: No chief complaint on file.   DIAGNOSIS: The primary encounter diagnosis was Severe sepsis (HCC). Diagnoses of Vaginal bleeding and Endometrial cancer (HCC) were also pertinent to this visit.   PREVIOUS INVESTIGATIONS:  CT scans reviewed Pathology reports reviewed Clinical notes reviewed  HPI: Patient is a 68 year old female seen in her hospital bed.  She was seen yesterday in GYN clinic by Dr. Elby.  Patient has known stage IIIc endometrioid endometrial carcinoma and was scheduled to start CarboTaxol chemotherapy this week with medical oncology.  She went for port placement was noted to be febrile.  She had a leukocytosis with a white blood cell count of 23 hypotensive and febrile to 102.  She received IV antibiotics including cefepime  and Flagyl .  CT scan was limited be secondary to lack of IV contrast showing a persistent mass in the uterus with masslike expansion in the lower uterine segment with heterogeneous fluid in the right uterine fundus.  Based on her bleeding yesterday time of examination I was requested to evaluate the patient for emergent radiation therapy to assist with addressing her vaginal bleeding.  Seen today in her hospital bedside she is doing fairly well she states she is having some slight cramping in her abdomen.  She otherwise is without complaint.  PLANNED TREATMENT REGIMEN: Palliative radiation therapy to uterus and upper vagina  PAST MEDICAL HISTORY:  has a past medical history of Acute delirium, Acute metabolic encephalopathy, Anemia, Chronic alcohol abuse, Depression, Endometrial cancer (HCC), Hypokalemia, Hypomagnesemia, Pressure injury of  skin, Severe major depression, single episode, without psychotic features (HCC), and Vaginal bleeding.    PAST SURGICAL HISTORY:  Past Surgical History:  Procedure Laterality Date   IR PATIENT EVAL TECH 0-60 MINS  12/10/2023   NO PAST SURGERIES      FAMILY HISTORY: family history is not on file.  SOCIAL HISTORY:  reports that she has quit smoking. Her smoking use included cigarettes and cigars. She has never used smokeless tobacco. She reports current alcohol use of about 3.0 standard drinks of alcohol per week. She reports that she does not currently use drugs.  ALLERGIES: Patient has no known allergies.  MEDICATIONS:  Current Facility-Administered Medications  Medication Dose Route Frequency Provider Last Rate Last Admin   acetaminophen  (TYLENOL ) tablet 650 mg  650 mg Oral Q6H PRN Garba, Mohammad L, MD   650 mg at 12/16/23 1501   Or   acetaminophen  (TYLENOL ) suppository 650 mg  650 mg Rectal Q6H PRN Garba, Mohammad L, MD   650 mg at 12/17/23 0751   ALPRAZolam  (XANAX ) tablet 0.25 mg  0.25 mg Oral BID PRN Trudy Anthony HERO, MD   0.25 mg at 12/17/23 0024   cefTRIAXone  (ROCEPHIN ) 2 g in sodium chloride  0.9 % 100 mL IVPB  2 g Intravenous Q24H Trudy Anthony HERO, MD 200 mL/hr at 12/16/23 2141 2 g at 12/16/23 2141   Chlorhexidine  Gluconate Cloth 2 % PADS 6 each  6 each Topical Daily Dasie Tinnie MATSU, NP   6 each at 12/16/23 1758   diphenhydrAMINE  (BENADRYL ) capsule 50 mg  50 mg Oral QHS Trudy Anthony HERO, MD   50 mg at 12/16/23 2135   escitalopram  (  LEXAPRO ) tablet 5 mg  5 mg Oral Daily Trudy Anthony HERO, MD   5 mg at 12/17/23 0931   feeding supplement (ENSURE PLUS HIGH PROTEIN) liquid 237 mL  237 mL Oral TID BM Kandis Devaughn Sayres, MD   237 mL at 12/16/23 1942   folic acid  (FOLVITE ) tablet 1 mg  1 mg Oral Daily Kandis Devaughn Sayres, MD   1 mg at 12/17/23 0932   metroNIDAZOLE  (FLAGYL ) IVPB 500 mg  500 mg Intravenous Q12H Sim Re L, MD 100 mL/hr at 12/17/23 0942 500 mg at 12/17/23 0942    multivitamin with minerals tablet 1 tablet  1 tablet Oral Daily Kandis Devaughn Sayres, MD   1 tablet at 12/17/23 9068   norethindrone  (AYGESTIN ) tablet 5 mg  5 mg Oral TID Kandis Devaughn Sayres, MD   5 mg at 12/17/23 0932   ondansetron  (ZOFRAN ) tablet 4 mg  4 mg Oral Q6H PRN Sim Re CROME, MD       Or   ondansetron  (ZOFRAN ) injection 4 mg  4 mg Intravenous Q6H PRN Sim Re CROME, MD   4 mg at 12/15/23 0524   thiamine  (VITAMIN B1) tablet 100 mg  100 mg Per Tube Daily Kandis Devaughn Sayres, MD   100 mg at 12/17/23 0931    ECOG PERFORMANCE STATUS:  2 - Symptomatic, <50% confined to bed  REVIEW OF SYSTEMS: Patient has a history of acute delirium with acute metabolic encephalopathy anemia chronic alcohol abuse depression hypokalemia hypomagnesia him Patient denies any weight loss, fatigue, weakness, fever, chills or night sweats. Patient denies any loss of vision, blurred vision. Patient denies any ringing  of the ears or hearing loss. No irregular heartbeat. Patient denies heart murmur or history of fainting. Patient denies any chest pain or pain radiating to her upper extremities. Patient denies any shortness of breath, difficulty breathing at night, cough or hemoptysis. Patient denies any swelling in the lower legs. Patient denies any nausea vomiting, vomiting of blood, or coffee ground material in the vomitus. Patient denies any stomach pain. Patient states has had normal bowel movements no significant constipation or diarrhea. Patient denies any dysuria, hematuria or significant nocturia. Patient denies any problems walking, swelling in the joints or loss of balance. Patient denies any skin changes, loss of hair or loss of weight. Patient denies any excessive worrying or anxiety or significant depression. Patient denies any problems with insomnia. Patient denies excessive thirst, polyuria, polydipsia. Patient denies any swollen glands, patient denies easy bruising or easy bleeding. Patient denies any  recent infections, allergies or URI. Patient s visual fields have not changed significantly in recent time.   PHYSICAL EXAM: BP 105/61 (BP Location: Right Arm)   Pulse 87   Temp 98.9 F (37.2 C) (Oral)   Resp 18   Ht 5' 1 (1.549 m)   Wt 108 lb (49 kg)   SpO2 98%   BMI 20.41 kg/m  Frail-appearing somewhat cachectic female in NAD.  Abdomen is benign well-developed well-nourished patient in NAD. HEENT reveals PERLA, EOMI, discs not visualized.  Oral cavity is clear. No oral mucosal lesions are identified. Neck is clear without evidence of cervical or supraclavicular adenopathy. Lungs are clear to A&P. Cardiac examination is essentially unremarkable with regular rate and rhythm without murmur rub or thrill. Abdomen is benign with no organomegaly or masses noted. Motor sensory and DTR levels are equal and symmetric in the upper and lower extremities. Cranial nerves II through XII are grossly intact. Proprioception is intact. No peripheral  adenopathy or edema is identified. No motor or sensory levels are noted. Crude visual fields are within normal range.  LABORATORY DATA: Pathology reports reviewed    RADIOLOGY RESULTS: CT scan reviewed compatible with above-stated findings   IMPRESSION: At least stage III endometrial carcinoma with persistent vaginal bleeding in 68 year old female  PLAN: At this time like to start palliative radiation therapy to her uterus and upper vagina.  Will plan to deliver 30 Gray in 10 fractions.  Will simulate the patient today and start her under treatment tomorrow.  Risks and benefits of treatment including possible increased lower urinary tract symptoms diarrhea fatigue alteration blood counts all were discussed in detail with the patient.  Patient consents to treatment.  I would like to take this opportunity to thank you for allowing me to participate in the care of your patient.SABRA Marcey Penton, MD

## 2023-12-17 NOTE — Progress Notes (Signed)
 Gynecologic Oncology Inpatient Note   Chief Complaint: sepsis, bleeding due to gynecologic malignancy  Subjective:  Valerie Leonard is a 68 y.o. female currently admitted to hospital for sepsis.  Patient was evaluated yesterday for possible pyometra as source of infection.  She experienced profuse bleeding after pelvic exam and required vaginal packing to control bleeding due to malignancy.  Foley catheter was placed.  Today she is in good spirits.  Tolerating the packing and Foley well.  Denies pain today.  Is scheduled for simulation with rad onc later today and port placement.   Problem List: Patient Active Problem List   Diagnosis Date Noted   SIRS (systemic inflammatory response syndrome) (HCC) 12/16/2023   Postmenopausal bleeding 12/16/2023   Protein-calorie malnutrition, severe 12/16/2023   Endometrial carcinoma (HCC) 12/14/2023   HFrEF (heart failure with reduced ejection fraction) (HCC) 12/03/2023   Endometrial cancer (HCC) 11/11/2023   Anemia 11/04/2023   ABLA (acute blood loss anemia) 09/28/2023   Pressure injury of skin 09/28/2023   Vaginal bleeding 09/27/2023   Severe major depression, single episode, without psychotic features (HCC) 07/26/2020   Depression    Acute delirium 07/25/2020   Acute metabolic encephalopathy    Hypomagnesemia    Chronic alcohol abuse    Hypokalemia 07/24/2020    Past Medical History: Past Medical History:  Diagnosis Date   Acute delirium    Acute metabolic encephalopathy    Anemia    blood loss anemia   Chronic alcohol abuse    Depression    Endometrial cancer (HCC)    Hypokalemia    Hypomagnesemia    Pressure injury of skin    Severe major depression, single episode, without psychotic features (HCC)    Vaginal bleeding     Past Surgical History: Past Surgical History:  Procedure Laterality Date   IR PATIENT EVAL TECH 0-60 MINS  12/10/2023   NO PAST SURGERIES      Past Gynecologic History:  See prior note  OB History:   OB History  No obstetric history on file.    Family History: History reviewed. No pertinent family history.  Social History: Social History   Socioeconomic History   Marital status: Single    Spouse name: Not on file   Number of children: 0   Years of education: Not on file   Highest education level: Not on file  Occupational History   Not on file  Tobacco Use   Smoking status: Former    Types: Cigarettes, Cigars   Smokeless tobacco: Never  Vaping Use   Vaping status: Never Used  Substance and Sexual Activity   Alcohol use: Yes    Alcohol/week: 3.0 standard drinks of alcohol    Types: 3 Cans of beer per week    Comment: More than 4 glasses of wine, mixed drinks, beer daily.    Drug use: Not Currently    Comment: former use of marijuana   Sexual activity: Not Currently    Birth control/protection: None  Other Topics Concern   Not on file  Social History Narrative   Lives by herself    Social Drivers of Health   Financial Resource Strain: Low Risk  (10/13/2023)   Received from [facility]   Overall Financial Resource Strain (CARDIA)    Difficulty of Paying Living Expenses: Not very hard  Food Insecurity: No Food Insecurity (12/10/2023)   Hunger Vital Sign    Worried About Running Out of Food in the Last Year: Never true  Ran Out of Food in the Last Year: Never true  Recent Concern: Food Insecurity - Food Insecurity Present (09/27/2023)   Hunger Vital Sign    Worried About Running Out of Food in the Last Year: Sometimes true    Ran Out of Food in the Last Year: Sometimes true  Transportation Needs: No Transportation Needs (12/10/2023)   PRAPARE - Administrator, Civil Service (Medical): No    Lack of Transportation (Non-Medical): No  Recent Concern: Transportation Needs - Unmet Transportation Needs (09/27/2023)   PRAPARE - Administrator, Civil Service (Medical): Yes    Lack of Transportation (Non-Medical): Yes  Physical  Activity: Not on file  Stress: Not on file  Social Connections: Unknown (12/11/2023)   Social Connection and Isolation Panel    Frequency of Communication with Friends and Family: More than three times a week    Frequency of Social Gatherings with Friends and Family: More than three times a week    Attends Religious Services: Patient declined    Database administrator or Organizations: Patient declined    Attends Banker Meetings: Patient declined    Marital Status: Widowed  Recent Concern: Social Connections - Socially Isolated (09/28/2023)   Social Connection and Isolation Panel    Frequency of Communication with Friends and Family: Never    Frequency of Social Gatherings with Friends and Family: Never    Attends Religious Services: Never    Database administrator or Organizations: No    Attends Banker Meetings: Never    Marital Status: Widowed  Intimate Partner Violence: Not At Risk (12/10/2023)   Humiliation, Afraid, Rape, and Kick questionnaire    Fear of Current or Ex-Partner: No    Emotionally Abused: No    Physically Abused: No    Sexually Abused: No    Allergies: No Known Allergies  Current Medications: Current Facility-Administered Medications  Medication Dose Route Frequency Provider Last Rate Last Admin   acetaminophen  (TYLENOL ) tablet 650 mg  650 mg Oral Q6H PRN Sim Re L, MD   650 mg at 12/16/23 1501   Or   acetaminophen  (TYLENOL ) suppository 650 mg  650 mg Rectal Q6H PRN Garba, Mohammad L, MD   650 mg at 12/17/23 0751   ALPRAZolam  (XANAX ) tablet 0.25 mg  0.25 mg Oral BID PRN Trudy Anthony HERO, MD   0.25 mg at 12/17/23 0024   cefTRIAXone  (ROCEPHIN ) 2 g in sodium chloride  0.9 % 100 mL IVPB  2 g Intravenous Q24H Trudy Anthony HERO, MD 200 mL/hr at 12/16/23 2141 2 g at 12/16/23 2141   Chlorhexidine  Gluconate Cloth 2 % PADS 6 each  6 each Topical Daily Dasie Tinnie MATSU, NP   6 each at 12/17/23 9041   diphenhydrAMINE  (BENADRYL ) capsule 50  mg  50 mg Oral QHS Trudy Anthony HERO, MD   50 mg at 12/16/23 2135   escitalopram  (LEXAPRO ) tablet 5 mg  5 mg Oral Daily Trudy Anthony HERO, MD   5 mg at 12/17/23 0931   feeding supplement (ENSURE PLUS HIGH PROTEIN) liquid 237 mL  237 mL Oral TID BM Kandis Devaughn Sayres, MD   237 mL at 12/16/23 1942   folic acid  (FOLVITE ) tablet 1 mg  1 mg Oral Daily Wouk, Devaughn Sayres, MD   1 mg at 12/17/23 0932   multivitamin with minerals tablet 1 tablet  1 tablet Oral Daily Kandis Devaughn Sayres, MD   1 tablet at 12/17/23 [phone]  norethindrone  (AYGESTIN ) tablet 5 mg  5 mg Oral TID Kandis Devaughn Sayres, MD   5 mg at 12/17/23 0932   ondansetron  (ZOFRAN ) tablet 4 mg  4 mg Oral Q6H PRN Sim Emery CROME, MD       Or   ondansetron  (ZOFRAN ) injection 4 mg  4 mg Intravenous Q6H PRN Sim Emery CROME, MD   4 mg at 12/15/23 9475   thiamine  (VITAMIN B1) tablet 100 mg  100 mg Per Tube Daily Wouk, Devaughn Sayres, MD   100 mg at 12/17/23 9068    Review of Systems General:  no complaints Skin: no complaints Eyes: no complaints HEENT: no complaints Breasts: no complaints Pulmonary: no complaints Cardiac: no complaints Gastrointestinal: no complaints Genitourinary/Sexual: no complaints Ob/Gyn: no complaints Musculoskeletal: no complaints Hematology: no complaints Neurologic/Psych: no complaints   Objective:  Physical Examination:  BP (!) 96/57   Pulse 94   Temp 99.7 F (37.6 C) (Temporal)   Resp 20   Ht 5' 1 (1.549 m)   Wt 108 lb (49 kg)   SpO2 97%   BMI 20.41 kg/m     General appearance: alert, cooperative, and appears stated age Cardiovascular: without murmurs, rubs or gallops Respiratory: clear to auscultation Abdomen: no palpable masses soft nontender, nondistended bowel sounds present without hepatosplenomegaly Skin exam - normal coloration and turgor, no rashes Neurological exam reveals alert, oriented, normal speech, no focal findings or movement disorder noted. Pelvic: Packing in place. Scant  spotting on pad. Foley secured.      Assessment:  KINLEE GARRISON is a 68 y.o. 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  Pulmonary nodules are not definitive.  She now has obvious cervical involvement. Biopsy pending.  She was symptomatic with heavy bleeding after exam yesterday and required vaginal packing to control bleeding and hemorrhage.  She is seeing radiation oncology for palliation of her bleeding.  Hmg 9.2. a  Plan:   Plan is for her to receive palliative radiation therapy to her uterus and upper vagina to control her bleeding starting tomorrow.  Plan is for 30 Gray in 10 fractions.  I will plan to remove the vaginal packing and Foley 24 hours after she has started radiation.  If she rebleeds, may need to replace packing and Foley to provide tamponade against the bleeding tumor site.  Nursing will continue to monitor pad counts but currently her bleeding appears well-controlled with packing.  She will continue broad-spectrum antibiotics for now.  Recommend continuing to monitor her counts.  She will benefit from IV iron  outpatient which will be managed by medical oncology.    Will continue to follow.  Tinnie KANDICE Dawn, NP

## 2023-12-17 NOTE — Progress Notes (Signed)
 Patient clinically stable post IR Port placement per DR Philip, tolerated well. Vitals stable pre and post procedure. Denies complaints post procedure. Eating sandwich post procedure. Received Versed  2 mg along with Fentanyl  100 mcg IV for procedure. Report given to Maddy Rn post procedure/1C with questions answered.

## 2023-12-17 NOTE — Progress Notes (Signed)
 PT Cancellation Note  Patient Details Name: Valerie Leonard MRN: [id number] DOB: March 30, 1956   Cancelled Treatment:    Reason Eval/Treat Not Completed: Other (comment) (Pt getting picked up for port placement). Treatment attempted. Pt agreeable to PT session, however LPN entered room to pick pt up for port placement. Will re-attempt tomorrow.  Cirilo Canner, SPT  Dang Mathison 12/17/2023, 2:40 PM

## 2023-12-17 NOTE — Plan of Care (Signed)
  Problem: Pain Managment: Goal: General experience of comfort will improve and/or be controlled Outcome: Progressing   Problem: Safety: Goal: Ability to remain free from injury will improve Outcome: Progressing

## 2023-12-18 ENCOUNTER — Inpatient Hospital Stay

## 2023-12-18 ENCOUNTER — Inpatient Hospital Stay: Admitting: Oncology

## 2023-12-18 ENCOUNTER — Other Ambulatory Visit: Payer: Self-pay

## 2023-12-18 ENCOUNTER — Ambulatory Visit

## 2023-12-18 DIAGNOSIS — R6510 Systemic inflammatory response syndrome (SIRS) of non-infectious origin without acute organ dysfunction: Secondary | ICD-10-CM | POA: Diagnosis not present

## 2023-12-18 LAB — CBC
HCT: 29.7 % — ABNORMAL LOW (ref 36.0–46.0)
Hemoglobin: 9.3 g/dL — ABNORMAL LOW (ref 12.0–15.0)
MCH: 26.1 pg (ref 26.0–34.0)
MCHC: 31.3 g/dL (ref 30.0–36.0)
MCV: 83.2 fL (ref 80.0–100.0)
Platelets: 633 K/uL — ABNORMAL HIGH (ref 150–400)
RBC: 3.57 MIL/uL — ABNORMAL LOW (ref 3.87–5.11)
RDW: 18 % — ABNORMAL HIGH (ref 11.5–15.5)
WBC: 21.5 K/uL — ABNORMAL HIGH (ref 4.0–10.5)
nRBC: 0 % (ref 0.0–0.2)

## 2023-12-18 LAB — RAD ONC ARIA SESSION SUMMARY
Course Elapsed Days: 0
Plan Fractions Treated to Date: 1
Plan Prescribed Dose Per Fraction: 3 Gy
Plan Total Fractions Prescribed: 10
Plan Total Prescribed Dose: 30 Gy
Reference Point Dosage Given to Date: 3 Gy
Reference Point Session Dosage Given: 3 Gy
Session Number: 1

## 2023-12-18 LAB — CULTURE, BLOOD (ROUTINE X 2)
Culture: NO GROWTH
Culture: NO GROWTH
Special Requests: ADEQUATE
Special Requests: ADEQUATE

## 2023-12-18 MED ORDER — THIAMINE MONONITRATE 100 MG PO TABS
100.0000 mg | ORAL_TABLET | Freq: Every day | ORAL | Status: AC
Start: 2023-12-19 — End: 2023-12-22
  Administered 2023-12-19 – 2023-12-22 (×4): 100 mg via ORAL
  Filled 2023-12-18 (×4): qty 1

## 2023-12-18 NOTE — Progress Notes (Signed)
 PROGRESS NOTE   HPI was taken from Dr. Sim: Valerie Leonard is a 68 y.o. female with medical history significant of endometrial cancer not yet on chemotherapy, depressive disorder, history of alcoholism, who was at the cancer center today to get her access for initiating chemotherapy.  She was noted to have fever and concern for sepsis was raised.  She was subsequently sent over to to the ER for evaluation.  She had recent UTI for which she was treated with cefdinir .  The urine culture was nondiagnostic with multiple species.  Today however patient noted to have white count of 26,000.Lactic acid 2.2.  She had a temperature 102.2.  Also hypotensive on arrival with systolic of 70 and diastolic 48.  Patient appears to have severe sepsis.  The source is not clear but suspicion for possible bacteremia.  Patient is therefore being admitted with sepsis of unknown source.  She is confused.  Appears to have acute metabolic encephalopathy from the incident.     ORALEE RAPAPORT  FMW:[id number] DOB: Jan 09, 1956 DOA: 12/10/2023 PCP: Pcp, No    Assessment & Plan:   Principal Problem:   SIRS (systemic inflammatory response syndrome) (HCC) Active Problems:   Chronic alcohol abuse   Severe major depression, single episode, without psychotic features (HCC)   Anemia   Endometrial cancer (HCC)   Endometrial carcinoma (HCC)   Postmenopausal bleeding   Protein-calorie malnutrition, severe   Fever  Assessment and Plan: SIRS:  No obvious source   Blood cxs NGTD. Urine cx shows no growth. CXR was neg. RSV, influenza, & COVID19 are all neg. New diarrhea but has had multiple stool softeners. Ceftiraxone/flagyl  converted to unasyn  by ID. CT/abd pelvis had limited evaluation of uterine mass. Per gyn/onc no pyometra. ID thinks likely translocation of bacteria caused by malignancy. Will plan on augmentin at d/c.   Diarrhea: likely 2/2 stool softeners. Improved. Gi pathogen panel neg   Endometrial cancer with bleeding:  stage III. Oncologist is Dr. Melanee. Port placed 9/11. Onco following and recs apprec, gyn onc now also involved. Packing placed in vagina 9/10, cervix is involved. Rad onc involved, first radiation today. Hopefully radiation helps control bleeding. Chemo planned after course of radiation. Foley placed 9/10. Gyn onc planning to remove packing and foley tomorrow  ACD: secondary to endometrial bleed. S/p 1 unit of pRBCs transfused so far. S/p IV iron  x 1 as per onco. H&H stable for now despite above bleeding  Hx of alcohol abuse: remote hx. Will continue to monitor     Acute metabolic encephalopathy: resolved  Hypotension: persists. With above bleeding. Relatively asymptomatic.        DVT prophylaxis: SCDs Code Status: full  Family Communication: none at bedside Disposition Plan: snf, toc consulted  Level of care: Telemetry Medical  Status is: Inpatient Remains inpatient appropriate because: severity of illness, requiring IV abxs, monitoring of bleeding, radiation therapy    Consultants:  ID Onco   Procedures:   Antimicrobials: rocephin , flagyl  > unasyn    Subjective: No pain or fevers. Bleeding less. Good spirits  Objective: Vitals:   12/17/23 1912 12/17/23 2017 12/18/23 0358 12/18/23 0810  BP: (!) 92/53 104/68 96/64 99/62   Pulse: 92 97 89 85  Resp: 18 16 16 16   Temp: 98.5 F (36.9 C) 98 F (36.7 C) 98 F (36.7 C) 98.4 F (36.9 C)  TempSrc: Oral   Oral  SpO2: 99%  99% 98%  Weight:      Height:  Intake/Output Summary (Last 24 hours) at 12/18/2023 1440 Last data filed at 12/18/2023 1044 Gross per 24 hour  Intake 100 ml  Output 1600 ml  Net -1500 ml   Filed Weights   12/10/23 1517  Weight: 49 kg    Examination:  General exam: chronically ill appearing Respiratory system: clear breath sounds b/l Cardiovascular system: S1 & S2+. No rubs or clicks   Gastrointestinal system: abd is soft, NT  Central nervous system: alert & awake. Moves all extremities   Psychiatry: Judgement and insight appears normal    Data Reviewed: I have personally reviewed following labs and imaging studies  CBC: Recent Labs  Lab 12/14/23 0502 12/15/23 0507 12/16/23 0346 12/17/23 0533 12/18/23 0637  WBC 13.1* 16.9* 15.0* 18.8* 21.5*  HGB 10.1* 9.9* 9.5* 9.2* 9.3*  HCT 31.9* 31.7* 30.0* 29.5* 29.7*  MCV 82.6 83.6 83.1 83.3 83.2  PLT 575* 607* 562* 613* 633*   Basic Metabolic Panel: Recent Labs  Lab 12/12/23 0737 12/13/23 0409 12/14/23 0502 12/15/23 0507 12/16/23 0346  NA 135 134* 134* 134* 137  K 3.1* 4.3 4.1 3.6 3.6  CL 104 100 98 100 104  CO2 26 26 25 23 26   GLUCOSE 104* 97 106* 90 88  BUN <5* 8 7* 7* 11  CREATININE 0.47 0.42* 0.38* 0.38* 0.45  CALCIUM  8.0* 8.5* 8.7* 8.3* 8.2*   GFR: Estimated Creatinine Clearance: 51.5 mL/min (by C-G formula based on SCr of 0.45 mg/dL). Liver Function Tests: No results for input(s): AST, ALT, ALKPHOS, BILITOT, PROT, ALBUMIN in the last 168 hours.  No results for input(s): LIPASE, AMYLASE in the last 168 hours. No results for input(s): AMMONIA in the last 168 hours. Coagulation Profile: No results for input(s): INR, PROTIME in the last 168 hours.  Cardiac Enzymes: No results for input(s): CKTOTAL, CKMB, CKMBINDEX, TROPONINI in the last 168 hours. BNP (last 3 results) No results for input(s): PROBNP in the last 8760 hours. HbA1C: No results for input(s): HGBA1C in the last 72 hours. CBG: No results for input(s): GLUCAP in the last 168 hours. Lipid Profile: No results for input(s): CHOL, HDL, LDLCALC, TRIG, CHOLHDL, LDLDIRECT in the last 72 hours. Thyroid  Function Tests: No results for input(s): TSH, T4TOTAL, FREET4, T3FREE, THYROIDAB in the last 72 hours. Anemia Panel: No results for input(s): VITAMINB12, FOLATE, FERRITIN, TIBC, IRON , RETICCTPCT in the last 72 hours. Sepsis Labs: Recent Labs  Lab 12/15/23 0507  PROCALCITON  1.85    Recent Results (from the past 240 hours)  Blood culture (routine x 2)     Status: None (Preliminary result)   Collection Time: 12/10/23  3:20 PM   Specimen: BLOOD  Result Value Ref Range Status   Specimen Description BLOOD RIGHT ANTECUBITAL  Final   Special Requests   Final    BOTTLES DRAWN AEROBIC AND ANAEROBIC Blood Culture adequate volume   Culture   Final    NO GROWTH 4 DAYS Performed at [facility], [address], [place], [place] [address]    Report Status PENDING  Incomplete  Resp panel by RT-PCR (RSV, Flu A&B, Covid) Anterior Nasal Swab     Status: None   Collection Time: 12/10/23  4:25 PM   Specimen: Anterior Nasal Swab  Result Value Ref Range Status   SARS Coronavirus 2 by RT PCR NEGATIVE NEGATIVE Final    Comment: (NOTE) SARS-CoV-2 target nucleic acids are NOT DETECTED.  The SARS-CoV-2 RNA is generally detectable in upper respiratory specimens during the acute phase of infection. The  lowest concentration of SARS-CoV-2 viral copies this assay can detect is 138 copies/mL. A negative result does not preclude SARS-Cov-2 infection and should not be used as the sole basis for treatment or other patient management decisions. A negative result may occur with  improper specimen collection/handling, submission of specimen other than nasopharyngeal swab, presence of viral mutation(s) within the areas targeted by this assay, and inadequate number of viral copies(<138 copies/mL). A negative result must be combined with clinical observations, patient history, and epidemiological information. The expected result is Negative.  Fact Sheet for Patients:  BloggerCourse.com  Fact Sheet for Healthcare Providers:  SeriousBroker.it  This test is no t yet approved or cleared by the [place]  FDA and  has been authorized for detection and/or diagnosis of SARS-CoV-2 by FDA under an Emergency Use Authorization  (EUA). This EUA will remain  in effect (meaning this test can be used) for the duration of the COVID-19 declaration under Section 564(b)(1) of the Act, 21 U.S.C.section 360bbb-3(b)(1), unless the authorization is terminated  or revoked sooner.       Influenza A by PCR NEGATIVE NEGATIVE Final   Influenza B by PCR NEGATIVE NEGATIVE Final    Comment: (NOTE) The Xpert Xpress SARS-CoV-2/FLU/RSV plus assay is intended as an aid in the diagnosis of influenza from Nasopharyngeal swab specimens and should not be used as a sole basis for treatment. Nasal washings and aspirates are unacceptable for Xpert Xpress SARS-CoV-2/FLU/RSV testing.  Fact Sheet for Patients: BloggerCourse.com  Fact Sheet for Healthcare Providers: SeriousBroker.it  This test is not yet approved or cleared by the [place]  FDA and has been authorized for detection and/or diagnosis of SARS-CoV-2 by FDA under an Emergency Use Authorization (EUA). This EUA will remain in effect (meaning this test can be used) for the duration of the COVID-19 declaration under Section 564(b)(1) of the Act, 21 U.S.C. section 360bbb-3(b)(1), unless the authorization is terminated or revoked.     Resp Syncytial Virus by PCR NEGATIVE NEGATIVE Final    Comment: (NOTE) Fact Sheet for Patients: BloggerCourse.com  Fact Sheet for Healthcare Providers: SeriousBroker.it  This test is not yet approved or cleared by the [place]  FDA and has been authorized for detection and/or diagnosis of SARS-CoV-2 by FDA under an Emergency Use Authorization (EUA). This EUA will remain in effect (meaning this test can be used) for the duration of the COVID-19 declaration under Section 564(b)(1) of the Act, 21 U.S.C. section 360bbb-3(b)(1), unless the authorization is terminated or revoked.  Performed at [facility], [address]  [place], [place] [address]   Urine Culture     Status: None   Collection Time: 12/10/23  4:25 PM   Specimen: Urine, Clean Catch  Result Value Ref Range Status   Specimen Description   Final    URINE, CLEAN CATCH Performed at [facility], [address], [place], [place] [address]    Special Requests   Final    NONE Performed at [facility], [address] [place], [place] [address]    Culture   Final    NO GROWTH Performed at [facility], [address] [place], [place] [address]    Report Status 12/11/2023 FINAL  Final  Blood culture (routine x 2)     Status: None (Preliminary result)   Collection Time: 12/10/23  8:41 PM   Specimen: BLOOD  Result Value Ref Range Status   Specimen Description BLOOD BLOOD LEFT HAND [facility]  Final   Special Requests  Final    BOTTLES DRAWN AEROBIC AND ANAEROBIC Blood Culture adequate volume   Culture   Final    NO GROWTH 4 DAYS Performed at [facility], [address] [place], [place] [address]    Report Status PENDING  Incomplete  MRSA Next Gen by PCR, Nasal     Status: None   Collection Time: 12/12/23 10:28 AM   Specimen: Nasal Mucosa; Nasal Swab  Result Value Ref Range Status   MRSA by PCR Next Gen NOT DETECTED NOT DETECTED Final    Comment: (NOTE) The GeneXpert MRSA Assay (FDA approved for NASAL specimens only), is one component of a comprehensive MRSA colonization surveillance program. It is not intended to diagnose MRSA infection nor to guide or monitor treatment for MRSA infections. Test performance is not FDA approved in patients less than 90 years old. Performed at [facility], [address] [place], [place] [address]   Gastrointestinal Panel by PCR , Stool     Status: None   Collection Time: 12/16/23  9:36 AM   Specimen: Stool  Result Value Ref Range Status   Campylobacter species NOT DETECTED NOT DETECTED Final   Plesimonas shigelloides NOT DETECTED NOT DETECTED Final    Salmonella species NOT DETECTED NOT DETECTED Final   Yersinia enterocolitica NOT DETECTED NOT DETECTED Final   Vibrio species NOT DETECTED NOT DETECTED Final   Vibrio cholerae NOT DETECTED NOT DETECTED Final   Enteroaggregative E coli (EAEC) NOT DETECTED NOT DETECTED Final   Enteropathogenic E coli (EPEC) NOT DETECTED NOT DETECTED Final   Enterotoxigenic E coli (ETEC) NOT DETECTED NOT DETECTED Final   Shiga like toxin producing E coli (STEC) NOT DETECTED NOT DETECTED Final   Shigella/Enteroinvasive E coli (EIEC) NOT DETECTED NOT DETECTED Final   Cryptosporidium NOT DETECTED NOT DETECTED Final   Cyclospora cayetanensis NOT DETECTED NOT DETECTED Final   Entamoeba histolytica NOT DETECTED NOT DETECTED Final   Giardia lamblia NOT DETECTED NOT DETECTED Final   Adenovirus F40/41 NOT DETECTED NOT DETECTED Final   Astrovirus NOT DETECTED NOT DETECTED Final   Norovirus GI/GII NOT DETECTED NOT DETECTED Final   Rotavirus A NOT DETECTED NOT DETECTED Final   Sapovirus (I, II, IV, and V) NOT DETECTED NOT DETECTED Final    Comment: Performed at [facility], [address], [place], [place] [address]         Radiology Studies: IR IMAGING GUIDED PORT INSERTION Result Date: 12/17/2023 INDICATION: Port-A-Cath needed for treatment of endometrial cancer. EXAM: FLUOROSCOPIC AND ULTRASOUND GUIDED PLACEMENT OF A SUBCUTANEOUS PORT MEDICATIONS: Moderate sedation ANESTHESIA/SEDATION: Moderate (conscious) sedation was employed during this procedure. A total of Versed  2 mg and fentanyl  100 mcg was administered intravenously at the order of the provider performing the procedure. Total intra-service moderate sedation time: 24 minutes. Patient's level of consciousness and vital signs were monitored continuously by radiology nurse throughout the procedure under the supervision of the provider performing the procedure. FLUOROSCOPY TIME:  Radiation Exposure Index (as provided by the fluoroscopic device): 1 mGy  Kerma COMPLICATIONS: None immediate. PROCEDURE: The procedure, risks, benefits, and alternatives were explained to the patient. Questions regarding the procedure were encouraged and answered. The patient understands and consents to the procedure. Patient was placed supine on the interventional table. Ultrasound confirmed a patent right internal jugular vein. Ultrasound image was saved for documentation. The right chest and neck were cleaned with a skin antiseptic and a sterile drape was placed. Maximal barrier sterile technique was utilized including caps, mask, sterile  gowns, sterile gloves, sterile drape, hand hygiene and skin antiseptic. The right neck was anesthetized with 1% lidocaine . Small incision was made in the right neck with a blade. Micropuncture set was placed in the right internal jugular vein with ultrasound guidance. The micropuncture wire was used for measurement purposes. The right chest was anesthetized with 1% lidocaine  with epinephrine . #15 blade was used to make an incision and a subcutaneous port pocket was formed. 8 french Power Port was assembled. Subcutaneous tunnel was formed with a stiff tunneling device. The port catheter was brought through the subcutaneous tunnel. The port was placed in the subcutaneous pocket. The micropuncture set was exchanged for a peel-away sheath. The catheter was placed through the peel-away sheath and the tip was positioned at the superior cavoatrial junction. Catheter placement was confirmed with fluoroscopy. The port was accessed and flushed with heparinized saline. The port pocket was closed using two layers of absorbable sutures and Dermabond. The vein skin site was closed using a single layer of absorbable suture and Dermabond. Sterile dressings were applied. Patient tolerated the procedure well without an immediate complication. Ultrasound and fluoroscopic images were taken and saved for this procedure. IMPRESSION: Placement of a subcutaneous  power-injectable port device. Catheter tip at the superior cavoatrial junction. Electronically Signed   By: Juliene Balder M.D.   On: 12/17/2023 16:47         Scheduled Meds:  Chlorhexidine  Gluconate Cloth  6 each Topical Daily   diphenhydrAMINE   50 mg Oral QHS   escitalopram   5 mg Oral Daily   feeding supplement  237 mL Oral TID BM   folic acid   1 mg Oral Daily   multivitamin with minerals  1 tablet Oral Daily   norethindrone   5 mg Oral TID   [START ON 12/19/2023] thiamine   100 mg Oral Daily   Continuous Infusions:  ampicillin -sulbactam (UNASYN ) IV 3 g (12/18/23 1359)     LOS: 8 days      Devaughn KATHEE Ban, MD [facility]   If 7PM-7AM, please contact night-coverage [email or web address] 12/18/2023, 2:40 PM

## 2023-12-18 NOTE — Progress Notes (Signed)
 Patient complained no PICC line inserted last night , I would  find out why for him .  Let patient know he will get one today   Cathy,Rn

## 2023-12-18 NOTE — Plan of Care (Signed)

## 2023-12-18 NOTE — Progress Notes (Signed)
 Date of Admission:  12/10/2023      ID: Valerie Leonard is a 68 y.o. female Principal Problem:   SIRS (systemic inflammatory response syndrome) (HCC) Active Problems:   Chronic alcohol abuse   Severe major depression, single episode, without psychotic features (HCC)   Anemia   Endometrial cancer (HCC)   Endometrial carcinoma (HCC)   Postmenopausal bleeding   Protein-calorie malnutrition, severe   Fever   Subjective: She is doing better Vaginal pack present since yesterday    Medications:   Chlorhexidine  Gluconate Cloth  6 each Topical Daily   diphenhydrAMINE   50 mg Oral QHS   escitalopram   5 mg Oral Daily   feeding supplement  237 mL Oral TID BM   folic acid   1 mg Oral Daily   multivitamin with minerals  1 tablet Oral Daily   norethindrone   5 mg Oral TID   [START ON 12/19/2023] thiamine   100 mg Oral Daily    Objective: Vital signs in last 24 hours: Patient Vitals for the past 24 hrs:  BP Temp Temp src Pulse Resp SpO2  12/18/23 0810 99/62 98.4 F (36.9 C) Oral 85 16 98 %  12/18/23 0358 96/64 98 F (36.7 C) -- 89 16 99 %  12/17/23 2017 104/68 98 F (36.7 C) -- 97 16 --  12/17/23 1912 (!) 92/53 98.5 F (36.9 C) Oral 92 18 99 %  12/17/23 1800 (!) 101/54 99.2 F (37.3 C) Oral 99 16 99 %  12/17/23 1727 (!) 101/54 99.2 F (37.3 C) -- 99 16 99 %  12/17/23 1726 (!) 101/54 99.2 F (37.3 C) Oral 99 16 99 %  12/17/23 1700 (!) 100/54 -- -- 98 -- 97 %  12/17/23 1645 (!) 98/54 -- -- 95 16 97 %  12/17/23 1640 (!) 104/54 99 F (37.2 C) Tympanic -- 14 98 %  12/17/23 1630 101/60 -- -- 99 14 98 %  12/17/23 1629 101/66 -- -- (!) 104 (!) 9 98 %  12/17/23 1624 104/62 -- -- (!) 112 15 99 %  12/17/23 1622 108/60 -- -- (!) 106 10 98 %  12/17/23 1619 108/60 -- -- (!) 110 (!) 9 98 %  12/17/23 1617 110/72 -- -- 100 12 98 %  12/17/23 1614 110/66 -- -- 97 15 99 %  12/17/23 1614 110/66 -- -- 98 13 99 %  12/17/23 1612 110/66 -- -- 98 13 99 %  12/17/23 1609 101/62 -- -- 91 10 98 %   12/17/23 1608 101/62 -- -- 90 12 99 %  12/17/23 1604 109/65 -- -- 87 17 99 %  12/17/23 1559 109/62 -- -- 87 17 99 %  12/17/23 1551 100/74 -- -- 88 17 99 %  12/17/23 1549 100/74 -- -- 86 13 99 %  12/17/23 1545 (!) 87/75 -- -- 91 19 99 %  12/17/23 1500 (!) 96/57 99.7 F (37.6 C) Temporal 94 20 97 %       PHYSICAL EXAM:  General: Alert, cooperative, no distress, appears stated age. Port in place Lungs: Clear to auscultation bilaterally. No Wheezing or Rhonchi. No rales. Heart: Regular rate and rhythm, no murmur, rub or gallop. Abdomen: Soft, non-tender,not distended. Bowel sounds normal. No masses Extremities: atraumatic, no cyanosis. No edema. No clubbing Skin: No rashes or lesions. Or bruising Lymph: Cervical, supraclavicular normal. Neurologic: Grossly non-focal Foley  Lab Results    Latest Ref Rng & Units 12/18/2023    6:37 AM 12/17/2023    5:33 AM 12/16/2023  3:46 AM  CBC  WBC 4.0 - 10.5 K/uL 21.5  18.8  15.0   Hemoglobin 12.0 - 15.0 g/dL 9.3  9.2  9.5   Hematocrit 36.0 - 46.0 % 29.7  29.5  30.0   Platelets 150 - 400 K/uL 633  613  562        Latest Ref Rng & Units 12/16/2023    3:46 AM 12/15/2023    5:07 AM 12/14/2023    5:02 AM  CMP  Glucose 70 - 99 mg/dL 88  90  893   BUN 8 - 23 mg/dL 11  7  7    Creatinine 0.44 - 1.00 mg/dL 9.54  9.61  9.61   Sodium 135 - 145 mmol/L 137  134  134   Potassium 3.5 - 5.1 mmol/L 3.6  3.6  4.1   Chloride 98 - 111 mmol/L 104  100  98   CO2 22 - 32 mmol/L 26  23  25    Calcium  8.9 - 10.3 mg/dL 8.2  8.3  8.7       Microbiology: Blood culture no growth    Assessment/Plan: Endometrial carcinoma with severe vaginal bleeding Awaiting chemo therapy and radiation Underwent speculum examination on 9/10 and the tumor was extending into the cervical os. There was profuse bleeding Patient now has a Foley catheter and vaginal packing Watch closely for any streptococcal infection  Fever and leucocytosis-  The source is likely the  uterus.  Necrotic tumor in anaerobic milieu is at risk for infection.  Anaerobes, Clostridium perfringens, streptococci are common organisms But the blood culture has been negative Patient has been on appropriate antibiotics and now on Unasyn  Can change to PO augmentin soon I discussed with her oncologist and she would like to keep her on suppressive antibiotic therapy as she goes through her chemo Patient will be getting palliative radiation  Thrombocytosis.  Reactive to the bleeding and the anemia  Anemia secondary to vaginal bleed.  Has received PRBC  Discussed the management with the patient and the oncologist.  Discussed with the hospitalist.  ID will no see her this weekend- On call ID available by phone for urgent issues

## 2023-12-18 NOTE — Progress Notes (Signed)
 PT Cancellation Note  Patient Details Name: Valerie Leonard MRN: [id number] DOB: 1955/05/19   Cancelled Treatment:    Reason Eval/Treat Not Completed: Patient at procedure or test/unavailable (Chart reviewed, treatrment attempted; pt OTF for test/procedure. Will resume services at later date/time.)  11:20 AM, 12/18/23 [name] Linear, PT, DPT Physical Therapist - [facility]  [phone] (ASCOM)    Annisa Mazzarella C 12/18/2023, 11:19 AM

## 2023-12-18 NOTE — Progress Notes (Signed)
 Pt is off unit for Radiation tx. Pt is A&Ox4. Pt is stable at this time.

## 2023-12-19 DIAGNOSIS — R6510 Systemic inflammatory response syndrome (SIRS) of non-infectious origin without acute organ dysfunction: Secondary | ICD-10-CM | POA: Diagnosis not present

## 2023-12-19 LAB — CBC WITH DIFFERENTIAL/PLATELET
Abs Immature Granulocytes: 0.11 K/uL — ABNORMAL HIGH (ref 0.00–0.07)
Basophils Absolute: 0.1 K/uL (ref 0.0–0.1)
Basophils Relative: 0 %
Eosinophils Absolute: 0.3 K/uL (ref 0.0–0.5)
Eosinophils Relative: 1 %
HCT: 29.3 % — ABNORMAL LOW (ref 36.0–46.0)
Hemoglobin: 9.3 g/dL — ABNORMAL LOW (ref 12.0–15.0)
Immature Granulocytes: 1 %
Lymphocytes Relative: 8 %
Lymphs Abs: 1.6 K/uL (ref 0.7–4.0)
MCH: 26.4 pg (ref 26.0–34.0)
MCHC: 31.7 g/dL (ref 30.0–36.0)
MCV: 83.2 fL (ref 80.0–100.0)
Monocytes Absolute: 0.8 K/uL (ref 0.1–1.0)
Monocytes Relative: 4 %
Neutro Abs: 16.4 K/uL — ABNORMAL HIGH (ref 1.7–7.7)
Neutrophils Relative %: 86 %
Platelets: 615 K/uL — ABNORMAL HIGH (ref 150–400)
RBC: 3.52 MIL/uL — ABNORMAL LOW (ref 3.87–5.11)
RDW: 18.3 % — ABNORMAL HIGH (ref 11.5–15.5)
WBC: 19.2 K/uL — ABNORMAL HIGH (ref 4.0–10.5)
nRBC: 0 % (ref 0.0–0.2)

## 2023-12-19 MED ORDER — SODIUM CHLORIDE 0.9% FLUSH
10.0000 mL | Freq: Two times a day (BID) | INTRAVENOUS | Status: DC
  Administered 2023-12-19 – 2023-12-22 (×8): 10 mL
  Administered 2023-12-23: 20 mL
  Administered 2023-12-23 – 2023-12-25 (×4): 10 mL

## 2023-12-19 MED ORDER — SODIUM CHLORIDE 0.9% FLUSH: 10.0000 mL | INTRAVENOUS | Status: DC | PRN

## 2023-12-19 NOTE — Plan of Care (Signed)

## 2023-12-19 NOTE — Progress Notes (Signed)
 Mobility Specialist Progress Note:    12/19/23 1600  Mobility  Activity Refused and notified nurse if applicable   Pt kindly refused mobility d/t vaginal soreness and fatigue. Will attempt at a later time.   Sherrilee Ditty Mobility Specialist Please contact via Special educational needs teacher or  Rehab office at [phone]

## 2023-12-19 NOTE — Progress Notes (Signed)
 PROGRESS NOTE   HPI was taken from Dr. Sim: Valerie Leonard is a 68 y.o. female with medical history significant of endometrial cancer not yet on chemotherapy, depressive disorder, history of alcoholism, who was at the cancer center today to get her access for initiating chemotherapy.  She was noted to have fever and concern for sepsis was raised.  She was subsequently sent over to to the ER for evaluation.  She had recent UTI for which she was treated with cefdinir .  The urine culture was nondiagnostic with multiple species.  Today however patient noted to have white count of 26,000.Lactic acid 2.2.  She had a temperature 102.2.  Also hypotensive on arrival with systolic of 70 and diastolic 48.  Patient appears to have severe sepsis.  The source is not clear but suspicion for possible bacteremia.  Patient is therefore being admitted with sepsis of unknown source.  She is confused.  Appears to have acute metabolic encephalopathy from the incident.     SHIMIKA AMES  FMW:[id number] DOB: October 21, 1955 DOA: 12/10/2023 PCP: Pcp, No    Assessment & Plan:   Principal Problem:   SIRS (systemic inflammatory response syndrome) (HCC) Active Problems:   Chronic alcohol abuse   Severe major depression, single episode, without psychotic features (HCC)   Anemia   Endometrial cancer (HCC)   Endometrial carcinoma (HCC)   Postmenopausal bleeding   Protein-calorie malnutrition, severe   Fever  Assessment and Plan: SIRS:  No obvious source   Blood cxs NGTD. Urine cx shows no growth. CXR was neg. RSV, influenza, & COVID19 are all neg. New diarrhea but has had multiple stool softeners. Ceftiraxone/flagyl  converted to unasyn  by ID. CT/abd pelvis had limited evaluation of uterine mass. Per gyn/onc no pyometra. ID thinks likely translocation of bacteria caused by malignancy. Will plan on augmentin at d/c.   Diarrhea: likely 2/2 stool softeners. Improved. Gi pathogen panel neg   Endometrial cancer with bleeding:  stage III. Oncologist is Dr. Melanee. Port placed 9/11. Onco following and recs apprec, gyn onc now also involved. Packing placed in vagina 9/10, cervix is involved. Rad onc involved, first radiation on 9/12, plan for total of 10 treatments. Hopefully radiation helps control bleeding. Chemo planned after course of radiation. Foley placed 9/10 and discontinued today, packing removed by gyn today and bleeding appears controlled. Will monitor.   ACD: secondary to endometrial bleed. S/p 1 unit of pRBCs transfused so far. S/p IV iron  x 1 as per onco. H&H stable for now despite above bleeding  Hx of alcohol abuse: remote hx. Will continue to monitor     Acute metabolic encephalopathy: resolved  Hypotension: persists. With above bleeding. Relatively asymptomatic.        DVT prophylaxis: SCDs Code Status: full  Family Communication: none at bedside Disposition Plan: snf, toc consulted  Level of care: Telemetry Medical  Status is: Inpatient Remains inpatient appropriate because: severity of illness, requiring IV abxs, monitoring of bleeding, radiation therapy    Consultants:  ID Onco   Procedures:   Antimicrobials: rocephin , flagyl  > unasyn    Subjective: No pain or fevers. Bleeding less. Loose bms, not watery  Objective: Vitals:   12/18/23 1620 12/18/23 2049 12/19/23 0459 12/19/23 0829  BP: 99/73 108/64 112/75 (!) 94/45  Pulse:  100 90 97  Resp: 20 16  14   Temp: 98.7 F (37.1 C) 97.9 F (36.6 C) 98.7 F (37.1 C) 97.8 F (36.6 C)  TempSrc: Oral  Oral   SpO2: 98% 96% 98% 96%  Weight:      Height:        Intake/Output Summary (Last 24 hours) at 12/19/2023 1554 Last data filed at 12/19/2023 1340 Gross per 24 hour  Intake 1493.66 ml  Output 1550 ml  Net -56.34 ml   Filed Weights   12/10/23 1517  Weight: 49 kg    Examination:  General exam: chronically ill appearing Respiratory system: clear breath sounds b/l Cardiovascular system: S1 & S2+. No rubs or clicks    Gastrointestinal system: abd is soft, NT  Central nervous system: alert & awake. Moves all extremities  Psychiatry: Judgement and insight appears normal    Data Reviewed: I have personally reviewed following labs and imaging studies  CBC: Recent Labs  Lab 12/14/23 0502 12/15/23 0507 12/16/23 0346 12/17/23 0533 12/18/23 0637  WBC 13.1* 16.9* 15.0* 18.8* 21.5*  HGB 10.1* 9.9* 9.5* 9.2* 9.3*  HCT 31.9* 31.7* 30.0* 29.5* 29.7*  MCV 82.6 83.6 83.1 83.3 83.2  PLT 575* 607* 562* 613* 633*   Basic Metabolic Panel: Recent Labs  Lab 12/13/23 0409 12/14/23 0502 12/15/23 0507 12/16/23 0346  NA 134* 134* 134* 137  K 4.3 4.1 3.6 3.6  CL 100 98 100 104  CO2 26 25 23 26   GLUCOSE 97 106* 90 88  BUN 8 7* 7* 11  CREATININE 0.42* 0.38* 0.38* 0.45  CALCIUM  8.5* 8.7* 8.3* 8.2*   GFR: Estimated Creatinine Clearance: 51.5 mL/min (by C-G formula based on SCr of 0.45 mg/dL). Liver Function Tests: No results for input(s): AST, ALT, ALKPHOS, BILITOT, PROT, ALBUMIN in the last 168 hours.  No results for input(s): LIPASE, AMYLASE in the last 168 hours. No results for input(s): AMMONIA in the last 168 hours. Coagulation Profile: No results for input(s): INR, PROTIME in the last 168 hours.  Cardiac Enzymes: No results for input(s): CKTOTAL, CKMB, CKMBINDEX, TROPONINI in the last 168 hours. BNP (last 3 results) No results for input(s): PROBNP in the last 8760 hours. HbA1C: No results for input(s): HGBA1C in the last 72 hours. CBG: No results for input(s): GLUCAP in the last 168 hours. Lipid Profile: No results for input(s): CHOL, HDL, LDLCALC, TRIG, CHOLHDL, LDLDIRECT in the last 72 hours. Thyroid  Function Tests: No results for input(s): TSH, T4TOTAL, FREET4, T3FREE, THYROIDAB in the last 72 hours. Anemia Panel: No results for input(s): VITAMINB12, FOLATE, FERRITIN, TIBC, IRON , RETICCTPCT in the last 72 hours. Sepsis  Labs: Recent Labs  Lab 12/15/23 0507  PROCALCITON 1.85    Recent Results (from the past 240 hours)  Blood culture (routine x 2)     Status: None   Collection Time: 12/10/23  3:20 PM   Specimen: BLOOD  Result Value Ref Range Status   Specimen Description   Final    BLOOD RIGHT ANTECUBITAL Performed at [facility], [address], [place], [place] [address]    Special Requests   Final    BOTTLES DRAWN AEROBIC AND ANAEROBIC Blood Culture adequate volume Performed at [facility], [address] [place], [place] [address]    Culture   Final    NO GROWTH 5 DAYS Performed at [facility], [address], [place], [place] [address]    Report Status 12/18/2023 FINAL  Final  Resp panel by RT-PCR (RSV, Flu A&B, Covid) Anterior Nasal Swab     Status: None   Collection Time: 12/10/23  4:25 PM   Specimen: Anterior Nasal Swab  Result Value Ref Range Status   SARS Coronavirus 2 by RT  PCR NEGATIVE NEGATIVE Final    Comment: (NOTE) SARS-CoV-2 target nucleic acids are NOT DETECTED.  The SARS-CoV-2 RNA is generally detectable in upper respiratory specimens during the acute phase of infection. The lowest concentration of SARS-CoV-2 viral copies this assay can detect is 138 copies/mL. A negative result does not preclude SARS-Cov-2 infection and should not be used as the sole basis for treatment or other patient management decisions. A negative result may occur with  improper specimen collection/handling, submission of specimen other than nasopharyngeal swab, presence of viral mutation(s) within the areas targeted by this assay, and inadequate number of viral copies(<138 copies/mL). A negative result must be combined with clinical observations, patient history, and epidemiological information. The expected result is Negative.  Fact Sheet for Patients:  BloggerCourse.com  Fact Sheet for Healthcare Providers:   SeriousBroker.it  This test is no t yet approved or cleared by the [place]  FDA and  has been authorized for detection and/or diagnosis of SARS-CoV-2 by FDA under an Emergency Use Authorization (EUA). This EUA will remain  in effect (meaning this test can be used) for the duration of the COVID-19 declaration under Section 564(b)(1) of the Act, 21 U.S.C.section 360bbb-3(b)(1), unless the authorization is terminated  or revoked sooner.       Influenza A by PCR NEGATIVE NEGATIVE Final   Influenza B by PCR NEGATIVE NEGATIVE Final    Comment: (NOTE) The Xpert Xpress SARS-CoV-2/FLU/RSV plus assay is intended as an aid in the diagnosis of influenza from Nasopharyngeal swab specimens and should not be used as a sole basis for treatment. Nasal washings and aspirates are unacceptable for Xpert Xpress SARS-CoV-2/FLU/RSV testing.  Fact Sheet for Patients: BloggerCourse.com  Fact Sheet for Healthcare Providers: SeriousBroker.it  This test is not yet approved or cleared by the [place]  FDA and has been authorized for detection and/or diagnosis of SARS-CoV-2 by FDA under an Emergency Use Authorization (EUA). This EUA will remain in effect (meaning this test can be used) for the duration of the COVID-19 declaration under Section 564(b)(1) of the Act, 21 U.S.C. section 360bbb-3(b)(1), unless the authorization is terminated or revoked.     Resp Syncytial Virus by PCR NEGATIVE NEGATIVE Final    Comment: (NOTE) Fact Sheet for Patients: BloggerCourse.com  Fact Sheet for Healthcare Providers: SeriousBroker.it  This test is not yet approved or cleared by the [place]  FDA and has been authorized for detection and/or diagnosis of SARS-CoV-2 by FDA under an Emergency Use Authorization (EUA). This EUA will remain in effect (meaning this test can be used) for  the duration of the COVID-19 declaration under Section 564(b)(1) of the Act, 21 U.S.C. section 360bbb-3(b)(1), unless the authorization is terminated or revoked.  Performed at [facility], [address] [place], [place] [address]   Urine Culture     Status: None   Collection Time: 12/10/23  4:25 PM   Specimen: Urine, Clean Catch  Result Value Ref Range Status   Specimen Description   Final    URINE, CLEAN CATCH Performed at [facility], [address], [place], [place] [address]    Special Requests   Final    NONE Performed at [facility], [address], [place], [place] [address]    Culture   Final    NO GROWTH Performed at [facility], [address] [place], [place] [address]    Report Status 12/11/2023 FINAL  Final  Blood culture (routine x 2)     Status: None  Collection Time: 12/10/23  8:41 PM   Specimen: BLOOD  Result Value Ref Range Status   Specimen Description   Final    BLOOD BLOOD LEFT HAND [facility] Performed at [facility], [address] [place], [place] [address]    Special Requests   Final    BOTTLES DRAWN AEROBIC AND ANAEROBIC Blood Culture adequate volume Performed at [facility], [address] [place], [place] [address]    Culture   Final    NO GROWTH 5 DAYS Performed at [facility], [address], [place], [place] [address]    Report Status 12/18/2023 FINAL  Final  MRSA Next Gen by PCR, Nasal     Status: None   Collection Time: 12/12/23 10:28 AM   Specimen: Nasal Mucosa; Nasal Swab  Result Value Ref Range Status   MRSA by PCR Next Gen NOT DETECTED NOT DETECTED Final    Comment: (NOTE) The GeneXpert MRSA Assay (FDA approved for NASAL specimens only), is one component of a comprehensive MRSA colonization surveillance program. It is not intended to diagnose MRSA infection nor to guide or monitor treatment for MRSA infections. Test performance is not FDA approved in patients  less than 89 years old. Performed at [facility], [address] [place], [place] [address]   Gastrointestinal Panel by PCR , Stool     Status: None   Collection Time: 12/16/23  9:36 AM   Specimen: Stool  Result Value Ref Range Status   Campylobacter species NOT DETECTED NOT DETECTED Final   Plesimonas shigelloides NOT DETECTED NOT DETECTED Final   Salmonella species NOT DETECTED NOT DETECTED Final   Yersinia enterocolitica NOT DETECTED NOT DETECTED Final   Vibrio species NOT DETECTED NOT DETECTED Final   Vibrio cholerae NOT DETECTED NOT DETECTED Final   Enteroaggregative E coli (EAEC) NOT DETECTED NOT DETECTED Final   Enteropathogenic E coli (EPEC) NOT DETECTED NOT DETECTED Final   Enterotoxigenic E coli (ETEC) NOT DETECTED NOT DETECTED Final   Shiga like toxin producing E coli (STEC) NOT DETECTED NOT DETECTED Final   Shigella/Enteroinvasive E coli (EIEC) NOT DETECTED NOT DETECTED Final   Cryptosporidium NOT DETECTED NOT DETECTED Final   Cyclospora cayetanensis NOT DETECTED NOT DETECTED Final   Entamoeba histolytica NOT DETECTED NOT DETECTED Final   Giardia lamblia NOT DETECTED NOT DETECTED Final   Adenovirus F40/41 NOT DETECTED NOT DETECTED Final   Astrovirus NOT DETECTED NOT DETECTED Final   Norovirus GI/GII NOT DETECTED NOT DETECTED Final   Rotavirus A NOT DETECTED NOT DETECTED Final   Sapovirus (I, II, IV, and V) NOT DETECTED NOT DETECTED Final    Comment: Performed at [facility], [address], [place], [place] [address]         Radiology Studies: IR IMAGING GUIDED PORT INSERTION Result Date: 12/17/2023 INDICATION: Port-A-Cath needed for treatment of endometrial cancer. EXAM: FLUOROSCOPIC AND ULTRASOUND GUIDED PLACEMENT OF A SUBCUTANEOUS PORT MEDICATIONS: Moderate sedation ANESTHESIA/SEDATION: Moderate (conscious) sedation was employed during this procedure. A total of Versed  2 mg and fentanyl  100 mcg was administered intravenously at the order of  the provider performing the procedure. Total intra-service moderate sedation time: 24 minutes. Patient's level of consciousness and vital signs were monitored continuously by radiology nurse throughout the procedure under the supervision of the provider performing the procedure. FLUOROSCOPY TIME:  Radiation Exposure Index (as provided by the fluoroscopic device): 1 mGy Kerma COMPLICATIONS: None immediate. PROCEDURE: The procedure, risks, benefits, and alternatives were explained to the patient.  Questions regarding the procedure were encouraged and answered. The patient understands and consents to the procedure. Patient was placed supine on the interventional table. Ultrasound confirmed a patent right internal jugular vein. Ultrasound image was saved for documentation. The right chest and neck were cleaned with a skin antiseptic and a sterile drape was placed. Maximal barrier sterile technique was utilized including caps, mask, sterile gowns, sterile gloves, sterile drape, hand hygiene and skin antiseptic. The right neck was anesthetized with 1% lidocaine . Small incision was made in the right neck with a blade. Micropuncture set was placed in the right internal jugular vein with ultrasound guidance. The micropuncture wire was used for measurement purposes. The right chest was anesthetized with 1% lidocaine  with epinephrine . #15 blade was used to make an incision and a subcutaneous port pocket was formed. 8 french Power Port was assembled. Subcutaneous tunnel was formed with a stiff tunneling device. The port catheter was brought through the subcutaneous tunnel. The port was placed in the subcutaneous pocket. The micropuncture set was exchanged for a peel-away sheath. The catheter was placed through the peel-away sheath and the tip was positioned at the superior cavoatrial junction. Catheter placement was confirmed with fluoroscopy. The port was accessed and flushed with heparinized saline. The port pocket was closed  using two layers of absorbable sutures and Dermabond. The vein skin site was closed using a single layer of absorbable suture and Dermabond. Sterile dressings were applied. Patient tolerated the procedure well without an immediate complication. Ultrasound and fluoroscopic images were taken and saved for this procedure. IMPRESSION: Placement of a subcutaneous power-injectable port device. Catheter tip at the superior cavoatrial junction. Electronically Signed   By: Juliene Balder M.D.   On: 12/17/2023 16:47         Scheduled Meds:  Chlorhexidine  Gluconate Cloth  6 each Topical Daily   diphenhydrAMINE   50 mg Oral QHS   escitalopram   5 mg Oral Daily   feeding supplement  237 mL Oral TID BM   folic acid   1 mg Oral Daily   multivitamin with minerals  1 tablet Oral Daily   norethindrone   5 mg Oral TID   sodium chloride  flush  10-40 mL Intracatheter Q12H   thiamine   100 mg Oral Daily   Continuous Infusions:  ampicillin -sulbactam (UNASYN ) IV Stopped (12/19/23 1340)     LOS: 9 days      Devaughn KATHEE Ban, MD [facility]   If 7PM-7AM, please contact night-coverage [email or web address] 12/19/2023, 3:54 PM

## 2023-12-19 NOTE — Progress Notes (Signed)
 Gynecologic Oncology Inpatient Note   Chief Complaint: sepsis, bleeding due to gynecologic malignancy  Subjective:  Valerie Leonard is a 68 y.o. female currently admitted to hospital for sepsis.  Nursing removed foley this morning believing that packing had been removed however, later found to still be in place. No reports of bleeding. Patient says she feels well. Is eager to go home. Denies pain or fevers today. Tolerated her radiation well.    Problem List: Patient Active Problem List   Diagnosis Date Noted   Fever 12/17/2023   SIRS (systemic inflammatory response syndrome) (HCC) 12/16/2023   Postmenopausal bleeding 12/16/2023   Protein-calorie malnutrition, severe 12/16/2023   Endometrial carcinoma (HCC) 12/14/2023   HFrEF (heart failure with reduced ejection fraction) (HCC) 12/03/2023   Endometrial cancer (HCC) 11/11/2023   Anemia 11/04/2023   ABLA (acute blood loss anemia) 09/28/2023   Pressure injury of skin 09/28/2023   Vaginal bleeding 09/27/2023   Severe major depression, single episode, without psychotic features (HCC) 07/26/2020   Depression    Acute delirium 07/25/2020   Acute metabolic encephalopathy    Hypomagnesemia    Chronic alcohol abuse    Hypokalemia 07/24/2020    Past Medical History: Past Medical History:  Diagnosis Date   Acute delirium    Acute metabolic encephalopathy    Anemia    blood loss anemia   Chronic alcohol abuse    Depression    Endometrial cancer (HCC)    Hypokalemia    Hypomagnesemia    Pressure injury of skin    Severe major depression, single episode, without psychotic features (HCC)    Vaginal bleeding     Past Surgical History: Past Surgical History:  Procedure Laterality Date   IR IMAGING GUIDED PORT INSERTION  12/17/2023   IR PATIENT EVAL TECH 0-60 MINS  12/10/2023   NO PAST SURGERIES      Past Gynecologic History:  See prior note  OB History:  OB History  No obstetric history on file.    Family History: History  reviewed. No pertinent family history.  Social History: Social History   Socioeconomic History   Marital status: Single    Spouse name: Not on file   Number of children: 0   Years of education: Not on file   Highest education level: Not on file  Occupational History   Not on file  Tobacco Use   Smoking status: Former    Types: Cigarettes, Cigars   Smokeless tobacco: Never  Vaping Use   Vaping status: Never Used  Substance and Sexual Activity   Alcohol use: Yes    Alcohol/week: 3.0 standard drinks of alcohol    Types: 3 Cans of beer per week    Comment: More than 4 glasses of wine, mixed drinks, beer daily.    Drug use: Not Currently    Comment: former use of marijuana   Sexual activity: Not Currently    Birth control/protection: None  Other Topics Concern   Not on file  Social History Narrative   Lives by herself    Social Drivers of Health   Financial Resource Strain: Low Risk  (10/13/2023)   Received from [facility]   Overall Financial Resource Strain (CARDIA)    Difficulty of Paying Living Expenses: Not very hard  Food Insecurity: No Food Insecurity (12/10/2023)   Hunger Vital Sign    Worried About Running Out of Food in the Last Year: Never true    Ran Out of Food in the Last  Year: Never true  Recent Concern: Food Insecurity - Food Insecurity Present (09/27/2023)   Hunger Vital Sign    Worried About Running Out of Food in the Last Year: Sometimes true    Ran Out of Food in the Last Year: Sometimes true  Transportation Needs: No Transportation Needs (12/10/2023)   PRAPARE - Administrator, Civil Service (Medical): No    Lack of Transportation (Non-Medical): No  Recent Concern: Transportation Needs - Unmet Transportation Needs (09/27/2023)   PRAPARE - Administrator, Civil Service (Medical): Yes    Lack of Transportation (Non-Medical): Yes  Physical Activity: Not on file  Stress: Not on file  Social Connections: Unknown  (12/11/2023)   Social Connection and Isolation Panel    Frequency of Communication with Friends and Family: More than three times a week    Frequency of Social Gatherings with Friends and Family: More than three times a week    Attends Religious Services: Patient declined    Database administrator or Organizations: Patient declined    Attends Banker Meetings: Patient declined    Marital Status: Widowed  Recent Concern: Social Connections - Socially Isolated (09/28/2023)   Social Connection and Isolation Panel    Frequency of Communication with Friends and Family: Never    Frequency of Social Gatherings with Friends and Family: Never    Attends Religious Services: Never    Database administrator or Organizations: No    Attends Banker Meetings: Never    Marital Status: Widowed  Intimate Partner Violence: Not At Risk (12/10/2023)   Humiliation, Afraid, Rape, and Kick questionnaire    Fear of Current or Ex-Partner: No    Emotionally Abused: No    Physically Abused: No    Sexually Abused: No    Allergies: No Known Allergies  Current Medications: Current Facility-Administered Medications  Medication Dose Route Frequency Provider Last Rate Last Admin   acetaminophen  (TYLENOL ) tablet 650 mg  650 mg Oral Q6H PRN Garba, Mohammad L, MD   650 mg at 12/19/23 0531   Or   acetaminophen  (TYLENOL ) suppository 650 mg  650 mg Rectal Q6H PRN Sim Emery CROME, MD   650 mg at 12/17/23 0751   ALPRAZolam  (XANAX ) tablet 0.25 mg  0.25 mg Oral BID PRN Trudy Anthony HERO, MD   0.25 mg at 12/18/23 2043   Ampicillin -Sulbactam (UNASYN ) 3 g in sodium chloride  0.9 % 100 mL IVPB  3 g Intravenous Q6H Fayette Bodily, MD   Stopped at 12/19/23 1340   Chlorhexidine  Gluconate Cloth 2 % PADS 6 each  6 each Topical Daily Adaly Puder G, NP   6 each at 12/19/23 1300   diphenhydrAMINE  (BENADRYL ) capsule 50 mg  50 mg Oral QHS Trudy Anthony HERO, MD   50 mg at 12/18/23 2043   escitalopram   (LEXAPRO ) tablet 5 mg  5 mg Oral Daily Trudy Anthony HERO, MD   5 mg at 12/19/23 1012   feeding supplement (ENSURE PLUS HIGH PROTEIN) liquid 237 mL  237 mL Oral TID BM Wouk, Devaughn Sayres, MD   237 mL at 12/19/23 1011   folic acid  (FOLVITE ) tablet 1 mg  1 mg Oral Daily Wouk, Devaughn Sayres, MD   1 mg at 12/19/23 1012   multivitamin with minerals tablet 1 tablet  1 tablet Oral Daily Kandis Devaughn Sayres, MD   1 tablet at 12/19/23 1012   norethindrone  (AYGESTIN ) tablet 5 mg  5 mg Oral TID Kandis Devaughn  Bedford, MD   5 mg at 12/19/23 1012   ondansetron  (ZOFRAN ) tablet 4 mg  4 mg Oral Q6H PRN Sim Emery CROME, MD       Or   ondansetron  (ZOFRAN ) injection 4 mg  4 mg Intravenous Q6H PRN Sim Emery CROME, MD   4 mg at 12/15/23 0524   sodium chloride  flush (NS) 0.9 % injection 10-40 mL  10-40 mL Intracatheter Q12H Wouk, Devaughn Sayres, MD   10 mL at 12/19/23 1311   sodium chloride  flush (NS) 0.9 % injection 10-40 mL  10-40 mL Intracatheter PRN Wouk, Devaughn Sayres, MD       thiamine  (VITAMIN B1) tablet 100 mg  100 mg Oral Daily Kandis Devaughn Sayres, MD   100 mg at 12/19/23 1012   Review of Systems General:  no complaints Skin: no complaints Eyes: no complaints HEENT: no complaints Breasts: no complaints Pulmonary: no complaints Cardiac: no complaints Gastrointestinal: no complaints Genitourinary/Sexual: no complaints Ob/Gyn: no complaints Musculoskeletal: no complaints Hematology: no complaints Neurologic/Psych: no complaints   Objective:  Physical Examination:  BP (!) 94/45 (BP Location: Left Arm)   Pulse 97   Temp 97.8 F (36.6 C)   Resp 14   Ht 5' 1 (1.549 m)   Wt 108 lb (49 kg)   SpO2 96%   BMI 20.41 kg/m     General appearance: alert, cooperative, and appears stated age Cardiovascular: without murmurs, rubs or gallops Respiratory: clear to auscultation Abdomen: no palpable masses soft nontender, nondistended bowel sounds present without hepatosplenomegaly Soft bowel movement. Peri  care provided.  Skin exam - normal coloration and turgor, no rashes. Stage 2 pressure ulcer on buttock Neurological exam reveals alert, oriented, normal speech, no focal findings or movement disorder noted. Pelvic: Chaperoned by RN & CMA: Packing in place. Foley was removed by nursing.       Assessment:  Valerie Leonard is a 68 y.o. 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  Pulmonary nodules are not definitive.  She now has obvious cervical involvement. Biopsy pending.  She was symptomatic with heavy bleeding after exam yesterday and required vaginal packing to control bleeding and hemorrhage.  Started radiation on 12/18/23. Tolerated well. Removed vaginal packing today along with foley. No evidence of re-bleeding initially.   Hmg 9.3. WBC 21.5. Plt 633. Temp 97.8.   Pressure ulcer- followed by dietician  Port-a-cath- functioning appropriately  Plan:   Advised nursing to continue to monitor vaginal bleeding. If heavy bleeding or hemorrhage I will plan to replace packing and foley to provide tamponade against the bleeding tumor site. She will continue palliative radiation to her uterus and upper vagina for bleeding control. Plan is for 30 Gray in 10 fractions.    She will continue broad spectrum IV antibiotics for now. Monitoring her wbc daily. Afebrile. Managed by medical oncology and appreciate input from infectious disease.   Will continue to follow.  Tinnie KANDICE Dawn, NP

## 2023-12-20 DIAGNOSIS — R6510 Systemic inflammatory response syndrome (SIRS) of non-infectious origin without acute organ dysfunction: Secondary | ICD-10-CM | POA: Diagnosis not present

## 2023-12-20 LAB — CBC WITH DIFFERENTIAL/PLATELET
Abs Immature Granulocytes: 0.1 K/uL — ABNORMAL HIGH (ref 0.00–0.07)
Basophils Absolute: 0.1 K/uL (ref 0.0–0.1)
Basophils Relative: 0 %
Eosinophils Absolute: 0.3 K/uL (ref 0.0–0.5)
Eosinophils Relative: 2 %
HCT: 29.4 % — ABNORMAL LOW (ref 36.0–46.0)
Hemoglobin: 9.1 g/dL — ABNORMAL LOW (ref 12.0–15.0)
Immature Granulocytes: 1 %
Lymphocytes Relative: 15 %
Lymphs Abs: 2.2 K/uL (ref 0.7–4.0)
MCH: 25.8 pg — ABNORMAL LOW (ref 26.0–34.0)
MCHC: 31 g/dL (ref 30.0–36.0)
MCV: 83.3 fL (ref 80.0–100.0)
Monocytes Absolute: 0.8 K/uL (ref 0.1–1.0)
Monocytes Relative: 5 %
Neutro Abs: 11.1 K/uL — ABNORMAL HIGH (ref 1.7–7.7)
Neutrophils Relative %: 77 %
Platelets: 632 K/uL — ABNORMAL HIGH (ref 150–400)
RBC: 3.53 MIL/uL — ABNORMAL LOW (ref 3.87–5.11)
RDW: 18.3 % — ABNORMAL HIGH (ref 11.5–15.5)
WBC: 14.5 K/uL — ABNORMAL HIGH (ref 4.0–10.5)
nRBC: 0 % (ref 0.0–0.2)

## 2023-12-20 MED ORDER — LOPERAMIDE HCL 2 MG PO CAPS
2.0000 mg | ORAL_CAPSULE | ORAL | Status: DC | PRN
  Administered 2023-12-20 (×2): 2 mg via ORAL
  Filled 2023-12-20 (×2): qty 1

## 2023-12-20 NOTE — Progress Notes (Signed)
 PROGRESS NOTE   HPI was taken from Dr. Sim: Valerie Leonard is a 68 y.o. female with medical history significant of endometrial cancer not yet on chemotherapy, depressive disorder, history of alcoholism, who was at the cancer center today to get her access for initiating chemotherapy.  She was noted to have fever and concern for sepsis was raised.  She was subsequently sent over to to the ER for evaluation.  She had recent UTI for which she was treated with cefdinir .  The urine culture was nondiagnostic with multiple species.  Today however patient noted to have white count of 26,000.Lactic acid 2.2.  She had a temperature 102.2.  Also hypotensive on arrival with systolic of 70 and diastolic 48.  Patient appears to have severe sepsis.  The source is not clear but suspicion for possible bacteremia.  Patient is therefore being admitted with sepsis of unknown source.  She is confused.  Appears to have acute metabolic encephalopathy from the incident.     Valerie Leonard  FMW:[id number] DOB: [birth year] DOA: 12/10/2023 PCP: Pcp, No    Assessment & Plan:   Principal Problem:   SIRS (systemic inflammatory response syndrome) (HCC) Active Problems:   Chronic alcohol abuse   Severe major depression, single episode, without psychotic features (HCC)   Anemia   Endometrial cancer (HCC)   Endometrial carcinoma (HCC)   Postmenopausal bleeding   Protein-calorie malnutrition, severe   Fever  Assessment and Plan: SIRS:  No obvious source   Blood cxs NGTD. Urine cx shows no growth. CXR was neg. RSV, influenza, & COVID19 are all neg. New diarrhea but has had multiple stool softeners. Ceftiraxone/flagyl  converted to unasyn  by ID. CT/abd pelvis had limited evaluation of uterine mass. Per gyn/onc no pyometra. ID thinks likely translocation of bacteria caused by malignancy. Will plan on augmentin at d/c.   Diarrhea: likely 2/2 stool softeners. Worse last 24. Discussed with ID as no fever or sig abd pain and as  wbc downtrending will hold on c diff eval, treat with loperamide . Gi pathogen panel neg   Endometrial cancer with bleeding: stage III. Oncologist is Dr. Melanee. Port placed 9/11. Onco following and recs apprec, gyn onc now also involved. Packing placed in vagina 9/10, cervix is involved. Rad onc involved, first radiation on 9/12, plan for total of 10 treatments. Hopefully radiation helps control bleeding. Chemo planned after course of radiation. Foley placed 9/10 and discontinued 9/13, packing removed by gyn 9/13 and bleeding appears controlled. Will monitor.   ACD: secondary to endometrial bleed. S/p 1 unit of pRBCs transfused so far. S/p IV iron  x 1 as per onco. H&H stable for now despite above bleeding  Hx of alcohol abuse: remote hx. Will continue to monitor     Acute metabolic encephalopathy: resolved  Hypotension: persists. With above bleeding. Relatively asymptomatic.        DVT prophylaxis: SCDs Code Status: full  Family Communication: sister Olam updated telephonically 9/14 (is hcpoa) Disposition Plan: snf, toc consulted  Level of care: Telemetry Medical  Status is: Inpatient Remains inpatient appropriate because: severity of illness, requiring IV abxs, monitoring of bleeding, radiation therapy    Consultants:  ID Onco   Procedures:   Antimicrobials: rocephin , flagyl  > unasyn    Subjective: No sig pain or fevers. Bleeding minimal. Loose bms, not watery  Objective: Vitals:   12/19/23 1929 12/19/23 2000 12/20/23 0341 12/20/23 0745  BP: 98/62 98/62 92/66  102/63  Pulse: (!) 103 100 85 88  Resp:  14  18  Temp: 98.1 F (36.7 C) 98.1 F (36.7 C) 98.3 F (36.8 C) 97.9 F (36.6 C)  TempSrc: Oral     SpO2: 99% 99% 100% 99%  Weight:      Height:        Intake/Output Summary (Last 24 hours) at 12/20/2023 1259 Last data filed at 12/20/2023 1239 Gross per 24 hour  Intake 1253.76 ml  Output --  Net 1253.76 ml   Filed Weights   12/10/23 1517  Weight: 49 kg     Examination:  General exam: chronically ill appearing Respiratory system: clear breath sounds b/l Cardiovascular system: S1 & S2+. No rubs or clicks   Gastrointestinal system: abd is soft, NT  Central nervous system: alert & awake. Moves all extremities  Psychiatry: Judgement and insight appears normal    Data Reviewed: I have personally reviewed following labs and imaging studies  CBC: Recent Labs  Lab 12/16/23 0346 12/17/23 0533 12/18/23 0637 12/19/23 1614 12/20/23 0503  WBC 15.0* 18.8* 21.5* 19.2* 14.5*  NEUTROABS  --   --   --  16.4* 11.1*  HGB 9.5* 9.2* 9.3* 9.3* 9.1*  HCT 30.0* 29.5* 29.7* 29.3* 29.4*  MCV 83.1 83.3 83.2 83.2 83.3  PLT 562* 613* 633* 615* 632*   Basic Metabolic Panel: Recent Labs  Lab 12/14/23 0502 12/15/23 0507 12/16/23 0346  NA 134* 134* 137  K 4.1 3.6 3.6  CL 98 100 104  CO2 25 23 26   GLUCOSE 106* 90 88  BUN 7* 7* 11  CREATININE 0.38* 0.38* 0.45  CALCIUM  8.7* 8.3* 8.2*   GFR: Estimated Creatinine Clearance: 51.5 mL/min (by C-G formula based on SCr of 0.45 mg/dL). Liver Function Tests: No results for input(s): AST, ALT, ALKPHOS, BILITOT, PROT, ALBUMIN in the last 168 hours.  No results for input(s): LIPASE, AMYLASE in the last 168 hours. No results for input(s): AMMONIA in the last 168 hours. Coagulation Profile: No results for input(s): INR, PROTIME in the last 168 hours.  Cardiac Enzymes: No results for input(s): CKTOTAL, CKMB, CKMBINDEX, TROPONINI in the last 168 hours. BNP (last 3 results) No results for input(s): PROBNP in the last 8760 hours. HbA1C: No results for input(s): HGBA1C in the last 72 hours. CBG: No results for input(s): GLUCAP in the last 168 hours. Lipid Profile: No results for input(s): CHOL, HDL, LDLCALC, TRIG, CHOLHDL, LDLDIRECT in the last 72 hours. Thyroid  Function Tests: No results for input(s): TSH, T4TOTAL, FREET4, T3FREE, THYROIDAB in the  last 72 hours. Anemia Panel: No results for input(s): VITAMINB12, FOLATE, FERRITIN, TIBC, IRON , RETICCTPCT in the last 72 hours. Sepsis Labs: Recent Labs  Lab 12/15/23 0507  PROCALCITON 1.85    Recent Results (from the past 240 hours)  Blood culture (routine x 2)     Status: None   Collection Time: 12/10/23  3:20 PM   Specimen: BLOOD  Result Value Ref Range Status   Specimen Description   Final    BLOOD RIGHT ANTECUBITAL Performed at [facility], [address], [place], [place] [address]    Special Requests   Final    BOTTLES DRAWN AEROBIC AND ANAEROBIC Blood Culture adequate volume Performed at [facility], [address], [place], [place] [address]    Culture   Final    NO GROWTH 5 DAYS Performed at [facility], [address], [place], [place] [address]    Report Status 12/18/2023 FINAL  Final  Resp panel by RT-PCR (RSV, Flu A&B, Covid) Anterior Nasal Swab  Status: None   Collection Time: 12/10/23  4:25 PM   Specimen: Anterior Nasal Swab  Result Value Ref Range Status   SARS Coronavirus 2 by RT PCR NEGATIVE NEGATIVE Final    Comment: (NOTE) SARS-CoV-2 target nucleic acids are NOT DETECTED.  The SARS-CoV-2 RNA is generally detectable in upper respiratory specimens during the acute phase of infection. The lowest concentration of SARS-CoV-2 viral copies this assay can detect is 138 copies/mL. A negative result does not preclude SARS-Cov-2 infection and should not be used as the sole basis for treatment or other patient management decisions. A negative result may occur with  improper specimen collection/handling, submission of specimen other than nasopharyngeal swab, presence of viral mutation(s) within the areas targeted by this assay, and inadequate number of viral copies(<138 copies/mL). A negative result must be combined with clinical observations, patient history, and epidemiological information. The expected result is  Negative.  Fact Sheet for Patients:  BloggerCourse.com  Fact Sheet for Healthcare Providers:  SeriousBroker.it  This test is no t yet approved or cleared by the [place]  FDA and  has been authorized for detection and/or diagnosis of SARS-CoV-2 by FDA under an Emergency Use Authorization (EUA). This EUA will remain  in effect (meaning this test can be used) for the duration of the COVID-19 declaration under Section 564(b)(1) of the Act, 21 U.S.C.section 360bbb-3(b)(1), unless the authorization is terminated  or revoked sooner.       Influenza A by PCR NEGATIVE NEGATIVE Final   Influenza B by PCR NEGATIVE NEGATIVE Final    Comment: (NOTE) The Xpert Xpress SARS-CoV-2/FLU/RSV plus assay is intended as an aid in the diagnosis of influenza from Nasopharyngeal swab specimens and should not be used as a sole basis for treatment. Nasal washings and aspirates are unacceptable for Xpert Xpress SARS-CoV-2/FLU/RSV testing.  Fact Sheet for Patients: BloggerCourse.com  Fact Sheet for Healthcare Providers: SeriousBroker.it  This test is not yet approved or cleared by the [place]  FDA and has been authorized for detection and/or diagnosis of SARS-CoV-2 by FDA under an Emergency Use Authorization (EUA). This EUA will remain in effect (meaning this test can be used) for the duration of the COVID-19 declaration under Section 564(b)(1) of the Act, 21 U.S.C. section 360bbb-3(b)(1), unless the authorization is terminated or revoked.     Resp Syncytial Virus by PCR NEGATIVE NEGATIVE Final    Comment: (NOTE) Fact Sheet for Patients: BloggerCourse.com  Fact Sheet for Healthcare Providers: SeriousBroker.it  This test is not yet approved or cleared by the [place]  FDA and has been authorized for detection and/or diagnosis of  SARS-CoV-2 by FDA under an Emergency Use Authorization (EUA). This EUA will remain in effect (meaning this test can be used) for the duration of the COVID-19 declaration under Section 564(b)(1) of the Act, 21 U.S.C. section 360bbb-3(b)(1), unless the authorization is terminated or revoked.  Performed at [facility], [address], [place], [place] [address]   Urine Culture     Status: None   Collection Time: 12/10/23  4:25 PM   Specimen: Urine, Clean Catch  Result Value Ref Range Status   Specimen Description   Final    URINE, CLEAN CATCH Performed at [facility], [address], [place], [place] [address]    Special Requests   Final    NONE Performed at [facility], [address], [place], [place] [address]    Culture   Final    NO GROWTH Performed at [facility],  [address], [place], [place] [address]    Report Status 12/11/2023 FINAL  Final  Blood culture (routine x 2)     Status: None   Collection Time: 12/10/23  8:41 PM   Specimen: BLOOD  Result Value Ref Range Status   Specimen Description   Final    BLOOD BLOOD LEFT HAND [facility] Performed at [facility], [address], [place], [place] [address]    Special Requests   Final    BOTTLES DRAWN AEROBIC AND ANAEROBIC Blood Culture adequate volume Performed at [facility], [address] [place], [place] [address]    Culture   Final    NO GROWTH 5 DAYS Performed at [facility], [address], [place], [place] [address]    Report Status 12/18/2023 FINAL  Final  MRSA Next Gen by PCR, Nasal     Status: None   Collection Time: 12/12/23 10:28 AM   Specimen: Nasal Mucosa; Nasal Swab  Result Value Ref Range Status   MRSA by PCR Next Gen NOT DETECTED NOT DETECTED Final    Comment: (NOTE) The GeneXpert MRSA Assay (FDA approved for NASAL specimens only), is one component of a comprehensive MRSA colonization surveillance program. It is not intended to  diagnose MRSA infection nor to guide or monitor treatment for MRSA infections. Test performance is not FDA approved in patients less than 68 years old. Performed at [facility], [address] [place], [place] [address]   Gastrointestinal Panel by PCR , Stool     Status: None   Collection Time: 12/16/23  9:36 AM   Specimen: Stool  Result Value Ref Range Status   Campylobacter species NOT DETECTED NOT DETECTED Final   Plesimonas shigelloides NOT DETECTED NOT DETECTED Final   Salmonella species NOT DETECTED NOT DETECTED Final   Yersinia enterocolitica NOT DETECTED NOT DETECTED Final   Vibrio species NOT DETECTED NOT DETECTED Final   Vibrio cholerae NOT DETECTED NOT DETECTED Final   Enteroaggregative E coli (EAEC) NOT DETECTED NOT DETECTED Final   Enteropathogenic E coli (EPEC) NOT DETECTED NOT DETECTED Final   Enterotoxigenic E coli (ETEC) NOT DETECTED NOT DETECTED Final   Shiga like toxin producing E coli (STEC) NOT DETECTED NOT DETECTED Final   Shigella/Enteroinvasive E coli (EIEC) NOT DETECTED NOT DETECTED Final   Cryptosporidium NOT DETECTED NOT DETECTED Final   Cyclospora cayetanensis NOT DETECTED NOT DETECTED Final   Entamoeba histolytica NOT DETECTED NOT DETECTED Final   Giardia lamblia NOT DETECTED NOT DETECTED Final   Adenovirus F40/41 NOT DETECTED NOT DETECTED Final   Astrovirus NOT DETECTED NOT DETECTED Final   Norovirus GI/GII NOT DETECTED NOT DETECTED Final   Rotavirus A NOT DETECTED NOT DETECTED Final   Sapovirus (I, II, IV, and V) NOT DETECTED NOT DETECTED Final    Comment: Performed at [facility], [address] [place], [place] [address]         Radiology Studies: No results found.        Scheduled Meds:  Chlorhexidine  Gluconate Cloth  6 each Topical Daily   diphenhydrAMINE   50 mg Oral QHS   escitalopram   5 mg Oral Daily   feeding supplement  237 mL Oral TID BM   folic acid   1 mg Oral Daily   multivitamin with minerals  1  tablet Oral Daily   norethindrone   5 mg Oral TID   sodium chloride  flush  10-40 mL Intracatheter Q12H   thiamine   100 mg Oral Daily  Continuous Infusions:  ampicillin -sulbactam (UNASYN ) IV Stopped (12/20/23 1239)     LOS: 10 days      Devaughn KATHEE Ban, MD [facility]   If 7PM-7AM, please contact night-coverage [email or web address] 12/20/2023, 12:59 PM

## 2023-12-20 NOTE — Progress Notes (Signed)
 Mobility Specialist Progress Note:    12/20/23 1319  Mobility  Activity Ambulated with assistance;Dangled on edge of bed;Stood at bedside;Pivoted/transferred from bed to chair  Level of Assistance Moderate assist, patient does 50-74%  Assistive Device None  Distance Ambulated (ft) 3 ft  Range of Motion/Exercises Active;All extremities  Activity Response Tolerated well  Mobility visit 1 Mobility  Mobility Specialist Start Time (ACUTE ONLY) 1318  Mobility Specialist Stop Time (ACUTE ONLY) 1343  Mobility Specialist Time Calculation (min) (ACUTE ONLY) 25 min   Pt received in bed, requires encouragement for mobility. ModA +2 to stand and pivot with hand-held assist. Tolerated well, pt was wheeled outside for leisure. Returned to room, left supine. Alarm on, all needs met.  Sherrilee Ditty Mobility Specialist Please contact via Special educational needs teacher or  Rehab office at [phone]

## 2023-12-20 NOTE — Plan of Care (Signed)

## 2023-12-21 ENCOUNTER — Ambulatory Visit

## 2023-12-21 ENCOUNTER — Encounter: Payer: Self-pay | Admitting: Internal Medicine

## 2023-12-21 ENCOUNTER — Other Ambulatory Visit: Payer: Self-pay

## 2023-12-21 DIAGNOSIS — R6510 Systemic inflammatory response syndrome (SIRS) of non-infectious origin without acute organ dysfunction: Secondary | ICD-10-CM | POA: Diagnosis not present

## 2023-12-21 LAB — CBC WITH DIFFERENTIAL/PLATELET
Abs Immature Granulocytes: 0.07 K/uL (ref 0.00–0.07)
Basophils Absolute: 0.1 K/uL (ref 0.0–0.1)
Basophils Relative: 1 %
Eosinophils Absolute: 0.3 K/uL (ref 0.0–0.5)
Eosinophils Relative: 3 %
HCT: 26.9 % — ABNORMAL LOW (ref 36.0–46.0)
Hemoglobin: 8.3 g/dL — ABNORMAL LOW (ref 12.0–15.0)
Immature Granulocytes: 1 %
Lymphocytes Relative: 18 %
Lymphs Abs: 2.3 K/uL (ref 0.7–4.0)
MCH: 25.8 pg — ABNORMAL LOW (ref 26.0–34.0)
MCHC: 30.9 g/dL (ref 30.0–36.0)
MCV: 83.5 fL (ref 80.0–100.0)
Monocytes Absolute: 0.8 K/uL (ref 0.1–1.0)
Monocytes Relative: 6 %
Neutro Abs: 9.1 K/uL — ABNORMAL HIGH (ref 1.7–7.7)
Neutrophils Relative %: 71 %
Platelets: 595 K/uL — ABNORMAL HIGH (ref 150–400)
RBC: 3.22 MIL/uL — ABNORMAL LOW (ref 3.87–5.11)
RDW: 18 % — ABNORMAL HIGH (ref 11.5–15.5)
WBC: 12.6 K/uL — ABNORMAL HIGH (ref 4.0–10.5)
nRBC: 0 % (ref 0.0–0.2)

## 2023-12-21 LAB — RAD ONC ARIA SESSION SUMMARY
Course Elapsed Days: 3
Plan Fractions Treated to Date: 2
Plan Prescribed Dose Per Fraction: 3 Gy
Plan Total Fractions Prescribed: 10
Plan Total Prescribed Dose: 30 Gy
Reference Point Dosage Given to Date: 6 Gy
Reference Point Session Dosage Given: 3 Gy
Session Number: 2

## 2023-12-21 LAB — SURGICAL PATHOLOGY

## 2023-12-21 MED ORDER — AMOXICILLIN-POT CLAVULANATE 500-125 MG PO TABS
1.0000 | ORAL_TABLET | Freq: Two times a day (BID) | ORAL | Status: DC
  Administered 2023-12-21 – 2023-12-25 (×8): 1 via ORAL
  Filled 2023-12-21 (×8): qty 1

## 2023-12-21 NOTE — Progress Notes (Signed)
 PROGRESS NOTE   HPI was taken from Dr. Sim: Valerie Leonard is a 68 y.o. female with medical history significant of endometrial cancer not yet on chemotherapy, depressive disorder, history of alcoholism, who was at the cancer center today to get her access for initiating chemotherapy.  She was noted to have fever and concern for sepsis was raised.  She was subsequently sent over to to the ER for evaluation.  She had recent UTI for which she was treated with cefdinir .  The urine culture was nondiagnostic with multiple species.  Today however patient noted to have white count of 26,000.Lactic acid 2.2.  She had a temperature 102.2.  Also hypotensive on arrival with systolic of 70 and diastolic 48.  Patient appears to have severe sepsis.  The source is not clear but suspicion for possible bacteremia.  Patient is therefore being admitted with sepsis of unknown source.  She is confused.  Appears to have acute metabolic encephalopathy from the incident.     Valerie Leonard  FMW:[id number] DOB: [birth year] DOA: 12/10/2023 PCP: Pcp, No    Assessment & Plan:   Principal Problem:   SIRS (systemic inflammatory response syndrome) (HCC) Active Problems:   Chronic alcohol abuse   Severe major depression, single episode, without psychotic features (HCC)   Anemia   Endometrial cancer (HCC)   Endometrial carcinoma (HCC)   Postmenopausal bleeding   Protein-calorie malnutrition, severe   Fever  Assessment and Plan: SIRS:  No obvious source   Blood cxs NGTD. Urine cx shows no growth. CXR was neg. RSV, influenza, & COVID19 are all neg. New diarrhea but has had multiple stool softeners. Ceftiraxone/flagyl  converted to unasyn  by ID. CT/abd pelvis had limited evaluation of uterine mass. Per gyn/onc no pyometra. ID thinks likely translocation of bacteria caused by malignancy. Will convert to augmentin  and plan to d/c on that  Diarrhea: likely 2/2 stool softeners and abx. Discussed with ID 9/14 as no fever or sig  abd pain and as wbc downtrending will hold on c diff eval, treat with loperamide . Gi pathogen panel neg. Improving now   Endometrial cancer with bleeding: stage III. Oncologist is Dr. Melanee. Port placed 9/11. Onco following and recs apprec, gyn onc now also involved. Packing placed in vagina 9/10, cervix is involved. Rad onc involved, first radiation on 9/12, plan for total of 10 treatments. Hopefully radiation helps control bleeding. Chemo planned after course of radiation. Foley placed 9/10 and discontinued 9/13, packing removed by gyn 9/13 and bleeding appears controlled.   ACD: secondary to endometrial bleed. S/p 1 unit of pRBCs transfused so far. S/p IV iron  x 1 as per onco. H&H stable for now despite above bleeding though 8s today. Will monitor. Hgb 8s today, if still there tomorrow will transfuse as likely to have some ongoing blood loss  Hx of alcohol abuse: remote hx. Will continue to monitor     Acute metabolic encephalopathy: resolved  Hypotension: persists. With above bleeding. Relatively asymptomatic.        DVT prophylaxis: SCDs Code Status: full  Family Communication: sister Olam updated telephonically 9/14 (is hcpoa) Disposition Plan: snf, toc consulted  Level of care: Telemetry Medical  Status is: Inpatient Remains inpatient appropriate because: severity of illness, requiring IV abxs, monitoring of bleeding, radiation therapy    Consultants:  ID Onco   Procedures:   Antimicrobials: rocephin , flagyl  > unasyn    Subjective: No sig pain or fevers. Bleeding minimal. One bm this morning  Objective: Vitals:   12/20/23 2021  12/21/23 0420 12/21/23 0746 12/21/23 1556  BP: 115/68 (!) 100/59 101/66 96/68  Pulse: 94 84 92 93  Resp: 18 16 18 18   Temp: 98.6 F (37 C) 98.4 F (36.9 C) 98.2 F (36.8 C) 98.5 F (36.9 C)  TempSrc: Oral Oral    SpO2: 100% 95% 100% 99%  Weight:      Height:        Intake/Output Summary (Last 24 hours) at 12/21/2023 1639 Last data  filed at 12/21/2023 1510 Gross per 24 hour  Intake 756.24 ml  Output 500 ml  Net 256.24 ml   Filed Weights   12/10/23 1517  Weight: 49 kg    Examination:  General exam: chronically ill appearing Respiratory system: clear breath sounds b/l Cardiovascular system: S1 & S2+. No rubs or clicks   Gastrointestinal system: abd is soft, NT  Central nervous system: alert & awake. Moves all extremities  Psychiatry: Judgement and insight appears normal    Data Reviewed: I have personally reviewed following labs and imaging studies  CBC: Recent Labs  Lab 12/17/23 0533 12/18/23 0637 12/19/23 1614 12/20/23 0503 12/21/23 0515  WBC 18.8* 21.5* 19.2* 14.5* 12.6*  NEUTROABS  --   --  16.4* 11.1* 9.1*  HGB 9.2* 9.3* 9.3* 9.1* 8.3*  HCT 29.5* 29.7* 29.3* 29.4* 26.9*  MCV 83.3 83.2 83.2 83.3 83.5  PLT 613* 633* 615* 632* 595*   Basic Metabolic Panel: Recent Labs  Lab 12/15/23 0507 12/16/23 0346  NA 134* 137  K 3.6 3.6  CL 100 104  CO2 23 26  GLUCOSE 90 88  BUN 7* 11  CREATININE 0.38* 0.45  CALCIUM  8.3* 8.2*   GFR: Estimated Creatinine Clearance: 51.5 mL/min (by C-G formula based on SCr of 0.45 mg/dL). Liver Function Tests: No results for input(s): AST, ALT, ALKPHOS, BILITOT, PROT, ALBUMIN in the last 168 hours.  No results for input(s): LIPASE, AMYLASE in the last 168 hours. No results for input(s): AMMONIA in the last 168 hours. Coagulation Profile: No results for input(s): INR, PROTIME in the last 168 hours.  Cardiac Enzymes: No results for input(s): CKTOTAL, CKMB, CKMBINDEX, TROPONINI in the last 168 hours. BNP (last 3 results) No results for input(s): PROBNP in the last 8760 hours. HbA1C: No results for input(s): HGBA1C in the last 72 hours. CBG: No results for input(s): GLUCAP in the last 168 hours. Lipid Profile: No results for input(s): CHOL, HDL, LDLCALC, TRIG, CHOLHDL, LDLDIRECT in the last 72 hours. Thyroid   Function Tests: No results for input(s): TSH, T4TOTAL, FREET4, T3FREE, THYROIDAB in the last 72 hours. Anemia Panel: No results for input(s): VITAMINB12, FOLATE, FERRITIN, TIBC, IRON , RETICCTPCT in the last 72 hours. Sepsis Labs: Recent Labs  Lab 12/15/23 0507  PROCALCITON 1.85    Recent Results (from the past 240 hours)  MRSA Next Gen by PCR, Nasal     Status: None   Collection Time: 12/12/23 10:28 AM   Specimen: Nasal Mucosa; Nasal Swab  Result Value Ref Range Status   MRSA by PCR Next Gen NOT DETECTED NOT DETECTED Final    Comment: (NOTE) The GeneXpert MRSA Assay (FDA approved for NASAL specimens only), is one component of a comprehensive MRSA colonization surveillance program. It is not intended to diagnose MRSA infection nor to guide or monitor treatment for MRSA infections. Test performance is not FDA approved in patients less than 74 years old. Performed at [facility], [address], [place], [place] [address]   Gastrointestinal Panel by PCR , Stool  Status: None   Collection Time: 12/16/23  9:36 AM   Specimen: Stool  Result Value Ref Range Status   Campylobacter species NOT DETECTED NOT DETECTED Final   Plesimonas shigelloides NOT DETECTED NOT DETECTED Final   Salmonella species NOT DETECTED NOT DETECTED Final   Yersinia enterocolitica NOT DETECTED NOT DETECTED Final   Vibrio species NOT DETECTED NOT DETECTED Final   Vibrio cholerae NOT DETECTED NOT DETECTED Final   Enteroaggregative E coli (EAEC) NOT DETECTED NOT DETECTED Final   Enteropathogenic E coli (EPEC) NOT DETECTED NOT DETECTED Final   Enterotoxigenic E coli (ETEC) NOT DETECTED NOT DETECTED Final   Shiga like toxin producing E coli (STEC) NOT DETECTED NOT DETECTED Final   Shigella/Enteroinvasive E coli (EIEC) NOT DETECTED NOT DETECTED Final   Cryptosporidium NOT DETECTED NOT DETECTED Final   Cyclospora cayetanensis NOT DETECTED NOT DETECTED Final   Entamoeba histolytica  NOT DETECTED NOT DETECTED Final   Giardia lamblia NOT DETECTED NOT DETECTED Final   Adenovirus F40/41 NOT DETECTED NOT DETECTED Final   Astrovirus NOT DETECTED NOT DETECTED Final   Norovirus GI/GII NOT DETECTED NOT DETECTED Final   Rotavirus A NOT DETECTED NOT DETECTED Final   Sapovirus (I, II, IV, and V) NOT DETECTED NOT DETECTED Final    Comment: Performed at [facility], [address], [place], [place] [address]         Radiology Studies: No results found.        Scheduled Meds:  Chlorhexidine  Gluconate Cloth  6 each Topical Daily   diphenhydrAMINE   50 mg Oral QHS   escitalopram   5 mg Oral Daily   feeding supplement  237 mL Oral TID BM   folic acid   1 mg Oral Daily   multivitamin with minerals  1 tablet Oral Daily   sodium chloride  flush  10-40 mL Intracatheter Q12H   thiamine   100 mg Oral Daily   Continuous Infusions:  ampicillin -sulbactam (UNASYN ) IV 3 g (12/21/23 1236)     LOS: 11 days      Devaughn KATHEE Ban, MD [facility]   If 7PM-7AM, please contact night-coverage [email or web address] 12/21/2023, 4:39 PM

## 2023-12-21 NOTE — Progress Notes (Signed)
 Mobility Specialist Progress Note:    12/21/23 1409  Mobility  Activity Ambulated with assistance;Pivoted/transferred from chair to bed  Level of Assistance Contact guard assist, steadying assist  Assistive Device Front wheel walker  Distance Ambulated (ft) 4 ft  Range of Motion/Exercises Active;All extremities  Activity Response Tolerated well  Mobility visit 1 Mobility  Mobility Specialist Start Time (ACUTE ONLY) 1358  Mobility Specialist Stop Time (ACUTE ONLY) 1409  Mobility Specialist Time Calculation (min) (ACUTE ONLY) 11 min   Pt received in w/c after radiation, RN in room. Required CGA to stand and ambulate with RW. Tolerated well, pt not agreeable to further mobility d/t fatigue. Left supine, family at bedside. All needs met.  Sherrilee Ditty Mobility Specialist Please contact via Special educational needs teacher or  Rehab office at [phone]

## 2023-12-21 NOTE — TOC CM/SW Note (Addendum)
 30 Day PASRR Note   Patient Details  Name: Valerie Leonard Date of Birth: May 19, 1955   Transition of Care [facility]) CM/SW Contact:    Alfonso Rummer, LCSW Phone [phone] 12/21/2023, 12:05 PM  To Whom It May Concern:  Please be advised that this patient will require a short-term nursing home stay - anticipated 30 days or less for rehabilitation and strengthening.   The plan is for return home.

## 2023-12-21 NOTE — TOC Progression Note (Signed)
 Transition of Care [facility]) - Progression Note    Patient Details  Name: Valerie Leonard MRN: [id number] Date of Birth: 31-Aug-1955  Transition of Care [facility]) CM/SW Contact  Alfonso Rummer, LCSW Phone Number: 12/21/2023, 12:13 PM  Clinical Narrative:    LCSW A. Rummer completed chart review. 30 day pasrr note completed will submited requested documents fl2, progress notes and h&P for pasrr review. Pt accepted bed at [facility].    Expected Discharge Plan: Skilled Nursing Facility Barriers to Discharge: Continued Medical Work up               Expected Discharge Plan and Services In-house Referral: Clinical Social Work Discharge Planning Services: CM Consult Post Acute Care Choice: Skilled Nursing Facility Living arrangements for the past 2 months: Single Family Home                 DME Arranged: N/A DME Agency: NA       HH Arranged: NA HH Agency: NA         Social Drivers of Health (SDOH) Interventions SDOH Screenings   Food Insecurity: No Food Insecurity (12/10/2023)  Recent Concern: Food Insecurity - Food Insecurity Present (09/27/2023)  Housing: Low Risk  (12/10/2023)  Transportation Needs: No Transportation Needs (12/10/2023)  Recent Concern: Transportation Needs - Unmet Transportation Needs (09/27/2023)  Utilities: Not At Risk (12/10/2023)  Depression (PHQ2-9): Low Risk  (12/10/2023)  Financial Resource Strain: Low Risk  (10/13/2023)   Received from [facility]  Social Connections: Unknown (12/11/2023)  Recent Concern: Social Connections - Socially Isolated (09/28/2023)  Tobacco Use: Medium Risk (12/11/2023)    Readmission Risk Interventions     No data to display

## 2023-12-21 NOTE — Progress Notes (Addendum)
 Gynecologic Oncology Inpatient Note   Chief Complaint: sepsis, bleeding due to gynecologic malignancy  Subjective:  Valerie Leonard is a 68 y.o. female currently admitted to hospital for sepsis.  She has been up with PT today, walking with walker. No vaginal bleeding that she's aware of. No fevers. Has some intermittent nausea but no vomiting. Denies other complaints.   Problem List: Patient Active Problem List   Diagnosis Date Noted   Fever 12/17/2023   SIRS (systemic inflammatory response syndrome) (HCC) 12/16/2023   Postmenopausal bleeding 12/16/2023   Protein-calorie malnutrition, severe 12/16/2023   Endometrial carcinoma (HCC) 12/14/2023   HFrEF (heart failure with reduced ejection fraction) (HCC) 12/03/2023   Endometrial cancer (HCC) 11/11/2023   Anemia 11/04/2023   ABLA (acute blood loss anemia) 09/28/2023   Pressure injury of skin 09/28/2023   Vaginal bleeding 09/27/2023   Severe major depression, single episode, without psychotic features (HCC) 07/26/2020   Depression    Acute delirium 07/25/2020   Acute metabolic encephalopathy    Hypomagnesemia    Chronic alcohol abuse    Hypokalemia 07/24/2020    Past Medical History: Past Medical History:  Diagnosis Date   Acute delirium    Acute metabolic encephalopathy    Anemia    blood loss anemia   Chronic alcohol abuse    Depression    Endometrial cancer (HCC)    Hypokalemia    Hypomagnesemia    Pressure injury of skin    Severe major depression, single episode, without psychotic features (HCC)    Vaginal bleeding     Past Surgical History: Past Surgical History:  Procedure Laterality Date   IR IMAGING GUIDED PORT INSERTION  12/17/2023   IR PATIENT EVAL TECH 0-60 MINS  12/10/2023   NO PAST SURGERIES      Past Gynecologic History:  See prior note  OB History:  OB History  No obstetric history on file.    Family History: History reviewed. No pertinent family history.  Social History: Social History    Socioeconomic History   Marital status: Single    Spouse name: Not on file   Number of children: 0   Years of education: Not on file   Highest education level: Not on file  Occupational History   Not on file  Tobacco Use   Smoking status: Former    Types: Cigarettes, Cigars   Smokeless tobacco: Never  Vaping Use   Vaping status: Never Used  Substance and Sexual Activity   Alcohol use: Yes    Alcohol/week: 3.0 standard drinks of alcohol    Types: 3 Cans of beer per week    Comment: More than 4 glasses of wine, mixed drinks, beer daily.    Drug use: Not Currently    Comment: former use of marijuana   Sexual activity: Not Currently    Birth control/protection: None  Other Topics Concern   Not on file  Social History Narrative   Lives by herself    Social Drivers of Health   Financial Resource Strain: Low Risk  (10/13/2023)   Received from [facility]   Overall Financial Resource Strain (CARDIA)    Difficulty of Paying Living Expenses: Not very hard  Food Insecurity: No Food Insecurity (12/10/2023)   Hunger Vital Sign    Worried About Running Out of Food in the Last Year: Never true    Ran Out of Food in the Last Year: Never true  Recent Concern: Food Insecurity - Food Insecurity Present (09/27/2023)  Hunger Vital Sign    Worried About Running Out of Food in the Last Year: Sometimes true    Ran Out of Food in the Last Year: Sometimes true  Transportation Needs: No Transportation Needs (12/10/2023)   PRAPARE - Administrator, Civil Service (Medical): No    Lack of Transportation (Non-Medical): No  Recent Concern: Transportation Needs - Unmet Transportation Needs (09/27/2023)   PRAPARE - Administrator, Civil Service (Medical): Yes    Lack of Transportation (Non-Medical): Yes  Physical Activity: Not on file  Stress: Not on file  Social Connections: Unknown (12/11/2023)   Social Connection and Isolation Panel    Frequency of  Communication with Friends and Family: More than three times a week    Frequency of Social Gatherings with Friends and Family: More than three times a week    Attends Religious Services: Patient declined    Database administrator or Organizations: Patient declined    Attends Banker Meetings: Patient declined    Marital Status: Widowed  Recent Concern: Social Connections - Socially Isolated (09/28/2023)   Social Connection and Isolation Panel    Frequency of Communication with Friends and Family: Never    Frequency of Social Gatherings with Friends and Family: Never    Attends Religious Services: Never    Database administrator or Organizations: No    Attends Banker Meetings: Never    Marital Status: Widowed  Intimate Partner Violence: Not At Risk (12/10/2023)   Humiliation, Afraid, Rape, and Kick questionnaire    Fear of Current or Ex-Partner: No    Emotionally Abused: No    Physically Abused: No    Sexually Abused: No    Allergies: No Known Allergies  Current Medications: Current Facility-Administered Medications  Medication Dose Route Frequency Provider Last Rate Last Admin   acetaminophen  (TYLENOL ) tablet 650 mg  650 mg Oral Q6H PRN Sim Emery CROME, MD   650 mg at 12/21/23 0053   Or   acetaminophen  (TYLENOL ) suppository 650 mg  650 mg Rectal Q6H PRN Sim Emery CROME, MD   650 mg at 12/17/23 0751   ALPRAZolam  (XANAX ) tablet 0.25 mg  0.25 mg Oral BID PRN Trudy Anthony HERO, MD   0.25 mg at 12/20/23 2133   Ampicillin -Sulbactam (UNASYN ) 3 g in sodium chloride  0.9 % 100 mL IVPB  3 g Intravenous Q6H Ravishankar, Donald, MD 200 mL/hr at 12/21/23 0511 3 g at 12/21/23 0511   Chlorhexidine  Gluconate Cloth 2 % PADS 6 each  6 each Topical Daily Dasie Tinnie MATSU, NP   6 each at 12/21/23 1003   diphenhydrAMINE  (BENADRYL ) capsule 50 mg  50 mg Oral QHS Trudy Anthony HERO, MD   50 mg at 12/20/23 2049   escitalopram  (LEXAPRO ) tablet 5 mg  5 mg Oral Daily Trudy Anthony HERO, MD   5 mg at 12/21/23 0959   feeding supplement (ENSURE PLUS HIGH PROTEIN) liquid 237 mL  237 mL Oral TID BM Wouk, Devaughn Sayres, MD   237 mL at 12/21/23 1001   folic acid  (FOLVITE ) tablet 1 mg  1 mg Oral Daily Kandis Devaughn Sayres, MD   1 mg at 12/21/23 9040   loperamide  (IMODIUM ) capsule 2 mg  2 mg Oral Q4H PRN Kandis Devaughn Sayres, MD   2 mg at 12/20/23 2132   multivitamin with minerals tablet 1 tablet  1 tablet Oral Daily Kandis Devaughn Sayres, MD   1 tablet at 12/21/23 [phone]  norethindrone  (AYGESTIN ) tablet 5 mg  5 mg Oral TID Kandis Devaughn Sayres, MD   5 mg at 12/21/23 9040   ondansetron  (ZOFRAN ) tablet 4 mg  4 mg Oral Q6H PRN Sim Emery CROME, MD       Or   ondansetron  (ZOFRAN ) injection 4 mg  4 mg Intravenous Q6H PRN Sim Emery CROME, MD   4 mg at 12/21/23 0059   sodium chloride  flush (NS) 0.9 % injection 10-40 mL  10-40 mL Intracatheter Q12H Wouk, Devaughn Sayres, MD   10 mL at 12/21/23 1002   sodium chloride  flush (NS) 0.9 % injection 10-40 mL  10-40 mL Intracatheter PRN Wouk, Devaughn Sayres, MD       thiamine  (VITAMIN B1) tablet 100 mg  100 mg Oral Daily Kandis Devaughn Sayres, MD   100 mg at 12/21/23 1006   Review of Systems General:  no complaints Skin: no complaints Eyes: no complaints HEENT: no complaints Breasts: no complaints Pulmonary: no complaints Cardiac: no complaints Gastrointestinal: no complaints Genitourinary/Sexual: no complaints Ob/Gyn: no complaints Musculoskeletal: no complaints Hematology: no complaints Neurologic/Psych: no complaints   Objective:  Physical Examination:  BP 101/66 (BP Location: Right Arm)   Pulse 92   Temp 98.2 F (36.8 C)   Resp 18   Ht 5' 1 (1.549 m)   Wt 108 lb (49 kg)   SpO2 100%   BMI 20.41 kg/m     General appearance: alert, cooperative Cardiovascular: regular rate and rhythm Respiratory: no audible coughing or wheezing Abdomen: nontender. Not distended. Bowel sounds present.  Soft bowel movement. Peri care provided.  Skin  exam - normal coloration and turgor, no rashes. Pressure ulcer Neurological exam reveals alert, oriented, normal speech, no focal findings or movement disorder noted. Pelvic: no evidence of bleeding on pad. Internal exam deferred.      Assessment:  Valerie Leonard is a 68 y.o. 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  Pulmonary nodules are not definitive.  She now has obvious cervical involvement. Biopsy pending.  She was symptomatic with heavy bleeding after exam yesterday and required vaginal packing to control bleeding and hemorrhage.  Started radiation on 12/18/23. Tolerated well. Packing removed 12/18/13. No evidence of re-bleeding.    Leukocytosis- wbc 12.6 today. Afebrile. Thought to be secondary to malignancy.   Hmg 8.3. Iron  deficiency due to chronic bleeding due to malignancy. S/p 1 unit pRBCs on 12/11/23.   Pressure ulcer- followed by dietician & nursing. Offloading.   Port-a-cath- functioning appropriately  Plan:   Bleeding due to malignancy- active malignancy of lower uterine segment, cervix, and upper vagina on exam. Pelvic exam complicated by hemorrhage s/p vaginal packing. Removed. No evidence of re-bleeding. Plan is to continue palliative radiation for bleeding control with 30 gray x 10 fractions. Nursing will continue to monitor for bleeding. If heavy or hemorrhage, plan to replace packing and foley to provide tamponade against the bleeding tumor site. Acceptable for discharge from med-onc & gynonc perspective after her radiation today.   Anemia- due to blood loss. Hmg has dropped to 8.3. Recommend transfusion of 1 unit of pRBCs prior to discharge and we can discontinue daily labs now that wbcs have normalized.   Severe sepsis & Leukocytosis- etiology unclear. On admission she had hypotension, tachycardia, and fever. Now resolved. Workup as far as etiology goes has been unclear. No evidence of  pyometra. Unable to perform culture on specimen that was sent for pathology.  wbcs have normalized. Would be ok with discontinuing iv antibiotics and plan to discharge on oral antibiotics.   Port-a-cath: placed during admission.   Tinnie KANDICE Dawn, NP

## 2023-12-21 NOTE — Progress Notes (Signed)
 Physical Therapy Treatment Patient Details Name: JANIT CUTTER MRN: [id number] DOB: Dec 17, 1955 Today's Date: 12/21/2023   History of Present Illness Patient is a 68 year old female with sepsis. PMH: endometrial cancer, alcohol abuse, acute metabolic encephalopathy    PT Comments  Pt A&Ox4, agreeable to PT tx. Pt cited soreness in her back during session, no specific number rating provided. Pt was met sidelying in bed, able to complete bed mobility with supervision and use of bed features. STS from SOB with minA for RW stabilization, cues for hand placement due to pt tendency to pull to stand from RW. Pt amb ~37ft with RW and CGA, very slowed cadence and noted to be amb on toes initially that improved with VC for foot flat in stance. Pt required verbal and visual cues throughout amb to redirect to task and to attend to bilateral foot placement due to tendency for adducted gait pattern. Pt was returned supine in bed at end of session, all needs in reach. The patient would benefit from further skilled PT intervention to continue to progress towards goals.     If plan is discharge home, recommend the following: A little help with walking and/or transfers;A lot of help with bathing/dressing/bathroom;Assistance with cooking/housework;Assist for transportation;Help with stairs or ramp for entrance   Can travel by private vehicle     No  Equipment Recommendations  None recommended by PT    Recommendations for Other Services       Precautions / Restrictions Precautions Precautions: Fall Recall of Precautions/Restrictions: Impaired Restrictions Weight Bearing Restrictions Per Provider Order: No     Mobility  Bed Mobility Overal bed mobility: Needs Assistance Bed Mobility: Supine to Sit     Supine to sit: Supervision, Used rails Sit to supine: Supervision, Used rails   General bed mobility comments: no physical assistance, increased time/effort    Transfers Overall transfer level:  Needs assistance Equipment used: Rolling walker (2 wheels) Transfers: Sit to/from Stand Sit to Stand: Min assist           General transfer comment: minA to achieve standing and for RW stabilization. VC for hand placement    Ambulation/Gait Ambulation/Gait assistance: Contact guard assist Gait Distance (Feet): 80 Feet Assistive device: Rolling walker (2 wheels) Gait Pattern/deviations: Step-through pattern, Narrow base of support Gait velocity: very decreased     General Gait Details: pt has tendency to amb on toes, heavy UE use on RW to offload BLE. Narrow BOS with occasional scissoring- Visual cues for pt to attend to foot positioning throughout   Stairs             Wheelchair Mobility     Tilt Bed    Modified Rankin (Stroke Patients Only)       Balance Overall balance assessment: Needs assistance Sitting-balance support: Feet supported Sitting balance-Leahy Scale: Good Sitting balance - Comments: steady static and dynamic sitting   Standing balance support: Bilateral upper extremity supported, Reliant on assistive device for balance Standing balance-Leahy Scale: Fair Standing balance comment: heavy BUE support on RW                            Communication Communication Communication: No apparent difficulties  Cognition Arousal: Alert Behavior During Therapy: WFL for tasks assessed/performed   PT - Cognitive impairments: No apparent impairments                       PT - Cognition  Comments: A&Ox4, easily distracted, requires frequent cues to redirect to task Following commands: Impaired Following commands impaired: Follows multi-step commands with increased time    Cueing Cueing Techniques: Verbal cues, Visual cues  Exercises      General Comments        Pertinent Vitals/Pain Pain Assessment Pain Assessment: Faces Faces Pain Scale: Hurts little more Pain Location: back Pain Descriptors / Indicators: Aching,  Grimacing Pain Intervention(s): Limited activity within patient's tolerance, Monitored during session, Repositioned    Home Living                          Prior Function            PT Goals (current goals can now be found in the care plan section) Progress towards PT goals: Progressing toward goals    Frequency    Min 2X/week      PT Plan      Co-evaluation              AM-PAC PT 6 Clicks Mobility   Outcome Measure  Help needed turning from your back to your side while in a flat bed without using bedrails?: A Little Help needed moving from lying on your back to sitting on the side of a flat bed without using bedrails?: A Little Help needed moving to and from a bed to a chair (including a wheelchair)?: A Little Help needed standing up from a chair using your arms (e.g., wheelchair or bedside chair)?: A Little Help needed to walk in hospital room?: A Little Help needed climbing 3-5 steps with a railing? : Total 6 Click Score: 16    End of Session Equipment Utilized During Treatment: Gait belt Activity Tolerance: Patient tolerated treatment well Patient left: in bed;with call bell/phone within reach;with bed alarm set Nurse Communication: Mobility status PT Visit Diagnosis: Muscle weakness (generalized) (M62.81);Unsteadiness on feet (R26.81)     Time: 8871-8850 PT Time Calculation (min) (ACUTE ONLY): 21 min  Charges:    $Therapeutic Activity: 8-22 mins PT General Charges $$ ACUTE PT VISIT: 1 Visit                     Harvir Patry, SPT

## 2023-12-21 NOTE — Progress Notes (Signed)
 Date of Admission:  12/10/2023      ID: Valerie Leonard is a 68 y.o. female Principal Problem:   SIRS (systemic inflammatory response syndrome) (HCC) Active Problems:   Chronic alcohol abuse   Severe major depression, single episode, without psychotic features (HCC)   Anemia   Endometrial cancer (HCC)   Endometrial carcinoma (HCC)   Postmenopausal bleeding   Protein-calorie malnutrition, severe   Fever   Subjective: She is doing better Vaginal pack present since yesterday    Medications:   amoxicillin -clavulanate  1 tablet Oral BID   Chlorhexidine  Gluconate Cloth  6 each Topical Daily   diphenhydrAMINE   50 mg Oral QHS   escitalopram   5 mg Oral Daily   feeding supplement  237 mL Oral TID BM   folic acid   1 mg Oral Daily   multivitamin with minerals  1 tablet Oral Daily   sodium chloride  flush  10-40 mL Intracatheter Q12H   thiamine   100 mg Oral Daily    Objective: Vital signs in last 24 hours: Patient Vitals for the past 24 hrs:  BP Temp Temp src Pulse Resp SpO2  12/21/23 1556 96/68 98.5 F (36.9 C) -- 93 18 99 %  12/21/23 0746 101/66 98.2 F (36.8 C) -- 92 18 100 %  12/21/23 0420 (!) 100/59 98.4 F (36.9 C) Oral 84 16 95 %  12/20/23 2021 115/68 98.6 F (37 C) Oral 94 18 100 %       PHYSICAL EXAM:  General: Alert, cooperative, no distress, appears stated age. Port in place Lungs: Clear to auscultation bilaterally. No Wheezing or Rhonchi. No rales. Heart: Regular rate and rhythm, no murmur, rub or gallop. Abdomen: Soft, non-tender,not distended. Bowel sounds normal. No masses Extremities: atraumatic, no cyanosis. No edema. No clubbing Skin: No rashes or lesions. Or bruising Lymph: Cervical, supraclavicular normal. Neurologic: Grossly non-focal Foley  Lab Results    Latest Ref Rng & Units 12/21/2023    5:15 AM 12/20/2023    5:03 AM 12/19/2023    4:14 PM  CBC  WBC 4.0 - 10.5 K/uL 12.6  14.5  19.2   Hemoglobin 12.0 - 15.0 g/dL 8.3  9.1  9.3   Hematocrit  36.0 - 46.0 % 26.9  29.4  29.3   Platelets 150 - 400 K/uL 595  632  615        Latest Ref Rng & Units 12/16/2023    3:46 AM 12/15/2023    5:07 AM 12/14/2023    5:02 AM  CMP  Glucose 70 - 99 mg/dL 88  90  893   BUN 8 - 23 mg/dL 11  7  7    Creatinine 0.44 - 1.00 mg/dL 9.54  9.61  9.61   Sodium 135 - 145 mmol/L 137  134  134   Potassium 3.5 - 5.1 mmol/L 3.6  3.6  4.1   Chloride 98 - 111 mmol/L 104  100  98   CO2 22 - 32 mmol/L 26  23  25    Calcium  8.9 - 10.3 mg/dL 8.2  8.3  8.7       Microbiology: Blood culture no growth    Assessment/Plan: Endometrial carcinoma with severe vaginal bleeding Awaiting chemo therapy and radiation Underwent speculum examination on 9/10 and the tumor was extending into the cervical os. There was profuse bleeding Patient now has a Foley catheter and vaginal packing Watch closely for any streptococcal infection  Fever and leucocytosis-  The source is likely the uterus.  Necrotic tumor  in anaerobic milieu is at risk for infection.  Anaerobes, Clostridium perfringens, streptococci are common organisms But the blood culture has been negative Patient has been on appropriate antibiotics and now on Unasyn  Can change to PO augmentin  soon I discussed with her oncologist and she would like to keep her on suppressive antibiotic therapy as she goes through her chemo Patient will be getting palliative radiation  Thrombocytosis.  Reactive to the bleeding and the anemia  Anemia secondary to vaginal bleed.  Has received PRBC  Discussed the management with the patient and the oncologist.  Discussed with the hospitalist.  ID will no see her this weekend- On call ID available by phone for urgent issues

## 2023-12-21 NOTE — Plan of Care (Signed)
  Problem: Health Behavior/Discharge Planning: Goal: Ability to manage health-related needs will improve Outcome: Progressing   Problem: Activity: Goal: Risk for activity intolerance will decrease Outcome: Progressing   Problem: Coping: Goal: Level of anxiety will decrease Outcome: Progressing   Problem: Pain Managment: Goal: General experience of comfort will improve and/or be controlled Outcome: Progressing

## 2023-12-22 ENCOUNTER — Other Ambulatory Visit: Payer: Self-pay

## 2023-12-22 ENCOUNTER — Ambulatory Visit

## 2023-12-22 DIAGNOSIS — D72829 Elevated white blood cell count, unspecified: Secondary | ICD-10-CM

## 2023-12-22 DIAGNOSIS — R6510 Systemic inflammatory response syndrome (SIRS) of non-infectious origin without acute organ dysfunction: Secondary | ICD-10-CM | POA: Diagnosis not present

## 2023-12-22 LAB — CBC
HCT: 26.4 % — ABNORMAL LOW (ref 36.0–46.0)
Hemoglobin: 8.2 g/dL — ABNORMAL LOW (ref 12.0–15.0)
MCH: 25.8 pg — ABNORMAL LOW (ref 26.0–34.0)
MCHC: 31.1 g/dL (ref 30.0–36.0)
MCV: 83 fL (ref 80.0–100.0)
Platelets: 586 K/uL — ABNORMAL HIGH (ref 150–400)
RBC: 3.18 MIL/uL — ABNORMAL LOW (ref 3.87–5.11)
RDW: 17.9 % — ABNORMAL HIGH (ref 11.5–15.5)
WBC: 12.1 K/uL — ABNORMAL HIGH (ref 4.0–10.5)
nRBC: 0 % (ref 0.0–0.2)

## 2023-12-22 LAB — RAD ONC ARIA SESSION SUMMARY
Course Elapsed Days: 4
Plan Fractions Treated to Date: 3
Plan Prescribed Dose Per Fraction: 3 Gy
Plan Total Fractions Prescribed: 10
Plan Total Prescribed Dose: 30 Gy
Reference Point Dosage Given to Date: 9 Gy
Reference Point Session Dosage Given: 3 Gy
Session Number: 3

## 2023-12-22 NOTE — Progress Notes (Signed)
 PROGRESS NOTE   HPI was taken from Dr. Sim: Valerie Leonard is a 68 y.o. female with medical history significant of endometrial cancer not yet on chemotherapy, depressive disorder, history of alcoholism, who was at the cancer center today to get her access for initiating chemotherapy.  She was noted to have fever and concern for sepsis was raised.  She was subsequently sent over to to the ER for evaluation.  She had recent UTI for which she was treated with cefdinir .  The urine culture was nondiagnostic with multiple species.  Today however patient noted to have white count of 26,000.Lactic acid 2.2.  She had a temperature 102.2.  Also hypotensive on arrival with systolic of 70 and diastolic 48.  Patient appears to have severe sepsis.  The source is not clear but suspicion for possible bacteremia.  Patient is therefore being admitted with sepsis of unknown source.  She is confused.  Appears to have acute metabolic encephalopathy from the incident.     Valerie Leonard  FMW:[id number] DOB: [birth year] DOA: 12/10/2023 PCP: Pcp, No    Assessment & Plan:   Principal Problem:   SIRS (systemic inflammatory response syndrome) (HCC) Active Problems:   Chronic alcohol abuse   Severe major depression, single episode, without psychotic features (HCC)   Anemia   Endometrial cancer (HCC)   Endometrial carcinoma (HCC)   Postmenopausal bleeding   Protein-calorie malnutrition, severe   Fever  Assessment and Plan:  SIRS:  No obvious source   Blood cxs NGTD. Urine cx shows no growth. CXR was neg. RSV, influenza, & COVID19 are all neg. New diarrhea but has had multiple stool softeners. Ceftiraxone/flagyl  converted to unasyn  by ID. CT/abd pelvis had limited evaluation of uterine mass. Per gyn/onc no pyometra. ID thinks likely translocation of bacteria caused by malignancy. have converted to augmentin  and plan to d/c on that  Diarrhea: likely 2/2 stool softeners and abx. Discussed with ID 9/14 as no fever or  sig abd pain and as wbc downtrending will hold on c diff eval, treat with loperamide . Gi pathogen panel neg. Improving now   Endometrial cancer with bleeding: stage III. Oncologist is Dr. Melanee. Port placed 9/11. Onco following and recs apprec, gyn onc now also involved. Packing placed in vagina 9/10, cervix is involved. Rad onc involved, first radiation on 9/12, plan for total of 10 treatments. Hopefully radiation helps control bleeding. Chemo planned after course of radiation. Foley placed 9/10 and discontinued 9/13, packing removed by gyn 9/13 and bleeding appears controlled.   ACD: secondary to endometrial bleed. S/p 1 unit of pRBCs transfused so far. S/p IV iron  x 1 as per onco. H&H stable for now despite above bleeding though 8s today. Will monitor. Hgb 8s today and stable, patient reports no bleeding, holding on transfusion.  Hx of alcohol abuse: remote hx. Will continue to monitor     Acute metabolic encephalopathy: resolved though appears to have some degree of baseline cognitive impairment  Hypotension: persists. With above bleeding. Relatively asymptomatic.      DVT prophylaxis: SCDs Code Status: full  Family Communication: sister Olam updated telephonically 9/16 (is hcpoa) Disposition Plan: snf, toc consulted  Level of care: Telemetry Medical  Status is: Inpatient Remains inpatient appropriate because: pending snf placement    Consultants:  ID Onco   Procedures:   Antimicrobials: rocephin , flagyl  > unasyn    Subjective: No sig pain or fevers. Bleeding none.    Objective: Vitals:   12/21/23 1556 12/21/23 2051 12/22/23 0420 12/22/23 9247  BP: 96/68 101/60 101/63 107/71  Pulse: 93 92 89 89  Resp: 18 15 16 16   Temp: 98.5 F (36.9 C) 98.8 F (37.1 C) 98.8 F (37.1 C) 98.1 F (36.7 C)  TempSrc:  Oral Oral   SpO2: 99% 99% 98% 100%  Weight:      Height:        Intake/Output Summary (Last 24 hours) at 12/22/2023 1410 Last data filed at 12/22/2023 0930 Gross per  24 hour  Intake 970.89 ml  Output --  Net 970.89 ml   Filed Weights   12/10/23 1517  Weight: 49 kg    Examination:  General exam: chronically ill appearing Respiratory system: clear breath sounds b/l Cardiovascular system: S1 & S2+. No rubs or clicks   Gastrointestinal system: abd is soft, NT  Central nervous system: alert & awake. Moves all extremities  Psychiatry: Judgement and insight appears normal    Data Reviewed: I have personally reviewed following labs and imaging studies  CBC: Recent Labs  Lab 12/18/23 0637 12/19/23 1614 12/20/23 0503 12/21/23 0515 12/22/23 0540  WBC 21.5* 19.2* 14.5* 12.6* 12.1*  NEUTROABS  --  16.4* 11.1* 9.1*  --   HGB 9.3* 9.3* 9.1* 8.3* 8.2*  HCT 29.7* 29.3* 29.4* 26.9* 26.4*  MCV 83.2 83.2 83.3 83.5 83.0  PLT 633* 615* 632* 595* 586*   Basic Metabolic Panel: Recent Labs  Lab 12/16/23 0346  NA 137  K 3.6  CL 104  CO2 26  GLUCOSE 88  BUN 11  CREATININE 0.45  CALCIUM  8.2*   GFR: Estimated Creatinine Clearance: 51.5 mL/min (by C-G formula based on SCr of 0.45 mg/dL). Liver Function Tests: No results for input(s): AST, ALT, ALKPHOS, BILITOT, PROT, ALBUMIN in the last 168 hours.  No results for input(s): LIPASE, AMYLASE in the last 168 hours. No results for input(s): AMMONIA in the last 168 hours. Coagulation Profile: No results for input(s): INR, PROTIME in the last 168 hours.  Cardiac Enzymes: No results for input(s): CKTOTAL, CKMB, CKMBINDEX, TROPONINI in the last 168 hours. BNP (last 3 results) No results for input(s): PROBNP in the last 8760 hours. HbA1C: No results for input(s): HGBA1C in the last 72 hours. CBG: No results for input(s): GLUCAP in the last 168 hours. Lipid Profile: No results for input(s): CHOL, HDL, LDLCALC, TRIG, CHOLHDL, LDLDIRECT in the last 72 hours. Thyroid  Function Tests: No results for input(s): TSH, T4TOTAL, FREET4, T3FREE,  THYROIDAB in the last 72 hours. Anemia Panel: No results for input(s): VITAMINB12, FOLATE, FERRITIN, TIBC, IRON , RETICCTPCT in the last 72 hours. Sepsis Labs: No results for input(s): PROCALCITON, LATICACIDVEN in the last 168 hours.   Recent Results (from the past 240 hours)  Gastrointestinal Panel by PCR , Stool     Status: None   Collection Time: 12/16/23  9:36 AM   Specimen: Stool  Result Value Ref Range Status   Campylobacter species NOT DETECTED NOT DETECTED Final   Plesimonas shigelloides NOT DETECTED NOT DETECTED Final   Salmonella species NOT DETECTED NOT DETECTED Final   Yersinia enterocolitica NOT DETECTED NOT DETECTED Final   Vibrio species NOT DETECTED NOT DETECTED Final   Vibrio cholerae NOT DETECTED NOT DETECTED Final   Enteroaggregative E coli (EAEC) NOT DETECTED NOT DETECTED Final   Enteropathogenic E coli (EPEC) NOT DETECTED NOT DETECTED Final   Enterotoxigenic E coli (ETEC) NOT DETECTED NOT DETECTED Final   Shiga like toxin producing E coli (STEC) NOT DETECTED NOT DETECTED Final   Shigella/Enteroinvasive E coli (EIEC) NOT  DETECTED NOT DETECTED Final   Cryptosporidium NOT DETECTED NOT DETECTED Final   Cyclospora cayetanensis NOT DETECTED NOT DETECTED Final   Entamoeba histolytica NOT DETECTED NOT DETECTED Final   Giardia lamblia NOT DETECTED NOT DETECTED Final   Adenovirus F40/41 NOT DETECTED NOT DETECTED Final   Astrovirus NOT DETECTED NOT DETECTED Final   Norovirus GI/GII NOT DETECTED NOT DETECTED Final   Rotavirus A NOT DETECTED NOT DETECTED Final   Sapovirus (I, II, IV, and V) NOT DETECTED NOT DETECTED Final    Comment: Performed at [facility], [address] [place], [place] [address]         Radiology Studies: No results found.        Scheduled Meds:  amoxicillin -clavulanate  1 tablet Oral BID   Chlorhexidine  Gluconate Cloth  6 each Topical Daily   diphenhydrAMINE   50 mg Oral QHS   escitalopram   5 mg Oral Daily    feeding supplement  237 mL Oral TID BM   folic acid   1 mg Oral Daily   multivitamin with minerals  1 tablet Oral Daily   sodium chloride  flush  10-40 mL Intracatheter Q12H   Continuous Infusions:     LOS: 12 days      Devaughn KATHEE Ban, MD [facility]   If 7PM-7AM, please contact night-coverage [email or web address] 12/22/2023, 2:10 PM

## 2023-12-22 NOTE — TOC Progression Note (Signed)
 Transition of Care [facility]) - Progression Note    Patient Details  Name: Valerie Leonard MRN: [id number] Date of Birth: 1956/04/04  [facility] CM/SW Contact  Dalia GORMAN Fuse, RN Phone Number: 12/22/2023, 3:55 PM  Clinical Narrative:    TOC checked Stoy Must, Additional documents didn't upload on 9/15. TOC reattached requested documents.   Expected Discharge Plan: Skilled Nursing Facility Barriers to Discharge: Continued Medical Work up               Expected Discharge Plan and Services In-house Referral: Clinical Social Work Discharge Planning Services: CM Consult Post Acute Care Choice: Skilled Nursing Facility Living arrangements for the past 2 months: Single Family Home                 DME Arranged: N/A DME Agency: NA       HH Arranged: NA HH Agency: NA         Social Drivers of Health (SDOH) Interventions SDOH Screenings   Food Insecurity: No Food Insecurity (12/10/2023)  Recent Concern: Food Insecurity - Food Insecurity Present (09/27/2023)  Housing: Low Risk  (12/10/2023)  Transportation Needs: No Transportation Needs (12/10/2023)  Recent Concern: Transportation Needs - Unmet Transportation Needs (09/27/2023)  Utilities: Not At Risk (12/10/2023)  Depression (PHQ2-9): Low Risk  (12/10/2023)  Financial Resource Strain: Low Risk  (10/13/2023)   Received from [facility]  Social Connections: Unknown (12/11/2023)  Recent Concern: Social Connections - Socially Isolated (09/28/2023)  Tobacco Use: Medium Risk (12/11/2023)    Readmission Risk Interventions     No data to display

## 2023-12-22 NOTE — Progress Notes (Signed)
 Mobility Specialist Progress Note:    12/22/23 1519  Mobility  Activity Ambulated with assistance;Pivoted/transferred from bed to chair;Pivoted/transferred from chair to bed  Level of Assistance Moderate assist, patient does 50-74%  Assistive Device  (HHA)  Distance Ambulated (ft) 12 ft  Range of Motion/Exercises Active;All extremities  Activity Response Tolerated well  Mobility visit 1 Mobility  Mobility Specialist Start Time (ACUTE ONLY) 1438  Mobility Specialist Stop Time (ACUTE ONLY) 1514  Mobility Specialist Time Calculation (min) (ACUTE ONLY) 36 min   Pt feeling weak today, ambulated with ModA and held hand assist. Wheeled outside for leisure. Returned to room, sister at bedside. All needs met.  Sherrilee Ditty Mobility Specialist Please contact via Special educational needs teacher or  Rehab office at [phone]

## 2023-12-22 NOTE — Plan of Care (Signed)

## 2023-12-22 NOTE — Progress Notes (Signed)
 Nutrition Follow-up  DOCUMENTATION CODES:   Severe malnutrition in context of chronic illness  INTERVENTION:   -Continue regular diet -Continue Ensure Plus High Protein po TID, each supplement provides 350 kcal and 20 grams of protein  -Continue MVI with minerals daily -Continue 1 mg folic acid  daily  NUTRITION DIAGNOSIS:   Severe Malnutrition related to chronic illness as evidenced by moderate fat depletion, severe fat depletion, moderate muscle depletion, severe muscle depletion, percent weight loss.  Ongoing  GOAL:   Patient will meet greater than or equal to 90% of their needs  Progressing   MONITOR:   PO intake, Supplement acceptance  REASON FOR ASSESSMENT:   Malnutrition Screening Tool    ASSESSMENT:   Pt with medical history significant of endometrial cancer not yet on chemotherapy, depressive disorder, history of alcoholism, who was at the cancer center today to get her access for initiating chemotherapy  9/9- port-a-cath placement deferred  9/12- palliative radiation to uterus and upper vagina initiated, s/p port placement  Reviewed I/O's: +491 ml x 24 hours and +2.1 L since admission   Pt sleeping soundly at time of visit. No family at bedside. Pt did not awaken to voice being called.   Pt with good appetite. Noted meal completions 100%. Pt drinking Ensure supplements.   No new wt since admission.   Per TOC notes, plan for SNF placement at discharge. Pt accepted bed at [facility].   Medications reviewed and include augmentin , benadryl , lexapro , and folic acid .   Labs reviewed.    Diet Order:   Diet Order             Diet regular Room service appropriate? Yes; Fluid consistency: Thin  Diet effective now                   EDUCATION NEEDS:   Education needs have been addressed  Skin:  Skin Assessment: Reviewed RN Assessment  Last BM:  12/20/23  Height:   Ht Readings from Last 1 Encounters:  12/10/23 5' 1 (1.549 m)     Weight:   Wt Readings from Last 1 Encounters:  12/10/23 49 kg    Ideal Body Weight:  47.7 kg  BMI:  Body mass index is 20.41 kg/m.  Estimated Nutritional Needs:   Kcal:  1750-1950  Protein:  90-105 grams  Fluid:  1.7-1.9 L    Margery ORN, RD, LDN, CDCES Registered Dietitian III Certified Diabetes Care and Education Specialist If unable to reach this RD, please use RD Inpatient group chat on secure chat between hours of 8am-4 pm daily

## 2023-12-23 ENCOUNTER — Other Ambulatory Visit: Payer: Self-pay

## 2023-12-23 ENCOUNTER — Ambulatory Visit

## 2023-12-23 DIAGNOSIS — R6510 Systemic inflammatory response syndrome (SIRS) of non-infectious origin without acute organ dysfunction: Secondary | ICD-10-CM | POA: Diagnosis not present

## 2023-12-23 LAB — RAD ONC ARIA SESSION SUMMARY
Course Elapsed Days: 5
Plan Fractions Treated to Date: 4
Plan Prescribed Dose Per Fraction: 3 Gy
Plan Total Fractions Prescribed: 10
Plan Total Prescribed Dose: 30 Gy
Reference Point Dosage Given to Date: 12 Gy
Reference Point Session Dosage Given: 3 Gy
Session Number: 4

## 2023-12-23 LAB — CBC WITH DIFFERENTIAL/PLATELET
Abs Immature Granulocytes: 0.16 K/uL — ABNORMAL HIGH (ref 0.00–0.07)
Basophils Absolute: 0.1 K/uL (ref 0.0–0.1)
Basophils Relative: 1 %
Eosinophils Absolute: 0.2 K/uL (ref 0.0–0.5)
Eosinophils Relative: 2 %
HCT: 31.6 % — ABNORMAL LOW (ref 36.0–46.0)
Hemoglobin: 9.8 g/dL — ABNORMAL LOW (ref 12.0–15.0)
Immature Granulocytes: 1 %
Lymphocytes Relative: 18 %
Lymphs Abs: 2 K/uL (ref 0.7–4.0)
MCH: 25.9 pg — ABNORMAL LOW (ref 26.0–34.0)
MCHC: 31 g/dL (ref 30.0–36.0)
MCV: 83.4 fL (ref 80.0–100.0)
Monocytes Absolute: 0.8 K/uL (ref 0.1–1.0)
Monocytes Relative: 7 %
Neutro Abs: 8.2 K/uL — ABNORMAL HIGH (ref 1.7–7.7)
Neutrophils Relative %: 71 %
Platelets: 714 K/uL — ABNORMAL HIGH (ref 150–400)
RBC: 3.79 MIL/uL — ABNORMAL LOW (ref 3.87–5.11)
RDW: 18 % — ABNORMAL HIGH (ref 11.5–15.5)
WBC: 11.4 K/uL — ABNORMAL HIGH (ref 4.0–10.5)
nRBC: 0 % (ref 0.0–0.2)

## 2023-12-23 MED ORDER — IRON SUCROSE 300 MG IVPB - SIMPLE MED
300.0000 mg | Freq: Once | Status: AC
  Administered 2023-12-23: 300 mg via INTRAVENOUS
  Filled 2023-12-23: qty 300

## 2023-12-23 NOTE — Progress Notes (Signed)
 Pt is going down to radiation treatment.

## 2023-12-23 NOTE — Progress Notes (Signed)
 PROGRESS NOTE    Valerie Leonard  FMW:[id number] DOB: [birth year] DOA: 12/10/2023 PCP: Pcp, No    Brief Narrative:   Valerie Leonard is a 68 y.o. female with medical history significant of endometrial cancer not yet on chemotherapy, depressive disorder, history of alcoholism, who was at the cancer center today to get her access for initiating chemotherapy.  She was noted to have fever and concern for sepsis was raised.  She was subsequently sent over to to the ER for evaluation.  She had recent UTI for which she was treated with cefdinir .  The urine culture was nondiagnostic with multiple species.  Today however patient noted to have white count of 26,000.Lactic acid 2.2.  She had a temperature 102.2.  Also hypotensive on arrival with systolic of 70 and diastolic 48.  Patient appears to have severe sepsis.  The source is not clear but suspicion for possible bacteremia.  Patient is therefore being admitted with sepsis of unknown source.  She is confused.  Appears to have acute metabolic encephalopathy from the incident.    Assessment & Plan:   Principal Problem:   SIRS (systemic inflammatory response syndrome) (HCC) Active Problems:   Chronic alcohol abuse   Severe major depression, single episode, without psychotic features (HCC)   Anemia   Endometrial cancer (HCC)   Endometrial carcinoma (HCC)   Postmenopausal bleeding   Protein-calorie malnutrition, severe   Fever   Leukocytosis SIRS:  No obvious source   Blood cxs NGTD. Urine cx shows no growth. CXR was neg. RSV, influenza, & COVID19 are all neg. New diarrhea but has had multiple stool softeners. Ceftiraxone/flagyl  converted to unasyn  by ID. CT/abd pelvis had limited evaluation of uterine mass. Per gyn/onc no pyometra. ID thinks likely translocation of bacteria caused by malignancy. have converted to augmentin  and plan to d/c on that.  No clear end date for Augmentin .  Will defer to oncology in outpatient setting.   Diarrhea: likely 2/2  stool softeners and abx. Discussed with ID 9/14 as no fever or sig abd pain and as wbc downtrending will hold on c diff eval, treat with loperamide . Gi pathogen panel neg. diarrhea is improving   Endometrial cancer with bleeding: stage III. Oncologist is Dr. Melanee. Port placed 9/11. Onco following and recs apprec, gyn onc now also involved. Packing placed in vagina 9/10, cervix is involved. Rad onc involved, first radiation on 9/12, plan for total of 10 treatments. Hopefully radiation helps control bleeding. Chemo planned after course of radiation. Foley placed 9/10 and discontinued 9/13, packing removed by gyn 9/13 and bleeding appears controlled.  Hemoglobin stable   ACD, iron  deficiency anemia: secondary to endometrial bleed. S/p 1 unit of pRBCs transfused so far.  IV iron  x 1 transfuse per.  Iron  indices indicate severe iron  deficiency anemia on 9/17.  Likely secondary to blood loss.  Will transfuse additional 300 mg IV Venofer  x 1.   Hx of alcohol abuse: remote hx. Will continue to monitor     Acute metabolic encephalopathy: resolved though appears to have some degree of baseline cognitive impairment   Hypotension: persists. With above bleeding. Relatively asymptomatic.      DVT prophylaxis: SCDs Code Status: Full Family Communication: None today Disposition Plan: Status is: Inpatient Remains inpatient appropriate because: Unsafe discharge plan   Level of care: Telemetry Medical  Consultants:  None  Procedures:  None  Antimicrobials: Augmentin    Subjective: And examined.  Sitting up in chair.  No visible distress  Objective: Vitals:  12/22/23 1557 12/22/23 2047 12/23/23 0341 12/23/23 0818  BP: (!) 107/59 92/67  102/60  Pulse: 97 94 91 87  Resp: 16   16  Temp: 98.5 F (36.9 C) 98.6 F (37 C) 98.5 F (36.9 C) 98.5 F (36.9 C)  TempSrc:  Oral  Oral  SpO2: 100% 99% 98% 100%  Weight:      Height:        Intake/Output Summary (Last 24 hours) at 12/23/2023 1407 Last  data filed at 12/23/2023 1300 Gross per 24 hour  Intake 720 ml  Output --  Net 720 ml   Filed Weights   12/10/23 1517  Weight: 49 kg    Examination:  General exam: Frail appearing Respiratory system: Clear to auscultation. Respiratory effort normal. Cardiovascular system: S1-S2, RRR, no murmurs, no pedal edema Gastrointestinal system: Thin, soft, T/ND, normal bowel sounds Central nervous system: Alert and oriented. No focal neurological deficits. Extremities: Symmetric 5 x 5 power. Skin: No rashes, lesions or ulcers Psychiatry: Judgement and insight appear normal. Mood & affect appropriate.     Data Reviewed: I have personally reviewed following labs and imaging studies  CBC: Recent Labs  Lab 12/19/23 1614 12/20/23 0503 12/21/23 0515 12/22/23 0540 12/23/23 1142  WBC 19.2* 14.5* 12.6* 12.1* 11.4*  NEUTROABS 16.4* 11.1* 9.1*  --  8.2*  HGB 9.3* 9.1* 8.3* 8.2* 9.8*  HCT 29.3* 29.4* 26.9* 26.4* 31.6*  MCV 83.2 83.3 83.5 83.0 83.4  PLT 615* 632* 595* 586* 714*   Basic Metabolic Panel: No results for input(s): NA, K, CL, CO2, GLUCOSE, BUN, CREATININE, CALCIUM , MG, PHOS in the last 168 hours. GFR: Estimated Creatinine Clearance: 51.5 mL/min (by C-G formula based on SCr of 0.45 mg/dL). Liver Function Tests: No results for input(s): AST, ALT, ALKPHOS, BILITOT, PROT, ALBUMIN in the last 168 hours. No results for input(s): LIPASE, AMYLASE in the last 168 hours. No results for input(s): AMMONIA in the last 168 hours. Coagulation Profile: No results for input(s): INR, PROTIME in the last 168 hours. Cardiac Enzymes: No results for input(s): CKTOTAL, CKMB, CKMBINDEX, TROPONINI in the last 168 hours. BNP (last 3 results) No results for input(s): PROBNP in the last 8760 hours. HbA1C: No results for input(s): HGBA1C in the last 72 hours. CBG: No results for input(s): GLUCAP in the last 168 hours. Lipid Profile: No  results for input(s): CHOL, HDL, LDLCALC, TRIG, CHOLHDL, LDLDIRECT in the last 72 hours. Thyroid  Function Tests: No results for input(s): TSH, T4TOTAL, FREET4, T3FREE, THYROIDAB in the last 72 hours. Anemia Panel: No results for input(s): VITAMINB12, FOLATE, FERRITIN, TIBC, IRON , RETICCTPCT in the last 72 hours. Sepsis Labs: No results for input(s): PROCALCITON, LATICACIDVEN in the last 168 hours.  Recent Results (from the past 240 hours)  Gastrointestinal Panel by PCR , Stool     Status: None   Collection Time: 12/16/23  9:36 AM   Specimen: Stool  Result Value Ref Range Status   Campylobacter species NOT DETECTED NOT DETECTED Final   Plesimonas shigelloides NOT DETECTED NOT DETECTED Final   Salmonella species NOT DETECTED NOT DETECTED Final   Yersinia enterocolitica NOT DETECTED NOT DETECTED Final   Vibrio species NOT DETECTED NOT DETECTED Final   Vibrio cholerae NOT DETECTED NOT DETECTED Final   Enteroaggregative E coli (EAEC) NOT DETECTED NOT DETECTED Final   Enteropathogenic E coli (EPEC) NOT DETECTED NOT DETECTED Final   Enterotoxigenic E coli (ETEC) NOT DETECTED NOT DETECTED Final   Shiga like toxin producing E coli (STEC) NOT DETECTED NOT  DETECTED Final   Shigella/Enteroinvasive E coli (EIEC) NOT DETECTED NOT DETECTED Final   Cryptosporidium NOT DETECTED NOT DETECTED Final   Cyclospora cayetanensis NOT DETECTED NOT DETECTED Final   Entamoeba histolytica NOT DETECTED NOT DETECTED Final   Giardia lamblia NOT DETECTED NOT DETECTED Final   Adenovirus F40/41 NOT DETECTED NOT DETECTED Final   Astrovirus NOT DETECTED NOT DETECTED Final   Norovirus GI/GII NOT DETECTED NOT DETECTED Final   Rotavirus A NOT DETECTED NOT DETECTED Final   Sapovirus (I, II, IV, and V) NOT DETECTED NOT DETECTED Final    Comment: Performed at [facility], [address] [place], [place] [address]         Radiology Studies: No results  found.      Scheduled Meds:  amoxicillin -clavulanate  1 tablet Oral BID   Chlorhexidine  Gluconate Cloth  6 each Topical Daily   diphenhydrAMINE   50 mg Oral QHS   escitalopram   5 mg Oral Daily   feeding supplement  237 mL Oral TID BM   folic acid   1 mg Oral Daily   multivitamin with minerals  1 tablet Oral Daily   sodium chloride  flush  10-40 mL Intracatheter Q12H   Continuous Infusions:   LOS: 13 days    Valerie KATHEE Robson, MD [facility]   If 7PM-7AM, please contact night-coverage  12/23/2023, 2:07 PM

## 2023-12-23 NOTE — Progress Notes (Signed)
 Mobility Specialist Progress Note:    12/23/23 1219  Mobility  Activity Ambulated with assistance  Level of Assistance Minimal assist, patient does 75% or more  Assistive Device Front wheel walker  Distance Ambulated (ft) 40 ft  Range of Motion/Exercises Active;All extremities  Activity Response Tolerated well  Mobility visit 1 Mobility  Mobility Specialist Start Time (ACUTE ONLY) 1129  Mobility Specialist Stop Time (ACUTE ONLY) 1211  Mobility Specialist Time Calculation (min) (ACUTE ONLY) 42 min   Pt ambulated with MinA and RW, then wheeled outside for leisure. Tolerated well, asx throughout. Returned supine, all needs met.  Sherrilee Ditty Mobility Specialist Please contact via Special educational needs teacher or  Rehab office at [phone]

## 2023-12-23 NOTE — Plan of Care (Signed)
  Problem: Clinical Measurements: Goal: Diagnostic test results will improve Outcome: Progressing   Problem: Education: Goal: Knowledge of General Education information will improve Description: Including pain rating scale, medication(s)/side effects and non-pharmacologic comfort measures Outcome: Progressing   Problem: Activity: Goal: Risk for activity intolerance will decrease Outcome: Progressing   Problem: Nutrition: Goal: Adequate nutrition will be maintained Outcome: Progressing

## 2023-12-23 NOTE — TOC Progression Note (Signed)
 Transition of Care [facility]) - Progression Note    Patient Details  Name: Valerie Leonard MRN: [id number] Date of Birth: Mar 07, 1956  [facility]) CM/SW Contact  Dalia GORMAN Fuse, RN Phone Number: 12/23/2023, 3:25 PM  Clinical Narrative:    TOC received message from [facility] Must advising incorrect documents were received. TOC attached documents in [place] MUST and submitted.    Expected Discharge Plan: Skilled Nursing Facility Barriers to Discharge: Continued Medical Work up               Expected Discharge Plan and Services In-house Referral: Clinical Social Work Discharge Planning Services: CM Consult Post Acute Care Choice: Skilled Nursing Facility Living arrangements for the past 2 months: Single Family Home                 DME Arranged: N/A DME Agency: NA       HH Arranged: NA HH Agency: NA         Social Drivers of Health (SDOH) Interventions SDOH Screenings   Food Insecurity: No Food Insecurity (12/10/2023)  Recent Concern: Food Insecurity - Food Insecurity Present (09/27/2023)  Housing: Low Risk  (12/10/2023)  Transportation Needs: No Transportation Needs (12/10/2023)  Recent Concern: Transportation Needs - Unmet Transportation Needs (09/27/2023)  Utilities: Not At Risk (12/10/2023)  Depression (PHQ2-9): Low Risk  (12/10/2023)  Financial Resource Strain: Low Risk  (10/13/2023)   Received from [facility]  Social Connections: Unknown (12/11/2023)  Recent Concern: Social Connections - Socially Isolated (09/28/2023)  Tobacco Use: Medium Risk (12/11/2023)    Readmission Risk Interventions     No data to display

## 2023-12-23 NOTE — Progress Notes (Signed)
 Pt back in room 111 call bell in reach bed in lowest position.

## 2023-12-23 NOTE — Plan of Care (Signed)
  Problem: Clinical Measurements: Goal: Signs and symptoms of infection will decrease Outcome: Progressing   Problem: Respiratory: Goal: Ability to maintain adequate ventilation will improve Outcome: Progressing   Problem: Clinical Measurements: Goal: Respiratory complications will improve Outcome: Progressing   Problem: Nutrition: Goal: Adequate nutrition will be maintained Outcome: Progressing   Problem: Coping: Goal: Level of anxiety will decrease Outcome: Progressing   Problem: Pain Managment: Goal: General experience of comfort will improve and/or be controlled Outcome: Progressing

## 2023-12-24 ENCOUNTER — Encounter: Payer: Self-pay | Admitting: Oncology

## 2023-12-24 ENCOUNTER — Ambulatory Visit

## 2023-12-24 ENCOUNTER — Other Ambulatory Visit: Payer: Self-pay

## 2023-12-24 DIAGNOSIS — R6510 Systemic inflammatory response syndrome (SIRS) of non-infectious origin without acute organ dysfunction: Secondary | ICD-10-CM | POA: Diagnosis not present

## 2023-12-24 LAB — RAD ONC ARIA SESSION SUMMARY
Course Elapsed Days: 6
Plan Fractions Treated to Date: 5
Plan Prescribed Dose Per Fraction: 3 Gy
Plan Total Fractions Prescribed: 10
Plan Total Prescribed Dose: 30 Gy
Reference Point Dosage Given to Date: 15 Gy
Reference Point Session Dosage Given: 3 Gy
Session Number: 5

## 2023-12-24 MED ORDER — ENSURE PLUS HIGH PROTEIN PO LIQD: 237.0000 mL | Freq: Three times a day (TID) | ORAL | Status: AC

## 2023-12-24 MED ORDER — DIPHENHYDRAMINE HCL 50 MG PO CAPS: 50.0000 mg | ORAL_CAPSULE | Freq: Every evening | ORAL | Status: AC | PRN

## 2023-12-24 MED ORDER — ALPRAZOLAM 0.25 MG PO TABS: 0.2500 mg | ORAL_TABLET | Freq: Two times a day (BID) | ORAL | 0 refills | Status: AC | PRN

## 2023-12-24 MED ORDER — FERROUS SULFATE 325 (65 FE) MG PO TABS: 325.0000 mg | ORAL_TABLET | Freq: Every day | ORAL | Status: AC

## 2023-12-24 MED ORDER — FERROUS SULFATE 325 (65 FE) MG PO TABS
325.0000 mg | ORAL_TABLET | Freq: Every day | ORAL | Status: DC
  Administered 2023-12-24 – 2023-12-25 (×2): 325 mg via ORAL
  Filled 2023-12-24 (×2): qty 1

## 2023-12-24 MED ORDER — AMOXICILLIN-POT CLAVULANATE 500-125 MG PO TABS: 1.0000 | ORAL_TABLET | Freq: Two times a day (BID) | ORAL | Status: AC

## 2023-12-24 NOTE — TOC Progression Note (Addendum)
 Transition of Care [facility]) - Progression Note    Patient Details  Name: Valerie Leonard MRN: [id number] Date of Birth: 05-06-1955  Transition of Care [facility]) CM/SW Contact  Dalia GORMAN Fuse, RN Phone Number: 12/24/2023, 10:04 AM  Clinical Narrative:    Kaye CHI from [place] MUST :PASRR [phone] [id number]  Placed call to [facility]. They don't have a bed today, but should be able to accept the patient tomorrow.   TOC spoke with Massie and confirmed they can accept the patient while is she is actively receiving radiation tx. Her last treatment is next Thursday.  Expected Discharge Plan: Skilled Nursing Facility Barriers to Discharge: Continued Medical Work up               Expected Discharge Plan and Services In-house Referral: Clinical Social Work Discharge Planning Services: CM Consult Post Acute Care Choice: Skilled Nursing Facility Living arrangements for the past 2 months: Single Family Home                 DME Arranged: N/A DME Agency: NA       HH Arranged: NA HH Agency: NA         Social Drivers of Health (SDOH) Interventions SDOH Screenings   Food Insecurity: No Food Insecurity (12/10/2023)  Recent Concern: Food Insecurity - Food Insecurity Present (09/27/2023)  Housing: Low Risk  (12/10/2023)  Transportation Needs: No Transportation Needs (12/10/2023)  Recent Concern: Transportation Needs - Unmet Transportation Needs (09/27/2023)  Utilities: Not At Risk (12/10/2023)  Depression (PHQ2-9): Low Risk  (12/10/2023)  Financial Resource Strain: Low Risk  (10/13/2023)   Received from [facility]  Social Connections: Unknown (12/11/2023)  Recent Concern: Social Connections - Socially Isolated (09/28/2023)  Tobacco Use: Medium Risk (12/11/2023)    Readmission Risk Interventions     No data to display

## 2023-12-24 NOTE — Plan of Care (Signed)
  Problem: Clinical Measurements: Goal: Signs and symptoms of infection will decrease Outcome: Progressing   Problem: Respiratory: Goal: Ability to maintain adequate ventilation will improve Outcome: Progressing   Problem: Clinical Measurements: Goal: Respiratory complications will improve Outcome: Progressing Goal: Cardiovascular complication will be avoided Outcome: Progressing   Problem: Safety: Goal: Ability to remain free from injury will improve Outcome: Progressing   Problem: Skin Integrity: Goal: Risk for impaired skin integrity will decrease Outcome: Progressing

## 2023-12-24 NOTE — Progress Notes (Addendum)
 Physical Therapy Treatment Patient Details Name: Valerie Leonard MRN: [id number] DOB: December 28, 1955 Today's Date: 12/24/2023   History of Present Illness Patient is a 68 year old female with sepsis. PMH: endometrial cancer, alcohol abuse, acute metabolic encephalopathy    PT Comments  Pt A&Ox4, pleasant and agreeable to participate in PT tx. Pt denied pain throughout session. Pt was met long-sitting in bed, able to transfer to sitting EOB with supervision and use of bed features. Pt completed 2 STS from EOB with RW and CGA, VC for hand placement to avoid pulling to stand. Pt amb ~22ft with RW and CGA, no LOB throughout, continues to demonstrate tendency to maintain R foot plantarflexed in stance and unable to maintain foot flat despite cues. Pt was returned to supine in bed at end of session, all needs in reach. Pt continues to demonstrate deficits in endurance, activity tolerance, and decreased gait velocity. The patient would benefit from further skilled PT intervention to continue to progress towards goals.      If plan is discharge home, recommend the following: A little help with walking and/or transfers;A lot of help with bathing/dressing/bathroom;Assistance with cooking/housework;Assist for transportation;Help with stairs or ramp for entrance   Can travel by private vehicle     Yes  Equipment Recommendations  Other (comment) (TBD)    Recommendations for Other Services       Precautions / Restrictions Precautions Precautions: Fall Recall of Precautions/Restrictions: Impaired Restrictions Weight Bearing Restrictions Per Provider Order: No     Mobility  Bed Mobility Overal bed mobility: Needs Assistance Bed Mobility: Supine to Sit     Supine to sit: Supervision, Used rails Sit to supine: Supervision, Used rails   General bed mobility comments: no physical assistance, increased time/effort    Transfers Overall transfer level: Needs assistance Equipment used: Rolling walker  (2 wheels) Transfers: Sit to/from Stand Sit to Stand: Contact guard assist           General transfer comment: STS from EOB x2 with CGA, VC for hand placement to avoid pulling to stand from RW    Ambulation/Gait Ambulation/Gait assistance: Contact guard assist Gait Distance (Feet): 75 Feet Assistive device: Rolling walker (2 wheels) Gait Pattern/deviations: Step-through pattern, Narrow base of support Gait velocity: very decreased     General Gait Details: R foot plantarflexed in stance throughout amb, pt unable to maintain foot flat. No LOB, CGA throughout   Stairs             Wheelchair Mobility     Tilt Bed    Modified Rankin (Stroke Patients Only)       Balance Overall balance assessment: Needs assistance Sitting-balance support: Feet supported Sitting balance-Leahy Scale: Good Sitting balance - Comments: steady static and dynamic sitting   Standing balance support: Bilateral upper extremity supported, No upper extremity supported Standing balance-Leahy Scale: Fair Standing balance comment: able to stand and adjust bed linens with CGA                            Communication Communication Communication: No apparent difficulties  Cognition Arousal: Alert Behavior During Therapy: WFL for tasks assessed/performed   PT - Cognitive impairments: No apparent impairments                       PT - Cognition Comments: A&Ox4, easily distracted requiring frequent cues to redirect to task Following commands: Impaired Following commands impaired: Follows multi-step commands with  increased time    Cueing Cueing Techniques: Verbal cues, Visual cues  Exercises      General Comments        Pertinent Vitals/Pain Pain Assessment Pain Assessment: No/denies pain    Home Living                          Prior Function            PT Goals (current goals can now be found in the care plan section) Progress towards PT goals:  Progressing toward goals    Frequency    Min 2X/week      PT Plan      Co-evaluation              AM-PAC PT 6 Clicks Mobility   Outcome Measure  Help needed turning from your back to your side while in a flat bed without using bedrails?: A Little Help needed moving from lying on your back to sitting on the side of a flat bed without using bedrails?: A Little Help needed moving to and from a bed to a chair (including a wheelchair)?: A Little Help needed standing up from a chair using your arms (e.g., wheelchair or bedside chair)?: A Little Help needed to walk in hospital room?: A Little Help needed climbing 3-5 steps with a railing? : A Lot 6 Click Score: 17    End of Session Equipment Utilized During Treatment: Gait belt Activity Tolerance: Patient tolerated treatment well Patient left: in bed;with call bell/phone within reach;with bed alarm set Nurse Communication: Mobility status PT Visit Diagnosis: Muscle weakness (generalized) (M62.81);Unsteadiness on feet (R26.81)     Time: 8880-8860 PT Time Calculation (min) (ACUTE ONLY): 20 min  Charges:    $Therapeutic Activity: 8-22 mins PT General Charges $$ ACUTE PT VISIT: 1 Visit                     Virginie Josten, SPT

## 2023-12-24 NOTE — Progress Notes (Signed)
 PROGRESS NOTE    Valerie Leonard  FMW:[id number] DOB: [birth year] DOA: 12/10/2023 PCP: Pcp, No    Brief Narrative:   Valerie Leonard is a 68 y.o. female with medical history significant of endometrial cancer not yet on chemotherapy, depressive disorder, history of alcoholism, who was at the cancer center today to get her access for initiating chemotherapy.  She was noted to have fever and concern for sepsis was raised.  She was subsequently sent over to to the ER for evaluation.  She had recent UTI for which she was treated with cefdinir .  The urine culture was nondiagnostic with multiple species.  Today however patient noted to have white count of 26,000.Lactic acid 2.2.  She had a temperature 102.2.  Also hypotensive on arrival with systolic of 70 and diastolic 48.  Patient appears to have severe sepsis.  The source is not clear but suspicion for possible bacteremia.  Patient is therefore being admitted with sepsis of unknown source.  She is confused.  Appears to have acute metabolic encephalopathy from the incident.    Assessment & Plan:   Principal Problem:   SIRS (systemic inflammatory response syndrome) (HCC) Active Problems:   Chronic alcohol abuse   Severe major depression, single episode, without psychotic features (HCC)   Anemia   Endometrial cancer (HCC)   Endometrial carcinoma (HCC)   Postmenopausal bleeding   Protein-calorie malnutrition, severe   Fever   Leukocytosis SIRS:  No obvious source   Blood cxs NGTD. Urine cx shows no growth. CXR was neg. RSV, influenza, & COVID19 are all neg. New diarrhea but has had multiple stool softeners. Ceftiraxone/flagyl  converted to unasyn  by ID. CT/abd pelvis had limited evaluation of uterine mass. Per gyn/onc no pyometra. ID thinks likely translocation of bacteria caused by malignancy. have converted to augmentin  and plan to d/c on that.  No clear end date for Augmentin .  Will defer to oncology in outpatient setting.   Diarrhea: likely 2/2  stool softeners and abx. Discussed with ID 9/14 as no fever or sig abd pain and as wbc downtrending will hold on c diff eval, treat with loperamide . Gi pathogen panel neg. diarrhea is improving   Endometrial cancer with bleeding: stage III. Oncologist is Dr. Melanee. Port placed 9/11. Onco following and recs apprec, gyn onc now also involved. Packing placed in vagina 9/10, cervix is involved. Rad onc involved, first radiation on 9/12, plan for total of 10 treatments. Hopefully radiation helps control bleeding. Chemo planned after course of radiation. Foley placed 9/10 and discontinued 9/13, packing removed by gyn 9/13 and bleeding appears controlled.  Hemoglobin stable   ACD, iron  deficiency anemia: secondary to endometrial bleed. S/p 1 unit of pRBCs transfused so far.  IV iron  x 1 transfuse per.  Iron  indices indicate severe iron  deficiency anemia on 9/17.  Likely secondary to blood loss.  Transfused additional 300 mg IV Venofer  x 1 on 9/17.  Start p.o. iron  325 mg daily on 9/18   Hx of alcohol abuse: remote hx. no acute issues    Acute metabolic encephalopathy: Resolved   Hypotension: persists. With above bleeding. Relatively asymptomatic.      DVT prophylaxis: SCDs Code Status: Full Family Communication: None today Disposition Plan: Status is: Inpatient Remains inpatient appropriate because: Unsafe discharge plan   Level of care: Telemetry Medical  Consultants:  None  Procedures:  None  Antimicrobials: Augmentin    Subjective: Seen and examined.  Resting comfortably in bed.  No visible distress.  No complaints of pain.  Objective: Vitals:   12/23/23 0818 12/23/23 2003 12/24/23 0400 12/24/23 0912  BP: 102/60 (!) 100/56 98/72 91/61   Pulse: 87 83 80 82  Resp: 16 17 17 18   Temp: 98.5 F (36.9 C) 98.2 F (36.8 C) 98.1 F (36.7 C) 98.3 F (36.8 C)  TempSrc: Oral  Oral   SpO2: 100% 99% 100% 99%  Weight:      Height:        Intake/Output Summary (Last 24 hours) at 12/24/2023  1432 Last data filed at 12/23/2023 1548 Gross per 24 hour  Intake 182.59 ml  Output --  Net 182.59 ml   Filed Weights   12/10/23 1517  Weight: 49 kg    Examination:  General exam: Appears frail Respiratory system: Clear to auscultation. Respiratory effort normal. Cardiovascular system: S1-S2, RRR, no murmurs, no pedal edema Gastrointestinal system: Thin, soft, T/ND, normal bowel sounds Central nervous system: Alert and oriented. No focal neurological deficits. Extremities: Symmetric 5 x 5 power. Skin: No rashes, lesions or ulcers Psychiatry: Judgement and insight appear normal. Mood & affect appropriate.     Data Reviewed: I have personally reviewed following labs and imaging studies  CBC: Recent Labs  Lab 12/19/23 1614 12/20/23 0503 12/21/23 0515 12/22/23 0540 12/23/23 1142  WBC 19.2* 14.5* 12.6* 12.1* 11.4*  NEUTROABS 16.4* 11.1* 9.1*  --  8.2*  HGB 9.3* 9.1* 8.3* 8.2* 9.8*  HCT 29.3* 29.4* 26.9* 26.4* 31.6*  MCV 83.2 83.3 83.5 83.0 83.4  PLT 615* 632* 595* 586* 714*   Basic Metabolic Panel: No results for input(s): NA, K, CL, CO2, GLUCOSE, BUN, CREATININE, CALCIUM , MG, PHOS in the last 168 hours. GFR: Estimated Creatinine Clearance: 51.5 mL/min (by C-G formula based on SCr of 0.45 mg/dL). Liver Function Tests: No results for input(s): AST, ALT, ALKPHOS, BILITOT, PROT, ALBUMIN in the last 168 hours. No results for input(s): LIPASE, AMYLASE in the last 168 hours. No results for input(s): AMMONIA in the last 168 hours. Coagulation Profile: No results for input(s): INR, PROTIME in the last 168 hours. Cardiac Enzymes: No results for input(s): CKTOTAL, CKMB, CKMBINDEX, TROPONINI in the last 168 hours. BNP (last 3 results) No results for input(s): PROBNP in the last 8760 hours. HbA1C: No results for input(s): HGBA1C in the last 72 hours. CBG: No results for input(s): GLUCAP in the last 168 hours. Lipid  Profile: No results for input(s): CHOL, HDL, LDLCALC, TRIG, CHOLHDL, LDLDIRECT in the last 72 hours. Thyroid  Function Tests: No results for input(s): TSH, T4TOTAL, FREET4, T3FREE, THYROIDAB in the last 72 hours. Anemia Panel: No results for input(s): VITAMINB12, FOLATE, FERRITIN, TIBC, IRON , RETICCTPCT in the last 72 hours. Sepsis Labs: No results for input(s): PROCALCITON, LATICACIDVEN in the last 168 hours.  Recent Results (from the past 240 hours)  Gastrointestinal Panel by PCR , Stool     Status: None   Collection Time: 12/16/23  9:36 AM   Specimen: Stool  Result Value Ref Range Status   Campylobacter species NOT DETECTED NOT DETECTED Final   Plesimonas shigelloides NOT DETECTED NOT DETECTED Final   Salmonella species NOT DETECTED NOT DETECTED Final   Yersinia enterocolitica NOT DETECTED NOT DETECTED Final   Vibrio species NOT DETECTED NOT DETECTED Final   Vibrio cholerae NOT DETECTED NOT DETECTED Final   Enteroaggregative E coli (EAEC) NOT DETECTED NOT DETECTED Final   Enteropathogenic E coli (EPEC) NOT DETECTED NOT DETECTED Final   Enterotoxigenic E coli (ETEC) NOT DETECTED NOT DETECTED Final   Shiga like toxin producing E coli (  STEC) NOT DETECTED NOT DETECTED Final   Shigella/Enteroinvasive E coli (EIEC) NOT DETECTED NOT DETECTED Final   Cryptosporidium NOT DETECTED NOT DETECTED Final   Cyclospora cayetanensis NOT DETECTED NOT DETECTED Final   Entamoeba histolytica NOT DETECTED NOT DETECTED Final   Giardia lamblia NOT DETECTED NOT DETECTED Final   Adenovirus F40/41 NOT DETECTED NOT DETECTED Final   Astrovirus NOT DETECTED NOT DETECTED Final   Norovirus GI/GII NOT DETECTED NOT DETECTED Final   Rotavirus A NOT DETECTED NOT DETECTED Final   Sapovirus (I, II, IV, and V) NOT DETECTED NOT DETECTED Final    Comment: Performed at [facility], [address], [place], [place] [address]         Radiology Studies: No results  found.      Scheduled Meds:  amoxicillin -clavulanate  1 tablet Oral BID   Chlorhexidine  Gluconate Cloth  6 each Topical Daily   diphenhydrAMINE   50 mg Oral QHS   escitalopram   5 mg Oral Daily   feeding supplement  237 mL Oral TID BM   ferrous sulfate   325 mg Oral Q breakfast   folic acid   1 mg Oral Daily   multivitamin with minerals  1 tablet Oral Daily   sodium chloride  flush  10-40 mL Intracatheter Q12H   Continuous Infusions:   LOS: 14 days    Calvin KATHEE Robson, MD [facility]   If 7PM-7AM, please contact night-coverage  12/24/2023, 2:32 PM

## 2023-12-24 NOTE — Discharge Summary (Incomplete)
 Physician Discharge Summary  Valerie Leonard FMW:[id number] DOB: 05/17/1955 DOA: 12/10/2023  PCP: Pcp, No  Admit date: 12/10/2023 Discharge date: 12/25/2023  Admitted From: Home Disposition:  SNF  Recommendations for Outpatient Follow-up:  Follow up with PCP in 1-2 weeks Follow up with oncology as directed  Home Health:No  Equipment/Devices:None   Discharge Condition:Stable  CODE STATUS:FULL  Diet recommendation: Reg  Brief/Interim Summary:   Valerie Leonard is a 68 y.o. female with medical history significant of endometrial cancer not yet on chemotherapy, depressive disorder, history of alcoholism, who was at the cancer center today to get her access for initiating chemotherapy.  She was noted to have fever and concern for sepsis was raised.  She was subsequently sent over to to the ER for evaluation.  She had recent UTI for which she was treated with cefdinir .  The urine culture was nondiagnostic with multiple species.  Today however patient noted to have white count of 26,000.Lactic acid 2.2.  She had a temperature 102.2.  Also hypotensive on arrival with systolic of 70 and diastolic 48.  Patient appears to have severe sepsis.  The source is not clear but suspicion for possible bacteremia.  Patient is therefore being admitted with sepsis of unknown source.  She is confused.  Appears to have acute metabolic encephalopathy from the incident.       Discharge Diagnoses:   SIRS:  No obvious source   Blood cxs NGTD. Urine cx shows no growth. CXR was neg. RSV, influenza, & COVID19 are all neg. New diarrhea but has had multiple stool softeners. Ceftiraxone/flagyl  converted to unasyn  by ID. CT/abd pelvis had limited evaluation of uterine mass. Per gyn/onc no pyometra. ID thinks likely translocation of bacteria caused by malignancy. have converted to augmentin  and plan to d/c on that.  No clear end date for Augmentin .  Will defer to oncology in outpatient setting.   Diarrhea: likely 2/2 stool  softeners and abx. Discussed with ID 9/14 as no fever or sig abd pain and as wbc downtrending will hold on c diff eval, treat with loperamide . Gi pathogen panel neg. diarrhea is improving   Endometrial cancer with bleeding: stage III. Oncologist is Dr. Melanee. Port placed 9/11. Onco following and recs apprec, gyn onc now also involved. Packing placed in vagina 9/10, cervix is involved. Rad onc involved, first radiation on 9/12, plan for total of 10 treatments. Hopefully radiation helps control bleeding. Chemo planned after course of radiation. Foley placed 9/10 and discontinued 9/13, packing removed by gyn 9/13 and bleeding appears controlled.  Hemoglobin stable.    ACD, iron  deficiency anemia: secondary to endometrial bleed. S/p 1 unit of pRBCs transfused so far.  IV iron  x 1 transfuse per.  Iron  indices indicate severe iron  deficiency anemia on 9/17.  Likely secondary to blood loss.  No indication for transfusion.  Will start PO iron  on DC.  Recheck iron  indices in 3 months   Discharge Instructions   Allergies as of 12/24/2023   No Known Allergies      Medication List     PAUSE taking these medications    dexamethasone  4 MG tablet Wait to take this until your doctor or other care provider tells you to start again. Commonly known as: DECADRON  Take 2 tablets (8mg ) by mouth daily starting the day after carboplatin for 3 days. Take with food       STOP taking these medications    citalopram  20 MG tablet Commonly known as: CELEXA    medroxyPROGESTERone  10  MG tablet Commonly known as: PROVERA    spironolactone 25 MG tablet Commonly known as: ALDACTONE       TAKE these medications    ALPRAZolam  0.25 MG tablet Commonly known as: XANAX  Take 1 tablet (0.25 mg total) by mouth 2 (two) times daily as needed for anxiety.   amoxicillin -clavulanate 500-125 MG tablet Commonly known as: AUGMENTIN  Take 1 tablet by mouth 2 (two) times daily.   diphenhydrAMINE  50 MG capsule Commonly known  as: BENADRYL  Take 1 capsule (50 mg total) by mouth at bedtime as needed for itching, allergies or sleep.   escitalopram  5 MG tablet Commonly known as: LEXAPRO  Take 1 tablet every day by oral route in the morning, for depression.   feeding supplement Liqd Take 237 mLs by mouth 3 (three) times daily between meals.   ferrous sulfate  325 (65 FE) MG tablet Take 1 tablet (325 mg total) by mouth daily with breakfast. Start taking on: December 25, 2023   folic acid  1 MG tablet Commonly known as: FOLVITE  Take 1 tablet (1 mg total) by mouth daily.   lidocaine -prilocaine  cream Commonly known as: EMLA  Apply to affected area once   multivitamin with minerals Tabs tablet Take 1 tablet by mouth daily.   ondansetron  8 MG tablet Commonly known as: Zofran  Take 1 tablet (8 mg total) by mouth every 8 (eight) hours as needed for nausea or vomiting. Start on the third day after carboplatin.   prochlorperazine  10 MG tablet Commonly known as: COMPAZINE  Take 1 tablet (10 mg total) by mouth every 6 (six) hours as needed for nausea or vomiting.        No Known Allergies  Consultations: Rad onc Gyn onc Med onc IR   Procedures/Studies: IR IMAGING GUIDED PORT INSERTION Result Date: 12/17/2023 INDICATION: Port-A-Cath needed for treatment of endometrial cancer. EXAM: FLUOROSCOPIC AND ULTRASOUND GUIDED PLACEMENT OF A SUBCUTANEOUS PORT MEDICATIONS: Moderate sedation ANESTHESIA/SEDATION: Moderate (conscious) sedation was employed during this procedure. A total of Versed  2 mg and fentanyl  100 mcg was administered intravenously at the order of the provider performing the procedure. Total intra-service moderate sedation time: 24 minutes. Patient's level of consciousness and vital signs were monitored continuously by radiology nurse throughout the procedure under the supervision of the provider performing the procedure. FLUOROSCOPY TIME:  Radiation Exposure Index (as provided by the fluoroscopic device): 1  mGy Kerma COMPLICATIONS: None immediate. PROCEDURE: The procedure, risks, benefits, and alternatives were explained to the patient. Questions regarding the procedure were encouraged and answered. The patient understands and consents to the procedure. Patient was placed supine on the interventional table. Ultrasound confirmed a patent right internal jugular vein. Ultrasound image was saved for documentation. The right chest and neck were cleaned with a skin antiseptic and a sterile drape was placed. Maximal barrier sterile technique was utilized including caps, mask, sterile gowns, sterile gloves, sterile drape, hand hygiene and skin antiseptic. The right neck was anesthetized with 1% lidocaine . Small incision was made in the right neck with a blade. Micropuncture set was placed in the right internal jugular vein with ultrasound guidance. The micropuncture wire was used for measurement purposes. The right chest was anesthetized with 1% lidocaine  with epinephrine . #15 blade was used to make an incision and a subcutaneous port pocket was formed. 8 french Power Port was assembled. Subcutaneous tunnel was formed with a stiff tunneling device. The port catheter was brought through the subcutaneous tunnel. The port was placed in the subcutaneous pocket. The micropuncture set was exchanged for a peel-away sheath. The  catheter was placed through the peel-away sheath and the tip was positioned at the superior cavoatrial junction. Catheter placement was confirmed with fluoroscopy. The port was accessed and flushed with heparinized saline. The port pocket was closed using two layers of absorbable sutures and Dermabond. The vein skin site was closed using a single layer of absorbable suture and Dermabond. Sterile dressings were applied. Patient tolerated the procedure well without an immediate complication. Ultrasound and fluoroscopic images were taken and saved for this procedure. IMPRESSION: Placement of a subcutaneous  power-injectable port device. Catheter tip at the superior cavoatrial junction. Electronically Signed   By: Juliene Balder M.D.   On: 12/17/2023 16:47   CT ABDOMEN PELVIS WO CONTRAST Result Date: 12/14/2023 EXAM: CT ABDOMEN AND PELVIS WITHOUT CONTRAST 12/14/2023 08:34:03 PM TECHNIQUE: CT of the abdomen and pelvis was performed without the administration of intravenous contrast. Multiplanar reformatted images are provided for review. Automated exposure control, iterative reconstruction, and/or weight-based adjustment of the mA/kV was utilized to reduce the radiation dose to as low as reasonably achievable. COMPARISON: 11/06/2023 CLINICAL HISTORY: Ureteral cancer, monitor; ? uterine infection. Has sepsis and unknown source. FINDINGS: LOWER CHEST: No acute abnormality. Unchanged 6 mm subpleural nodule in parenchyma (series 4, image 3). LIVER: The liver is unremarkable. GALLBLADDER AND BILE DUCTS: Gallbladder is unremarkable. No biliary ductal dilatation. SPLEEN: No acute abnormality. PANCREAS: No acute abnormality. ADRENAL GLANDS: No acute abnormality. KIDNEYS, URETERS AND BLADDER: No stones in the kidneys or ureters. No hydronephrosis. No perinephric or periureteral stranding. Urinary bladder is unremarkable. GI AND BOWEL: Stomach demonstrates no acute abnormality. There is no bowel obstruction. PERITONEUM AND RETROPERITONEUM: No ascites. No free air. VASCULATURE: Aortic atherosclerotic calcification. LYMPH NODES: Interval increase in size of multiple retroperitoneal lymph nodes. For example, a left periaortic lymph node measuring 10 mm, following series 2, image 35, previously measured 7 mm. A 20 mm right external iliac node on series 2, image 63 previously measured 18 mm. A 16 mm left external iliac node on series 263, previously measured 14 mm. REPRODUCTIVE ORGANS: Evaluation of the known mass in the uterus is limited without IV contrast. There is mass-like expansion centered in the lower uterine segment with  heterogeneous fluid in the uterine fundus. Findings are similar to CT dated 11/06/23, given the difference is in technique. 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 No acute osseous abnormality. No focal soft tissue abnormality. IMPRESSION: 1. Limited evaluation of the known uterine mass without IV contrast. Findings are similar to prior CT with contrast dated 11/06/23, given the difference in technique. 2. Interval increase in size of multiple retroperitoneal lymph nodes. Electronically signed by: Norman Gatlin MD 12/14/2023 09:05 PM EDT RP Workstation: [id number]   DG Chest Port 1 View Result Date: 12/10/2023 CLINICAL DATA:  Infection. EXAM: PORTABLE CHEST 1 VIEW COMPARISON:  11/22/2023. FINDINGS: The heart size and mediastinal contours are unchanged. No focal consolidation, pleural effusion, or pneumothorax. No acute osseous abnormality. IMPRESSION: No acute cardiopulmonary findings. Electronically Signed   By: Harrietta Sherry M.D.   On: 12/10/2023 16:20   IR PATIENT EVAL TECH 0-60 MINS Result Date: 12/10/2023 Delight Bebe SAILOR     12/10/2023 12:32 PM Procedure cancelled order used for wasted supplies     Subjective: Seen and examined on day of discharge.  Stable, appropriate for transfer to SNF  Discharge Exam: Vitals:   12/24/23 0400 12/24/23 0912  BP: 98/72 91/61  Pulse: 80 82  Resp: 17 18  Temp: 98.1 F (36.7 C) 98.3 F (  36.8 C)  SpO2: 100% 99%   Vitals:   12/23/23 0818 12/23/23 2003 12/24/23 0400 12/24/23 0912  BP: 102/60 (!) 100/56 98/72 91/61   Pulse: 87 83 80 82  Resp: 16 17 17 18   Temp: 98.5 F (36.9 C) 98.2 F (36.8 C) 98.1 F (36.7 C) 98.3 F (36.8 C)  TempSrc: Oral  Oral   SpO2: 100% 99% 100% 99%  Weight:      Height:        General: Pt is alert, awake, not in acute distress Cardiovascular: RRR, S1/S2 +, no rubs, no gallops Respiratory: CTA bilaterally, no wheezing, no rhonchi Abdominal: Soft, NT, ND, bowel sounds + Extremities: no edema, no  cyanosis    The results of significant diagnostics from this hospitalization (including imaging, microbiology, ancillary and laboratory) are listed below for reference.     Microbiology: Recent Results (from the past 240 hours)  Gastrointestinal Panel by PCR , Stool     Status: None   Collection Time: 12/16/23  9:36 AM   Specimen: Stool  Result Value Ref Range Status   Campylobacter species NOT DETECTED NOT DETECTED Final   Plesimonas shigelloides NOT DETECTED NOT DETECTED Final   Salmonella species NOT DETECTED NOT DETECTED Final   Yersinia enterocolitica NOT DETECTED NOT DETECTED Final   Vibrio species NOT DETECTED NOT DETECTED Final   Vibrio cholerae NOT DETECTED NOT DETECTED Final   Enteroaggregative E coli (EAEC) NOT DETECTED NOT DETECTED Final   Enteropathogenic E coli (EPEC) NOT DETECTED NOT DETECTED Final   Enterotoxigenic E coli (ETEC) NOT DETECTED NOT DETECTED Final   Shiga like toxin producing E coli (STEC) NOT DETECTED NOT DETECTED Final   Shigella/Enteroinvasive E coli (EIEC) NOT DETECTED NOT DETECTED Final   Cryptosporidium NOT DETECTED NOT DETECTED Final   Cyclospora cayetanensis NOT DETECTED NOT DETECTED Final   Entamoeba histolytica NOT DETECTED NOT DETECTED Final   Giardia lamblia NOT DETECTED NOT DETECTED Final   Adenovirus F40/41 NOT DETECTED NOT DETECTED Final   Astrovirus NOT DETECTED NOT DETECTED Final   Norovirus GI/GII NOT DETECTED NOT DETECTED Final   Rotavirus A NOT DETECTED NOT DETECTED Final   Sapovirus (I, II, IV, and V) NOT DETECTED NOT DETECTED Final    Comment: Performed at [facility], [address] [place], [place] [address]     Labs: BNP (last 3 results) No results for input(s): BNP in the last 8760 hours. Basic Metabolic Panel: No results for input(s): NA, K, CL, CO2, GLUCOSE, BUN, CREATININE, CALCIUM , MG, PHOS in the last 168 hours. Liver Function Tests: No results for input(s): AST, ALT, ALKPHOS,  BILITOT, PROT, ALBUMIN in the last 168 hours. No results for input(s): LIPASE, AMYLASE in the last 168 hours. No results for input(s): AMMONIA in the last 168 hours. CBC: Recent Labs  Lab 12/19/23 1614 12/20/23 0503 12/21/23 0515 12/22/23 0540 12/23/23 1142  WBC 19.2* 14.5* 12.6* 12.1* 11.4*  NEUTROABS 16.4* 11.1* 9.1*  --  8.2*  HGB 9.3* 9.1* 8.3* 8.2* 9.8*  HCT 29.3* 29.4* 26.9* 26.4* 31.6*  MCV 83.2 83.3 83.5 83.0 83.4  PLT 615* 632* 595* 586* 714*   Cardiac Enzymes: No results for input(s): CKTOTAL, CKMB, CKMBINDEX, TROPONINI in the last 168 hours. BNP: Invalid input(s): POCBNP CBG: No results for input(s): GLUCAP in the last 168 hours. D-Dimer No results for input(s): DDIMER in the last 72 hours. Hgb A1c No results for input(s): HGBA1C in the last 72 hours. Lipid Profile No results for input(s): CHOL, HDL, LDLCALC,  TRIG, CHOLHDL, LDLDIRECT in the last 72 hours. Thyroid  function studies No results for input(s): TSH, T4TOTAL, T3FREE, THYROIDAB in the last 72 hours.  Invalid input(s): FREET3 Anemia work up No results for input(s): VITAMINB12, FOLATE, FERRITIN, TIBC, IRON , RETICCTPCT in the last 72 hours. Urinalysis    Component Value Date/Time   COLORURINE AMBER (A) 12/10/2023 1625   APPEARANCEUR CLOUDY (A) 12/10/2023 1625   LABSPEC 1.025 12/10/2023 1625   PHURINE 5.0 12/10/2023 1625   GLUCOSEU NEGATIVE 12/10/2023 1625   HGBUR NEGATIVE 12/10/2023 1625   BILIRUBINUR NEGATIVE 12/10/2023 1625   KETONESUR 5 (A) 12/10/2023 1625   PROTEINUR 30 (A) 12/10/2023 1625   NITRITE NEGATIVE 12/10/2023 1625   LEUKOCYTESUR NEGATIVE 12/10/2023 1625   Sepsis Labs Recent Labs  Lab 12/20/23 0503 12/21/23 0515 12/22/23 0540 12/23/23 1142  WBC 14.5* 12.6* 12.1* 11.4*   Microbiology Recent Results (from the past 240 hours)  Gastrointestinal Panel by PCR , Stool     Status: None   Collection Time: 12/16/23  9:36 AM    Specimen: Stool  Result Value Ref Range Status   Campylobacter species NOT DETECTED NOT DETECTED Final   Plesimonas shigelloides NOT DETECTED NOT DETECTED Final   Salmonella species NOT DETECTED NOT DETECTED Final   Yersinia enterocolitica NOT DETECTED NOT DETECTED Final   Vibrio species NOT DETECTED NOT DETECTED Final   Vibrio cholerae NOT DETECTED NOT DETECTED Final   Enteroaggregative E coli (EAEC) NOT DETECTED NOT DETECTED Final   Enteropathogenic E coli (EPEC) NOT DETECTED NOT DETECTED Final   Enterotoxigenic E coli (ETEC) NOT DETECTED NOT DETECTED Final   Shiga like toxin producing E coli (STEC) NOT DETECTED NOT DETECTED Final   Shigella/Enteroinvasive E coli (EIEC) NOT DETECTED NOT DETECTED Final   Cryptosporidium NOT DETECTED NOT DETECTED Final   Cyclospora cayetanensis NOT DETECTED NOT DETECTED Final   Entamoeba histolytica NOT DETECTED NOT DETECTED Final   Giardia lamblia NOT DETECTED NOT DETECTED Final   Adenovirus F40/41 NOT DETECTED NOT DETECTED Final   Astrovirus NOT DETECTED NOT DETECTED Final   Norovirus GI/GII NOT DETECTED NOT DETECTED Final   Rotavirus A NOT DETECTED NOT DETECTED Final   Sapovirus (I, II, IV, and V) NOT DETECTED NOT DETECTED Final    Comment: Performed at [facility], [address], [place], [place] [address]     Time coordinating discharge: 40 minutes   SIGNED:   Calvin KATHEE Robson, MD  [facility] 12/24/2023, 11:25 AM Pager   If 7PM-7AM, please contact night-coverage

## 2023-12-24 NOTE — TOC Progression Note (Addendum)
 Transition of Care [facility]) - Progression Note    Patient Details  Name: Valerie Leonard MRN: [id number] Date of Birth: 1956/04/05  [facility] CM/SW Contact  Dalia GORMAN Fuse, RN Phone Number: 12/24/2023, 10:41 AM  Clinical Narrative:    TOC call from [facility], they are going to move people around. They most likely will be able to accept the patient today. He will let me know for sure later today.  TOC received call from [place] at [facility]. They can accept the patient tomorrow.  TOC spoke with the patient's sister and shared [facility] should have a bed tomorrow.   Expected Discharge Plan: Skilled Nursing Facility Barriers to Discharge: Continued Medical Work up               Expected Discharge Plan and Services In-house Referral: Clinical Social Work Discharge Planning Services: CM Consult Post Acute Care Choice: Skilled Nursing Facility Living arrangements for the past 2 months: Single Family Home                 DME Arranged: N/A DME Agency: NA       HH Arranged: NA HH Agency: NA         Social Drivers of Health (SDOH) Interventions SDOH Screenings   Food Insecurity: No Food Insecurity (12/10/2023)  Recent Concern: Food Insecurity - Food Insecurity Present (09/27/2023)  Housing: Low Risk  (12/10/2023)  Transportation Needs: No Transportation Needs (12/10/2023)  Recent Concern: Transportation Needs - Unmet Transportation Needs (09/27/2023)  Utilities: Not At Risk (12/10/2023)  Depression (PHQ2-9): Low Risk  (12/10/2023)  Financial Resource Strain: Low Risk  (10/13/2023)   Received from [facility]  Social Connections: Unknown (12/11/2023)  Recent Concern: Social Connections - Socially Isolated (09/28/2023)  Tobacco Use: Medium Risk (12/11/2023)    Readmission Risk Interventions     No data to display

## 2023-12-25 ENCOUNTER — Ambulatory Visit

## 2023-12-25 ENCOUNTER — Other Ambulatory Visit: Payer: Self-pay

## 2023-12-25 DIAGNOSIS — R6510 Systemic inflammatory response syndrome (SIRS) of non-infectious origin without acute organ dysfunction: Secondary | ICD-10-CM | POA: Diagnosis not present

## 2023-12-25 LAB — BASIC METABOLIC PANEL WITH GFR
Anion gap: 8 (ref 5–15)
BUN: 11 mg/dL (ref 8–23)
CO2: 26 mmol/L (ref 22–32)
Calcium: 8.3 mg/dL — ABNORMAL LOW (ref 8.9–10.3)
Chloride: 103 mmol/L (ref 98–111)
Creatinine, Ser: 0.36 mg/dL — ABNORMAL LOW (ref 0.44–1.00)
GFR, Estimated: >60 mL/min (ref >60)
Glucose, Bld: 97 mg/dL (ref 70–99)
Potassium: 3.8 mmol/L (ref 3.5–5.1)
Sodium: 137 mmol/L (ref 135–145)

## 2023-12-25 LAB — CBC WITH DIFFERENTIAL/PLATELET
Abs Immature Granulocytes: 0.09 K/uL — ABNORMAL HIGH (ref 0.00–0.07)
Basophils Absolute: 0.1 K/uL (ref 0.0–0.1)
Basophils Relative: 1 %
Eosinophils Absolute: 0.2 K/uL (ref 0.0–0.5)
Eosinophils Relative: 2 %
HCT: 28.7 % — ABNORMAL LOW (ref 36.0–46.0)
Hemoglobin: 8.8 g/dL — ABNORMAL LOW (ref 12.0–15.0)
Immature Granulocytes: 1 %
Lymphocytes Relative: 18 %
Lymphs Abs: 1.6 K/uL (ref 0.7–4.0)
MCH: 25.8 pg — ABNORMAL LOW (ref 26.0–34.0)
MCHC: 30.7 g/dL (ref 30.0–36.0)
MCV: 84.2 fL (ref 80.0–100.0)
Monocytes Absolute: 0.7 K/uL (ref 0.1–1.0)
Monocytes Relative: 7 %
Neutro Abs: 6.3 K/uL (ref 1.7–7.7)
Neutrophils Relative %: 71 %
Platelets: 584 K/uL — ABNORMAL HIGH (ref 150–400)
RBC: 3.41 MIL/uL — ABNORMAL LOW (ref 3.87–5.11)
RDW: 17.8 % — ABNORMAL HIGH (ref 11.5–15.5)
WBC: 8.9 K/uL (ref 4.0–10.5)
nRBC: 0 % (ref 0.0–0.2)

## 2023-12-25 LAB — RAD ONC ARIA SESSION SUMMARY
Course Elapsed Days: 7
Plan Fractions Treated to Date: 6
Plan Prescribed Dose Per Fraction: 3 Gy
Plan Total Fractions Prescribed: 10
Plan Total Prescribed Dose: 30 Gy
Reference Point Dosage Given to Date: 18 Gy
Reference Point Session Dosage Given: 3 Gy
Session Number: 6

## 2023-12-25 LAB — CULTURE, BLOOD (ROUTINE X 2)
Culture: NO GROWTH
Culture: NO GROWTH
Special Requests: ADEQUATE
Special Requests: ADEQUATE

## 2023-12-25 MED ORDER — HEPARIN SOD (PORK) LOCK FLUSH 100 UNIT/ML IV SOLN
500.0000 [IU] | Freq: Once | INTRAVENOUS | Status: AC
  Administered 2023-12-25: 500 [IU] via INTRAVENOUS
  Filled 2023-12-25: qty 5

## 2023-12-25 MED ORDER — SODIUM CHLORIDE 0.9 % IV BOLUS
500.0000 mL | Freq: Once | INTRAVENOUS | Status: AC
  Administered 2023-12-25: 500 mL via INTRAVENOUS

## 2023-12-25 NOTE — TOC Transition Note (Signed)
 Transition of Care [facility]) - Discharge Note   Patient Details  Name: Valerie Leonard MRN: [id number] Date of Birth: [birth year]  [facility]) CM/SW Contact:  Alfonso Rummer, LCSW Phone Number: 12/25/2023, 12:35 PM   Clinical Narrative:    Pt advise of discharge to [facility] today 9/19. [facility] transport is arranged. Pt sister [name] advise of pt's discharge.    Final next level of care: Skilled Nursing Facility Barriers to Discharge: Continued Medical Work up   Patient Goals and CMS Choice Patient states their goals for this hospitalization and ongoing recovery are:: SNF, discussed no preference at this time. CMS Medicare.gov Compare Post Acute Care list provided to:: Other (Comment Required) (Want to review bed offers when received.) Choice offered to / list presented to : Patient, Sibling      Discharge Placement                       Discharge Plan and Services Additional resources added to the After Visit Summary for   In-house Referral: Clinical Social Work Discharge Planning Services: CM Consult Post Acute Care Choice: Skilled Nursing Facility          DME Arranged: N/A DME Agency: NA       HH Arranged: NA HH Agency: NA        Social Drivers of Health (SDOH) Interventions SDOH Screenings   Food Insecurity: No Food Insecurity (12/10/2023)  Recent Concern: Food Insecurity - Food Insecurity Present (09/27/2023)  Housing: Low Risk  (12/10/2023)  Transportation Needs: No Transportation Needs (12/10/2023)  Recent Concern: Transportation Needs - Unmet Transportation Needs (09/27/2023)  Utilities: Not At Risk (12/10/2023)  Depression (PHQ2-9): Low Risk  (12/10/2023)  Financial Resource Strain: Low Risk  (10/13/2023)   Received from [facility]  Social Connections: Unknown (12/11/2023)  Recent Concern: Social Connections - Socially Isolated (09/28/2023)  Tobacco Use: Medium Risk (12/11/2023)     Readmission Risk Interventions      No data to display

## 2023-12-25 NOTE — Plan of Care (Signed)
  Problem: Fluid Volume: Goal: Hemodynamic stability will improve Outcome: Progressing   Problem: Clinical Measurements: Goal: Diagnostic test results will improve Outcome: Progressing Goal: Signs and symptoms of infection will decrease Outcome: Progressing   Problem: Respiratory: Goal: Ability to maintain adequate ventilation will improve Outcome: Progressing   Problem: Education: Goal: Knowledge of General Education information will improve Description: Including pain rating scale, medication(s)/side effects and non-pharmacologic comfort measures Outcome: Progressing   Problem: Health Behavior/Discharge Planning: Goal: Ability to manage health-related needs will improve Outcome: Progressing   Problem: Clinical Measurements: Goal: Ability to maintain clinical measurements within normal limits will improve Outcome: Progressing Goal: Will remain free from infection Outcome: Progressing Goal: Diagnostic test results will improve Outcome: Progressing Goal: Respiratory complications will improve Outcome: Progressing Goal: Cardiovascular complication will be avoided Outcome: Progressing   Problem: Activity: Goal: Risk for activity intolerance will decrease Outcome: Progressing   Problem: Nutrition: Goal: Adequate nutrition will be maintained Outcome: Progressing   Problem: Coping: Goal: Level of anxiety will decrease Outcome: Progressing   Problem: Elimination: Goal: Will not experience complications related to bowel motility Outcome: Progressing Goal: Will not experience complications related to urinary retention Outcome: Progressing   Problem: Pain Managment: Goal: General experience of comfort will improve and/or be controlled Outcome: Progressing   Problem: Safety: Goal: Ability to remain free from injury will improve Outcome: Progressing

## 2023-12-25 NOTE — TOC Progression Note (Signed)
 Transition of Care [facility]) - Progression Note    Patient Details  Name: Valerie Leonard MRN: [id number] Date of Birth: 06/23/1955  Transition of Care [facility]) CM/SW Contact  Alfonso Rummer, LCSW Phone Number: 12/25/2023, 10:47 AM  Clinical Narrative:    KEN DELENA Rummer spk with pt at bedside. LCSW A. Samit Sylve explained to Ms Sobotta expectation of snf physical therapy. A Eloise Picone appropriately answered Ms Iwasaki questions. LCSW A Corda Shutt spk with pt's sister [name] regarding discharge plans. Ms. Folta is in agreement to transition to [facility] room 5B.    Expected Discharge Plan: Skilled Nursing Facility Barriers to Discharge: Continued Medical Work up               Expected Discharge Plan and Services In-house Referral: Clinical Social Work Discharge Planning Services: CM Consult Post Acute Care Choice: Skilled Nursing Facility Living arrangements for the past 2 months: Single Family Home Expected Discharge Date: 12/24/23               DME Arranged: N/A DME Agency: NA       HH Arranged: NA HH Agency: NA         Social Drivers of Health (SDOH) Interventions SDOH Screenings   Food Insecurity: No Food Insecurity (12/10/2023)  Recent Concern: Food Insecurity - Food Insecurity Present (09/27/2023)  Housing: Low Risk  (12/10/2023)  Transportation Needs: No Transportation Needs (12/10/2023)  Recent Concern: Transportation Needs - Unmet Transportation Needs (09/27/2023)  Utilities: Not At Risk (12/10/2023)  Depression (PHQ2-9): Low Risk  (12/10/2023)  Financial Resource Strain: Low Risk  (10/13/2023)   Received from [facility]  Social Connections: Unknown (12/11/2023)  Recent Concern: Social Connections - Socially Isolated (09/28/2023)  Tobacco Use: Medium Risk (12/11/2023)    Readmission Risk Interventions     No data to display

## 2023-12-27 ENCOUNTER — Encounter: Payer: Self-pay | Admitting: Emergency Medicine

## 2023-12-27 ENCOUNTER — Emergency Department
Admission: EM | Admit: 2023-12-27 | Discharge: 2023-12-27 | Disposition: A | Discharge status: Home / Self Care | Source: Skilled Nursing Facility | Attending: Emergency Medicine | Admitting: Emergency Medicine

## 2023-12-27 ENCOUNTER — Other Ambulatory Visit: Payer: Self-pay

## 2023-12-27 DIAGNOSIS — R1032 Left lower quadrant pain: Secondary | ICD-10-CM | POA: Insufficient documentation

## 2023-12-27 DIAGNOSIS — Z711 Person with feared health complaint in whom no diagnosis is made: Secondary | ICD-10-CM | POA: Insufficient documentation

## 2023-12-27 DIAGNOSIS — Z8542 Personal history of malignant neoplasm of other parts of uterus: Secondary | ICD-10-CM | POA: Insufficient documentation

## 2023-12-27 LAB — URINALYSIS, ROUTINE W REFLEX MICROSCOPIC
Bacteria, UA: NONE SEEN
Bilirubin Urine: NEGATIVE
Glucose, UA: NEGATIVE mg/dL
Ketones, ur: 5 mg/dL — AB
Nitrite: NEGATIVE
Protein, ur: 100 mg/dL — AB
RBC / HPF: >50 RBC/hpf (ref 0–5)
Specific Gravity, Urine: 1.024 (ref 1.005–1.030)
Squamous Epithelial / HPF: 0 /HPF (ref 0–5)
WBC, UA: >50 WBC/hpf (ref 0–5)
pH: 5 (ref 5.0–8.0)

## 2023-12-27 LAB — COMPREHENSIVE METABOLIC PANEL WITH GFR
ALT: 24 U/L (ref 0–44)
AST: 25 U/L (ref 15–41)
Albumin: 2.5 g/dL — ABNORMAL LOW (ref 3.5–5.0)
Alkaline Phosphatase: 59 U/L (ref 38–126)
Anion gap: 10 (ref 5–15)
BUN: 11 mg/dL (ref 8–23)
CO2: 23 mmol/L (ref 22–32)
Calcium: 8.5 mg/dL — ABNORMAL LOW (ref 8.9–10.3)
Chloride: 102 mmol/L (ref 98–111)
Creatinine, Ser: 0.33 mg/dL — ABNORMAL LOW (ref 0.44–1.00)
GFR, Estimated: >60 mL/min (ref >60)
Glucose, Bld: 92 mg/dL (ref 70–99)
Potassium: 3.7 mmol/L (ref 3.5–5.1)
Sodium: 135 mmol/L (ref 135–145)
Total Bilirubin: 0.7 mg/dL (ref 0.0–1.2)
Total Protein: 7.2 g/dL (ref 6.5–8.1)

## 2023-12-27 LAB — CBC WITH DIFFERENTIAL/PLATELET
Abs Immature Granulocytes: 0.15 K/uL — ABNORMAL HIGH (ref 0.00–0.07)
Basophils Absolute: 0.1 K/uL (ref 0.0–0.1)
Basophils Relative: 1 %
Eosinophils Absolute: 0.3 K/uL (ref 0.0–0.5)
Eosinophils Relative: 2 %
HCT: 27.9 % — ABNORMAL LOW (ref 36.0–46.0)
Hemoglobin: 8.5 g/dL — ABNORMAL LOW (ref 12.0–15.0)
Immature Granulocytes: 1 %
Lymphocytes Relative: 14 %
Lymphs Abs: 1.6 K/uL (ref 0.7–4.0)
MCH: 25.4 pg — ABNORMAL LOW (ref 26.0–34.0)
MCHC: 30.5 g/dL (ref 30.0–36.0)
MCV: 83.5 fL (ref 80.0–100.0)
Monocytes Absolute: 0.8 K/uL (ref 0.1–1.0)
Monocytes Relative: 7 %
Neutro Abs: 8.2 K/uL — ABNORMAL HIGH (ref 1.7–7.7)
Neutrophils Relative %: 75 %
Platelets: 590 K/uL — ABNORMAL HIGH (ref 150–400)
RBC: 3.34 MIL/uL — ABNORMAL LOW (ref 3.87–5.11)
RDW: 18.3 % — ABNORMAL HIGH (ref 11.5–15.5)
WBC: 11 K/uL — ABNORMAL HIGH (ref 4.0–10.5)
nRBC: 0 % (ref 0.0–0.2)

## 2023-12-27 NOTE — ED Provider Notes (Signed)
 [facility] Provider Note    Event Date/Time   First MD Initiated Contact with Patient 12/27/23 1226     (approximate)   History   No chief complaint on file.   HPI  Valerie Leonard is a 68 y.o. female  with history of endometrial cancer, chronic alcohol abuse, anemia recently discharged from the hospital to [facility] healthcare, and as listed in EMR presents to the emergency department requesting to be allowed to return to her home.  She is extremely unhappy being at [facility] healthcare and does not feel that she requires their assistance.  She states that she does not intend to spend 1 more night at the facility or in a hospital.  She states that she is ambulatory with a walker at home and has a wheelchair if needed.  The presenting complaint and reason for EMS transport was reported abdominal pain and nausea however the patient reports that this is chronic and no worse than usual.  Coming to the emergency department was the only way to get out of the healthcare facility which was the only way that she was going to be able to be discharged home.    Physical Exam    Vitals:   12/27/23 1600 12/27/23 1716  BP: 122/88 118/75  Pulse: 75 80  Resp: 18 16  Temp: 98.2 F (36.8 C)   SpO2: 98% 98%    General: Awake, no distress.  CV:  Good peripheral perfusion.  Resp:  Normal effort.  Abd:  No distention. Tenderness in suprapubic area and left lower abdomen. Other:  Small amount of dark red blood noted in diaper.   ED Results / Procedures / Treatments   Labs (all labs ordered are listed, but only abnormal results are displayed)  Labs Reviewed  URINALYSIS, ROUTINE W REFLEX MICROSCOPIC - Abnormal; Notable for the following components:      Result Value   Color, Urine AMBER (*)    APPearance CLOUDY (*)    Hgb urine dipstick LARGE (*)    Ketones, ur 5 (*)    Protein, ur 100 (*)    Leukocytes,Ua TRACE (*)    Non Squamous Epithelial PRESENT (*)    All  other components within normal limits  CBC WITH DIFFERENTIAL/PLATELET - Abnormal; Notable for the following components:   WBC 11.0 (*)    RBC 3.34 (*)    Hemoglobin 8.5 (*)    HCT 27.9 (*)    MCH 25.4 (*)    RDW 18.3 (*)    Platelets 590 (*)    Neutro Abs 8.2 (*)    Abs Immature Granulocytes 0.15 (*)    All other components within normal limits  COMPREHENSIVE METABOLIC PANEL WITH GFR - Abnormal; Notable for the following components:   Creatinine, Ser 0.33 (*)    Calcium  8.5 (*)    Albumin 2.5 (*)    All other components within normal limits  URINE CULTURE     EKG  Not indicated.   RADIOLOGY  Image and radiology report reviewed and interpreted by me. Radiology report consistent with the same.  Not indicated.  PROCEDURES:  Critical Care performed: No  Procedures   MEDICATIONS ORDERED IN ED:  Medications - No data to display   IMPRESSION / MDM / ASSESSMENT AND PLAN / ED COURSE   I have reviewed the triage note and vital signs. Vital signs are stable.   Differential diagnosis includes, but is not limited to, normal mental state with chronic illness,  anemia, UTI, electrolyte imbalance  Patient's presentation is most consistent with acute complicated illness / injury requiring diagnostic workup.  68 year old female presenting to the emergency department requesting to be discharged home from her skilled nursing facility.  See HPI for further details.  On exam, the patient is alert and oriented.  She does have some suprapubic and left-sided lower abdominal tenderness but reports that this is her chronic pain related to her cancer. She reports that she continues to have vaginal bleeding, but no heavier than usual.  She is emphatic that she is going home and has family and friends that can help take care of her.  She is agreeable to the plan of checking urinalysis and basic labs to ensure that she does not currently have a urinary tract infection and that her anemia is  stable and there is no electrolyte disturbance.  She is also agreeable to ambulate with walker.  She was advised that she would have to provide her own transportation out of the emergency department if in fact she decides to leave.  No legal guardian documented in her chart and she reports that she makes her own decisions.  She is alert and oriented at this time.   Patient observed ambulating with the walker.  Awaiting urinalysis and lab results.  Clinical Course as of 12/27/23 CELESTER Repress Dec 27, 2023  1417 Patient demanding to go home. She was advised to call for a ride and telephone provided.  [CT]  1449 Patient reports sister refuses to come get her. No indication for admission. Stable anemia. Electrolytes are normal. Urinalysis likely contaminated due to vaginal bleeding. Urine culture added. She is agreeable to go back to [facility]. [CT]  1828 Continues to await transport back to facility. Patient has been calm and cooperative. She has eaten and brief changed by staff. She is resting in her room watching television. [CT]    Clinical Course User Index [CT] Valerie Sager B, FNP     FINAL CLINICAL IMPRESSION(S) / ED DIAGNOSES   Final diagnoses:  Person with feared complaint in whom no diagnosis is made     Rx / DC Orders   ED Discharge Orders     None        Note:  This document was prepared using Dragon voice recognition software and may include unintentional dictation errors.   Valerie Kirk NOVAK, FNP 12/27/23 1829    Bradler, Evan K, MD 12/27/23 [phone]

## 2023-12-27 NOTE — Discharge Instructions (Signed)
 Follow-up with outpatient providers as previously scheduled.  Return to the emergency department for new symptoms of concern.

## 2023-12-27 NOTE — ED Notes (Signed)
Pt ambulated with walker. Tolerated well.

## 2023-12-27 NOTE — ED Notes (Signed)
 RN to bedside to change brief. Pt has blood in her brief and advised that was normal as she has cancer. Pt cleaned and given sandwich tray.

## 2023-12-27 NOTE — ED Triage Notes (Signed)
 Presents via EMS from [facility] States is not happy where she is  Was just admitted there 3 days ago Having some abd pain with nausea  Afebrile on arrival

## 2023-12-27 NOTE — ED Notes (Signed)
Report called to [place] [facility] 

## 2023-12-28 ENCOUNTER — Ambulatory Visit

## 2023-12-28 LAB — URINE CULTURE: Culture: <10000 — AB

## 2023-12-29 ENCOUNTER — Other Ambulatory Visit: Payer: Self-pay

## 2023-12-29 ENCOUNTER — Ambulatory Visit
Admission: RE | Admit: 2023-12-29 | Discharge: 2023-12-29 | Disposition: A | Discharge status: Home / Self Care | Source: Ambulatory Visit | Attending: Radiation Oncology | Admitting: Radiation Oncology

## 2023-12-29 DIAGNOSIS — D72829 Elevated white blood cell count, unspecified: Secondary | ICD-10-CM | POA: Diagnosis not present

## 2023-12-29 DIAGNOSIS — C541 Malignant neoplasm of endometrium: Secondary | ICD-10-CM | POA: Diagnosis present

## 2023-12-29 DIAGNOSIS — R918 Other nonspecific abnormal finding of lung field: Secondary | ICD-10-CM | POA: Diagnosis not present

## 2023-12-29 DIAGNOSIS — M545 Low back pain, unspecified: Secondary | ICD-10-CM | POA: Diagnosis not present

## 2023-12-29 DIAGNOSIS — D509 Iron deficiency anemia, unspecified: Secondary | ICD-10-CM | POA: Diagnosis not present

## 2023-12-29 DIAGNOSIS — Z87891 Personal history of nicotine dependence: Secondary | ICD-10-CM | POA: Diagnosis not present

## 2023-12-29 DIAGNOSIS — R000 Tachycardia, unspecified: Secondary | ICD-10-CM | POA: Diagnosis not present

## 2023-12-29 DIAGNOSIS — D75839 Thrombocytosis, unspecified: Secondary | ICD-10-CM | POA: Diagnosis not present

## 2023-12-29 DIAGNOSIS — Z510 Encounter for antineoplastic radiation therapy: Secondary | ICD-10-CM | POA: Diagnosis present

## 2023-12-29 DIAGNOSIS — R109 Unspecified abdominal pain: Secondary | ICD-10-CM | POA: Diagnosis not present

## 2023-12-29 DIAGNOSIS — I959 Hypotension, unspecified: Secondary | ICD-10-CM | POA: Diagnosis not present

## 2023-12-29 DIAGNOSIS — R509 Fever, unspecified: Secondary | ICD-10-CM | POA: Diagnosis not present

## 2023-12-29 DIAGNOSIS — Z87440 Personal history of urinary (tract) infections: Secondary | ICD-10-CM | POA: Diagnosis not present

## 2023-12-29 LAB — RAD ONC ARIA SESSION SUMMARY
Course Elapsed Days: 11
Plan Fractions Treated to Date: 7
Plan Prescribed Dose Per Fraction: 3 Gy
Plan Total Fractions Prescribed: 10
Plan Total Prescribed Dose: 30 Gy
Reference Point Dosage Given to Date: 21 Gy
Reference Point Session Dosage Given: 3 Gy
Session Number: 7

## 2023-12-30 ENCOUNTER — Encounter: Payer: Self-pay | Admitting: Oncology

## 2023-12-30 ENCOUNTER — Inpatient Hospital Stay

## 2023-12-30 ENCOUNTER — Inpatient Hospital Stay (HOSPITAL_BASED_OUTPATIENT_CLINIC_OR_DEPARTMENT_OTHER): Admitting: Oncology

## 2023-12-30 ENCOUNTER — Ambulatory Visit
Admission: RE | Admit: 2023-12-30 | Discharge: 2023-12-30 | Disposition: A | Discharge status: Home / Self Care | Source: Ambulatory Visit | Attending: Radiation Oncology | Admitting: Radiation Oncology

## 2023-12-30 ENCOUNTER — Encounter: Payer: Self-pay | Admitting: Hospice and Palliative Medicine

## 2023-12-30 ENCOUNTER — Other Ambulatory Visit: Payer: Self-pay

## 2023-12-30 VITALS — BP 97/69 | HR 104 | Temp 97.0°F | Resp 19

## 2023-12-30 VITALS — BP 113/53 | HR 101 | Temp 97.8°F | Resp 18 | Ht 61.0 in | Wt 108.3 lb

## 2023-12-30 DIAGNOSIS — D649 Anemia, unspecified: Secondary | ICD-10-CM

## 2023-12-30 DIAGNOSIS — C541 Malignant neoplasm of endometrium: Secondary | ICD-10-CM

## 2023-12-30 DIAGNOSIS — D509 Iron deficiency anemia, unspecified: Secondary | ICD-10-CM

## 2023-12-30 DIAGNOSIS — Z510 Encounter for antineoplastic radiation therapy: Secondary | ICD-10-CM | POA: Diagnosis not present

## 2023-12-30 LAB — CBC WITH DIFFERENTIAL (CANCER CENTER ONLY)
Abs Immature Granulocytes: 0.11 K/uL — ABNORMAL HIGH (ref 0.00–0.07)
Basophils Absolute: 0 K/uL (ref 0.0–0.1)
Basophils Relative: 0 %
Eosinophils Absolute: 0.4 K/uL (ref 0.0–0.5)
Eosinophils Relative: 4 %
HCT: 27.4 % — ABNORMAL LOW (ref 36.0–46.0)
Hemoglobin: 8.6 g/dL — ABNORMAL LOW (ref 12.0–15.0)
Immature Granulocytes: 1 %
Lymphocytes Relative: 13 %
Lymphs Abs: 1.2 K/uL (ref 0.7–4.0)
MCH: 26.4 pg (ref 26.0–34.0)
MCHC: 31.4 g/dL (ref 30.0–36.0)
MCV: 84 fL (ref 80.0–100.0)
Monocytes Absolute: 0.6 K/uL (ref 0.1–1.0)
Monocytes Relative: 7 %
Neutro Abs: 7 K/uL (ref 1.7–7.7)
Neutrophils Relative %: 75 %
Platelet Count: 529 K/uL — ABNORMAL HIGH (ref 150–400)
RBC: 3.26 MIL/uL — ABNORMAL LOW (ref 3.87–5.11)
RDW: 19 % — ABNORMAL HIGH (ref 11.5–15.5)
WBC Count: 9.4 K/uL (ref 4.0–10.5)
nRBC: 0 % (ref 0.0–0.2)

## 2023-12-30 LAB — CMP (CANCER CENTER ONLY)
ALT: 17 U/L (ref 0–44)
AST: 22 U/L (ref 15–41)
Albumin: 2.8 g/dL — ABNORMAL LOW (ref 3.5–5.0)
Alkaline Phosphatase: 65 U/L (ref 38–126)
Anion gap: 10 (ref 5–15)
BUN: 12 mg/dL (ref 8–23)
CO2: 23 mmol/L (ref 22–32)
Calcium: 8.7 mg/dL — ABNORMAL LOW (ref 8.9–10.3)
Chloride: 102 mmol/L (ref 98–111)
Creatinine: 0.47 mg/dL (ref 0.44–1.00)
GFR, Estimated: >60 mL/min (ref >60)
Glucose, Bld: 88 mg/dL (ref 70–99)
Potassium: 3.3 mmol/L — ABNORMAL LOW (ref 3.5–5.1)
Sodium: 135 mmol/L (ref 135–145)
Total Bilirubin: 0.6 mg/dL (ref 0.0–1.2)
Total Protein: 8 g/dL (ref 6.5–8.1)

## 2023-12-30 LAB — RAD ONC ARIA SESSION SUMMARY
Course Elapsed Days: 12
Plan Fractions Treated to Date: 8
Plan Prescribed Dose Per Fraction: 3 Gy
Plan Total Fractions Prescribed: 10
Plan Total Prescribed Dose: 30 Gy
Reference Point Dosage Given to Date: 24 Gy
Reference Point Session Dosage Given: 3 Gy
Session Number: 8

## 2023-12-30 LAB — TSH: TSH: 1.36 u[IU]/mL (ref 0.350–4.500)

## 2023-12-30 MED ORDER — IRON SUCROSE 20 MG/ML IV SOLN
200.0000 mg | INTRAVENOUS | Status: DC
  Administered 2023-12-30: 200 mg via INTRAVENOUS
  Filled 2023-12-30: qty 10

## 2023-12-30 MED ORDER — OXYCODONE HCL 5 MG PO TABS
5.0000 mg | ORAL_TABLET | ORAL | 0 refills | Status: AC | PRN
  Filled 2023-12-30: qty 60, 10d supply, fill #0

## 2023-12-30 NOTE — Progress Notes (Signed)
 Hematology/Oncology Consult note [facility]  [phone] [phone]  Patient Care Team: Pcp, No as PCP - General Maurie Rayfield BIRCH, RN as Oncology Nurse Navigator Melanee Annah BROCKS, MD as Consulting Physician (Oncology)   Name of the patient: Valerie Leonard  [id number]  May 20, 1955   Date of visit: 12/30/23  Diagnosis-  Cancer Staging  Endometrial cancer [facility]) Staging form: Corpus Uteri - Carcinoma and Carcinosarcoma, AJCC 8th Edition and FIGO 2023 - Clinical stage from 12/02/2023: FIGO Stage IIIC1, calculated as Stage Unknown (cTX, cN1a, cM0) - Signed by Melanee Annah BROCKS, MD on 12/02/2023 Histologic grade (G): G2 Histologic grading system: 3 grade system - Pathologic stage from 12/02/2023: FIGO Stage IIIC - Unsigned Histopathologic type: Endometrioid adenocarcinoma, NOS Stage prefix: Initial diagnosis    Chief complaint/ Reason for visit-discuss further management of endometrial carcinoma  Heme/Onc history: Patient is a 68 year old female who presented with post menopausal bleeding in June 2025.  Her hemoglobin had dropped down to 5.8 requiring PRBC transfusion.   Pelvic ultrasound in June 2025 showed large amount of blood products throughout the endometrial panel.  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  Wild-type staining for p53.  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  Enlarged bilateral iliac sided chain and pelvic sidewall lymph nodes compatible with nodal disease involvement.  Scattered pulmonary nodules measuring up to 6 mm nonspecific but  suspicious for metastatic disease.  Patient began to have ongoing vaginal bleeding and therefore is undergoing palliative radiation  Interval history-she is currently residing in [place] care house and will likely be there for at least a couple of more weeks.  She completes palliative radiation this week.  She reports abdominal pain and low back pain  ECOG PS- 2 Pain scale- 3 Opioid associated constipation- no  Review of systems- Review of Systems  Constitutional:  Negative for chills, fever, malaise/fatigue and weight loss.  HENT:  Negative for congestion, ear discharge and nosebleeds.   Eyes:  Negative for blurred vision.  Respiratory:  Negative for cough, hemoptysis, sputum production, shortness of breath and wheezing.   Cardiovascular:  Negative for chest pain, palpitations, orthopnea and claudication.  Gastrointestinal:  Negative for abdominal pain, blood in stool, constipation, diarrhea, heartburn, melena, nausea and vomiting.  Genitourinary:  Negative for dysuria, flank pain, frequency, hematuria and urgency.  Musculoskeletal:  Negative for back pain, joint pain and myalgias.  Skin:  Negative for rash.  Neurological:  Negative for dizziness, tingling, focal weakness, seizures, weakness and headaches.  Endo/Heme/Allergies:  Does not bruise/bleed easily.  Psychiatric/Behavioral:  Negative for depression and suicidal ideas. The patient does not have insomnia.       No Known Allergies   Past Medical History:  Diagnosis Date   Acute delirium    Acute metabolic encephalopathy    Anemia    blood loss anemia   Chronic alcohol abuse    Depression    Endometrial cancer (HCC)    Hypokalemia    Hypomagnesemia    Pressure injury of skin    Severe major depression, single episode, without psychotic features (HCC)    Vaginal bleeding      Past Surgical  History:  Procedure Laterality Date   IR IMAGING GUIDED PORT INSERTION  12/17/2023   IR PATIENT EVAL TECH 0-60 MINS  12/10/2023    NO PAST SURGERIES      Social History   Socioeconomic History   Marital status: Single    Spouse name: Not on file   Number of children: 0   Years of education: Not on file   Highest education level: Not on file  Occupational History   Not on file  Tobacco Use   Smoking status: Former    Types: Cigarettes, Cigars   Smokeless tobacco: Never  Vaping Use   Vaping status: Never Used  Substance and Sexual Activity   Alcohol use: Yes    Alcohol/week: 3.0 standard drinks of alcohol    Types: 3 Cans of beer per week    Comment: More than 4 glasses of wine, mixed drinks, beer daily.    Drug use: Not Currently    Comment: former use of marijuana   Sexual activity: Not Currently    Birth control/protection: None  Other Topics Concern   Not on file  Social History Narrative   Lives by herself    Social Drivers of Health   Financial Resource Strain: Low Risk  (10/13/2023)   Received from [facility]   Overall Financial Resource Strain (CARDIA)    Difficulty of Paying Living Expenses: Not very hard  Food Insecurity: No Food Insecurity (12/10/2023)   Hunger Vital Sign    Worried About Running Out of Food in the Last Year: Never true    Ran Out of Food in the Last Year: Never true  Recent Concern: Food Insecurity - Food Insecurity Present (09/27/2023)   Hunger Vital Sign    Worried About Running Out of Food in the Last Year: Sometimes true    Ran Out of Food in the Last Year: Sometimes true  Transportation Needs: No Transportation Needs (12/10/2023)   PRAPARE - Administrator, Civil Service (Medical): No    Lack of Transportation (Non-Medical): No  Recent Concern: Transportation Needs - Unmet Transportation Needs (09/27/2023)   PRAPARE - Administrator, Civil Service (Medical): Yes    Lack of Transportation (Non-Medical): Yes  Physical Activity: Not on file  Stress: Not on file  Social Connections: Unknown (12/11/2023)   Social Connection and  Isolation Panel    Frequency of Communication with Friends and Family: More than three times a week    Frequency of Social Gatherings with Friends and Family: More than three times a week    Attends Religious Services: Patient declined    Database administrator or Organizations: Patient declined    Attends Banker Meetings: Patient declined    Marital Status: Widowed  Recent Concern: Social Connections - Socially Isolated (09/28/2023)   Social Connection and Isolation Panel    Frequency of Communication with Friends and Family: Never    Frequency of Social Gatherings with Friends and Family: Never    Attends Religious Services: Never    Database administrator or Organizations: No    Attends Banker Meetings: Never    Marital Status: Widowed  Intimate Partner Violence: Not At Risk (12/10/2023)   Humiliation, Afraid, Rape, and Kick questionnaire    Fear of Current or Ex-Partner: No    Emotionally Abused: No    Physically Abused: No    Sexually Abused: No    History reviewed. No pertinent family history.  Current Outpatient Medications:    ALPRAZolam  (XANAX ) 0.25 MG tablet, Take 1 tablet (0.25 mg total) by mouth 2 (two) times daily as needed for anxiety., Disp: 10 tablet, Rfl: 0   amoxicillin -clavulanate (AUGMENTIN ) 500-125 MG tablet, Take 1 tablet by mouth 2 (two) times daily., Disp: , Rfl:    diphenhydrAMINE  (BENADRYL ) 50 MG capsule, Take 1 capsule (50 mg total) by mouth at bedtime as needed for itching, allergies or sleep., Disp: , Rfl:    feeding supplement (ENSURE PLUS HIGH PROTEIN) LIQD, Take 237 mLs by mouth 3 (three) times daily between meals., Disp: , Rfl:    ferrous sulfate  325 (65 FE) MG tablet, Take 1 tablet (325 mg total) by mouth daily with breakfast., Disp: , Rfl:    lidocaine -prilocaine  (EMLA ) cream, Apply to affected area once, Disp: 30 g, Rfl: 3   Multiple Vitamin (MULTIVITAMIN WITH MINERALS) TABS tablet, Take 1 tablet by mouth daily., Disp: 30  tablet, Rfl: 1   ondansetron  (ZOFRAN ) 8 MG tablet, Take 1 tablet (8 mg total) by mouth every 8 (eight) hours as needed for nausea or vomiting. Start on the third day after carboplatin., Disp: 30 tablet, Rfl: 1   oxyCODONE  (OXY IR/ROXICODONE ) 5 MG immediate release tablet, Take 1 tablet (5 mg total) by mouth every 4 (four) hours as needed for severe pain (pain score 7-10)., Disp: 60 tablet, Rfl: 0   prochlorperazine  (COMPAZINE ) 10 MG tablet, Take 1 tablet (10 mg total) by mouth every 6 (six) hours as needed for nausea or vomiting., Disp: 30 tablet, Rfl: 1   traZODone (DESYREL) 50 MG tablet, Take 50 mg by mouth at bedtime., Disp: , Rfl:    [Paused] dexamethasone  (DECADRON ) 4 MG tablet, Take 2 tablets (8mg ) by mouth daily starting the day after carboplatin for 3 days. Take with food, Disp: 30 tablet, Rfl: 1   escitalopram  (LEXAPRO ) 5 MG tablet, Take 1 tablet every day by oral route in the morning, for depression. (Patient not taking: Reported on 12/02/2023), Disp: , Rfl:    folic acid  (FOLVITE ) 1 MG tablet, Take 1 tablet (1 mg total) by mouth daily. (Patient not taking: Reported on 11/04/2023), Disp: 30 tablet, Rfl: 0 No current facility-administered medications for this visit.  Facility-Administered Medications Ordered in Other Visits:    iron  sucrose (VENOFER ) injection 200 mg, 200 mg, Intravenous, Weekly, Nestor Wieneke C, MD, 200 mg at 12/30/23 1020  Physical exam:  Vitals:   12/30/23 0943  BP: (!) 113/53  Pulse: (!) 101  Resp: 18  Temp: 97.8 F (36.6 C)  TempSrc: Tympanic  SpO2: 100%  Weight: 108 lb 4.8 oz (49.1 kg)  Height: 5' 1 (1.549 m)   Physical Exam Constitutional:      Comments: She is frail appearing.  Sitting in a wheelchair  Cardiovascular:     Rate and Rhythm: Normal rate and regular rhythm.     Heart sounds: Normal heart sounds.  Pulmonary:     Effort: Pulmonary effort is normal.     Breath sounds: Normal breath sounds.  Skin:    General: Skin is warm and dry.   Neurological:     Mental Status: She is alert and oriented to person, place, and time.      I have personally reviewed labs listed below:    Latest Ref Rng & Units 12/30/2023    8:22 AM  CMP  Glucose 70 - 99 mg/dL 88   BUN 8 - 23 mg/dL 12   Creatinine 9.55 - 1.00 mg/dL 9.52  Sodium 135 - 145 mmol/L 135   Potassium 3.5 - 5.1 mmol/L 3.3   Chloride 98 - 111 mmol/L 102   CO2 22 - 32 mmol/L 23   Calcium  8.9 - 10.3 mg/dL 8.7   Total Protein 6.5 - 8.1 g/dL 8.0   Total Bilirubin 0.0 - 1.2 mg/dL 0.6   Alkaline Phos 38 - 126 U/L 65   AST 15 - 41 U/L 22   ALT 0 - 44 U/L 17       Latest Ref Rng & Units 12/30/2023    8:22 AM  CBC  WBC 4.0 - 10.5 K/uL 9.4   Hemoglobin 12.0 - 15.0 g/dL 8.6   Hematocrit 63.9 - 46.0 % 27.4   Platelets 150 - 400 K/uL 529    I have personally reviewed Radiology images listed below: No images are attached to the encounter.  IR IMAGING GUIDED PORT INSERTION Result Date: 12/17/2023 INDICATION: Port-A-Cath needed for treatment of endometrial cancer. EXAM: FLUOROSCOPIC AND ULTRASOUND GUIDED PLACEMENT OF A SUBCUTANEOUS PORT MEDICATIONS: Moderate sedation ANESTHESIA/SEDATION: Moderate (conscious) sedation was employed during this procedure. A total of Versed  2 mg and fentanyl  100 mcg was administered intravenously at the order of the provider performing the procedure. Total intra-service moderate sedation time: 24 minutes. Patient's level of consciousness and vital signs were monitored continuously by radiology nurse throughout the procedure under the supervision of the provider performing the procedure. FLUOROSCOPY TIME:  Radiation Exposure Index (as provided by the fluoroscopic device): 1 mGy Kerma COMPLICATIONS: None immediate. PROCEDURE: The procedure, risks, benefits, and alternatives were explained to the patient. Questions regarding the procedure were encouraged and answered. The patient understands and consents to the procedure. Patient was placed supine on the  interventional table. Ultrasound confirmed a patent right internal jugular vein. Ultrasound image was saved for documentation. The right chest and neck were cleaned with a skin antiseptic and a sterile drape was placed. Maximal barrier sterile technique was utilized including caps, mask, sterile gowns, sterile gloves, sterile drape, hand hygiene and skin antiseptic. The right neck was anesthetized with 1% lidocaine . Small incision was made in the right neck with a blade. Micropuncture set was placed in the right internal jugular vein with ultrasound guidance. The micropuncture wire was used for measurement purposes. The right chest was anesthetized with 1% lidocaine  with epinephrine . #15 blade was used to make an incision and a subcutaneous port pocket was formed. 8 french Power Port was assembled. Subcutaneous tunnel was formed with a stiff tunneling device. The port catheter was brought through the subcutaneous tunnel. The port was placed in the subcutaneous pocket. The micropuncture set was exchanged for a peel-away sheath. The catheter was placed through the peel-away sheath and the tip was positioned at the superior cavoatrial junction. Catheter placement was confirmed with fluoroscopy. The port was accessed and flushed with heparinized saline. The port pocket was closed using two layers of absorbable sutures and Dermabond. The vein skin site was closed using a single layer of absorbable suture and Dermabond. Sterile dressings were applied. Patient tolerated the procedure well without an immediate complication. Ultrasound and fluoroscopic images were taken and saved for this procedure. IMPRESSION: Placement of a subcutaneous power-injectable port device. Catheter tip at the superior cavoatrial junction. Electronically Signed   By: Juliene Balder M.D.   On: 12/17/2023 16:47   CT ABDOMEN PELVIS WO CONTRAST Result Date: 12/14/2023 EXAM: CT ABDOMEN AND PELVIS WITHOUT CONTRAST 12/14/2023 08:34:03 PM TECHNIQUE: CT of  the abdomen and pelvis was performed without the  administration of intravenous contrast. Multiplanar reformatted images are provided for review. Automated exposure control, iterative reconstruction, and/or weight-based adjustment of the mA/kV was utilized to reduce the radiation dose to as low as reasonably achievable. COMPARISON: 11/06/2023 CLINICAL HISTORY: Ureteral cancer, monitor; ? uterine infection. Has sepsis and unknown source. FINDINGS: LOWER CHEST: No acute abnormality. Unchanged 6 mm subpleural nodule in parenchyma (series 4, image 3). LIVER: The liver is unremarkable. GALLBLADDER AND BILE DUCTS: Gallbladder is unremarkable. No biliary ductal dilatation. SPLEEN: No acute abnormality. PANCREAS: No acute abnormality. ADRENAL GLANDS: No acute abnormality. KIDNEYS, URETERS AND BLADDER: No stones in the kidneys or ureters. No hydronephrosis. No perinephric or periureteral stranding. Urinary bladder is unremarkable. GI AND BOWEL: Stomach demonstrates no acute abnormality. There is no bowel obstruction. PERITONEUM AND RETROPERITONEUM: No ascites. No free air. VASCULATURE: Aortic atherosclerotic calcification. LYMPH NODES: Interval increase in size of multiple retroperitoneal lymph nodes. For example, a left periaortic lymph node measuring 10 mm, following series 2, image 35, previously measured 7 mm. A 20 mm right external iliac node on series 2, image 63 previously measured 18 mm. A 16 mm left external iliac node on series 263, previously measured 14 mm. REPRODUCTIVE ORGANS: Evaluation of the known mass in the uterus is limited without IV contrast. There is mass-like expansion centered in the lower uterine segment with heterogeneous fluid in the uterine fundus. Findings are similar to CT dated 11/06/23, given the difference is in technique. 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 No acute osseous abnormality. No focal soft tissue abnormality. IMPRESSION: 1. Limited evaluation of the known uterine  mass without IV contrast. Findings are similar to prior CT with contrast dated 11/06/23, given the difference in technique. 2. Interval increase in size of multiple retroperitoneal lymph nodes. Electronically signed by: Norman Gatlin MD 12/14/2023 09:05 PM EDT RP Workstation: [id number]   DG Chest Port 1 View Result Date: 12/10/2023 CLINICAL DATA:  Infection. EXAM: PORTABLE CHEST 1 VIEW COMPARISON:  11/22/2023. FINDINGS: The heart size and mediastinal contours are unchanged. No focal consolidation, pleural effusion, or pneumothorax. No acute osseous abnormality. IMPRESSION: No acute cardiopulmonary findings. Electronically Signed   By: Harrietta Sherry M.D.   On: 12/10/2023 16:20   IR PATIENT EVAL TECH 0-60 MINS Result Date: 12/10/2023 Delight Bebe SAILOR     12/10/2023 12:32 PM Procedure cancelled order used for wasted supplies    Assessment and plan- Patient is a 68 y.o. female with history of grade 2 endometrioid endometrial carcinoma FIGO stage III C1 TX N1a M0 here to discuss further management  Patient is supposed to start chemotherapy today.  However she is still completing her radiation which she will in the next couple of days.  I am planning to defer her chemotherapy start by 10 days time.  She will be receiving carboplatin at reduced AUC 4 dosing along with Taxol at 135 mg/m and based on her tolerance we will decide if we can go up on the dose.  White cell count is presently normal.  She does have ongoing anemia likely secondary to chronic disease as well as iron  deficiency from vaginal bleeding.  We will give her her fifth dose of Venofer  today.  Thrombocytosis likely reactive   Visit Diagnosis 1. Endometrial cancer (HCC)   2. Iron  deficiency anemia, unspecified iron  deficiency anemia type      Dr. Annah Skene, MD, MPH [facility] [phone] 12/30/2023 12:49 PM

## 2023-12-30 NOTE — Progress Notes (Signed)
 Patient still having some bleeding. She also has a little bit of a headache and would like some tylenol  if it can be given while she is here in clinic.

## 2023-12-31 ENCOUNTER — Ambulatory Visit
Admission: RE | Admit: 2023-12-31 | Discharge: 2023-12-31 | Disposition: A | Discharge status: Home / Self Care | Source: Ambulatory Visit | Attending: Radiation Oncology | Admitting: Radiation Oncology

## 2023-12-31 ENCOUNTER — Encounter: Payer: Self-pay | Admitting: Oncology

## 2023-12-31 ENCOUNTER — Other Ambulatory Visit: Payer: Self-pay

## 2023-12-31 DIAGNOSIS — Z510 Encounter for antineoplastic radiation therapy: Secondary | ICD-10-CM | POA: Diagnosis not present

## 2023-12-31 LAB — RAD ONC ARIA SESSION SUMMARY
Course Elapsed Days: 13
Plan Fractions Treated to Date: 9
Plan Prescribed Dose Per Fraction: 3 Gy
Plan Total Fractions Prescribed: 10
Plan Total Prescribed Dose: 30 Gy
Reference Point Dosage Given to Date: 27 Gy
Reference Point Session Dosage Given: 3 Gy
Session Number: 9

## 2023-12-31 LAB — T4: T4, Total: 7.5 ug/dL (ref 4.5–12.0)

## 2024-01-01 ENCOUNTER — Other Ambulatory Visit: Payer: Self-pay

## 2024-01-01 ENCOUNTER — Ambulatory Visit
Admission: RE | Admit: 2024-01-01 | Discharge: 2024-01-01 | Disposition: A | Discharge status: Home / Self Care | Source: Ambulatory Visit | Attending: Radiation Oncology | Admitting: Radiation Oncology

## 2024-01-01 ENCOUNTER — Ambulatory Visit

## 2024-01-01 DIAGNOSIS — Z510 Encounter for antineoplastic radiation therapy: Secondary | ICD-10-CM | POA: Diagnosis not present

## 2024-01-01 LAB — RAD ONC ARIA SESSION SUMMARY
Course Elapsed Days: 14
Plan Fractions Treated to Date: 10
Plan Prescribed Dose Per Fraction: 3 Gy
Plan Total Fractions Prescribed: 10
Plan Total Prescribed Dose: 30 Gy
Reference Point Dosage Given to Date: 30 Gy
Reference Point Session Dosage Given: 3 Gy
Session Number: 10

## 2024-01-04 ENCOUNTER — Ambulatory Visit

## 2024-01-04 NOTE — Radiation Completion Notes (Signed)
 Patient Name: Valerie Leonard, Valerie Leonard MRN: [id number] Date of Birth: 1956/03/24 Referring Physician:  , M.D. Date of Service: 2024-01-04 Radiation Oncologist: Marcey Penton, M.D. [facility]                             RADIATION ONCOLOGY END OF TREATMENT NOTE     Diagnosis: C54.1 Malignant neoplasm of endometrium Staging on 2023-12-02: Endometrial cancer (HCC) T=cTX, N=cN1a, M=cM0 Intent: Curative     HPI: Patient is a 68 year old female seen in her hospital bed.  She was seen yesterday in GYN clinic by Dr. Elby.  Patient has known stage IIIc endometrioid endometrial carcinoma and was scheduled to start CarboTaxol chemotherapy this week with medical oncology.  She went for port placement was noted to be febrile.  She had a leukocytosis with a white blood cell count of 23 hypotensive and febrile to 102.  She received IV antibiotics including cefepime  and Flagyl .  CT scan was limited be secondary to lack of IV contrast showing a persistent mass in the uterus with masslike expansion in the lower uterine segment with heterogeneous fluid in the right uterine fundus.  Based on her bleeding yesterday time of examination I was requested to evaluate the patient for emergent radiation therapy to assist with addressing her vaginal bleeding.  Seen today in her hospital bedside she is doing fairly well she states she is having some slight cramping in her abdomen.  She otherwise is without complaint.      ==========DELIVERED PLANS==========  First Treatment Date: 2023-12-18 Last Treatment Date: 2024-01-01   Plan Name: Pelvis Site: Pelvis Technique: 3D Mode: Photon Dose Per Fraction: 3 Gy Prescribed Dose (Delivered / Prescribed): 30 Gy / 30 Gy Prescribed Fxs (Delivered / Prescribed): 10 / 10     ==========ON TREATMENT VISIT DATES========== 2023-12-22, 2023-12-29     ==========UPCOMING VISITS========== 02/03/2024 [facility] RAD ONCOLOGY FOLLOW UP 30 Penton Marcey,  MD  01/08/2024 [facility] MED ONC INFUSION [facility] INFUSION CHAIR 9  01/08/2024 [facility] MED ONC EST PT Melanee Annah BROCKS, MD  01/08/2024 [facility] MED ONC INF PORT FLUSH W/LAB [facility]-PORT FLUSH        ==========APPENDIX - ON TREATMENT VISIT NOTES==========   See weekly On Treatment Notes in Epic for details in the Media tab (listed as Progress notes on the On Treatment Visit Dates listed above).

## 2024-01-07 ENCOUNTER — Encounter: Payer: Self-pay | Admitting: Oncology

## 2024-01-08 ENCOUNTER — Inpatient Hospital Stay: Attending: Obstetrics and Gynecology

## 2024-01-08 ENCOUNTER — Encounter: Payer: Self-pay | Admitting: Oncology

## 2024-01-08 ENCOUNTER — Telehealth: Payer: Self-pay

## 2024-01-08 ENCOUNTER — Inpatient Hospital Stay

## 2024-01-08 ENCOUNTER — Inpatient Hospital Stay (HOSPITAL_BASED_OUTPATIENT_CLINIC_OR_DEPARTMENT_OTHER): Admitting: Oncology

## 2024-01-08 VITALS — BP 98/61 | HR 85 | Temp 96.4°F | Resp 18 | Wt 107.9 lb

## 2024-01-08 DIAGNOSIS — D509 Iron deficiency anemia, unspecified: Secondary | ICD-10-CM

## 2024-01-08 DIAGNOSIS — Z5112 Encounter for antineoplastic immunotherapy: Secondary | ICD-10-CM | POA: Insufficient documentation

## 2024-01-08 DIAGNOSIS — Z5111 Encounter for antineoplastic chemotherapy: Secondary | ICD-10-CM

## 2024-01-08 DIAGNOSIS — C541 Malignant neoplasm of endometrium: Secondary | ICD-10-CM | POA: Diagnosis not present

## 2024-01-08 DIAGNOSIS — Z87891 Personal history of nicotine dependence: Secondary | ICD-10-CM | POA: Diagnosis not present

## 2024-01-08 DIAGNOSIS — Z923 Personal history of irradiation: Secondary | ICD-10-CM | POA: Diagnosis not present

## 2024-01-08 DIAGNOSIS — Z79620 Long term (current) use of immunosuppressive biologic: Secondary | ICD-10-CM | POA: Insufficient documentation

## 2024-01-08 LAB — CBC WITH DIFFERENTIAL (CANCER CENTER ONLY)
Abs Immature Granulocytes: 0.02 K/uL (ref 0.00–0.07)
Basophils Absolute: 0.1 K/uL (ref 0.0–0.1)
Basophils Relative: 1 %
Eosinophils Absolute: 0.4 K/uL (ref 0.0–0.5)
Eosinophils Relative: 7 %
HCT: 29.3 % — ABNORMAL LOW (ref 36.0–46.0)
Hemoglobin: 9.1 g/dL — ABNORMAL LOW (ref 12.0–15.0)
Immature Granulocytes: 0 %
Lymphocytes Relative: 27 %
Lymphs Abs: 1.4 K/uL (ref 0.7–4.0)
MCH: 27.2 pg (ref 26.0–34.0)
MCHC: 31.1 g/dL (ref 30.0–36.0)
MCV: 87.7 fL (ref 80.0–100.0)
Monocytes Absolute: 0.5 K/uL (ref 0.1–1.0)
Monocytes Relative: 10 %
Neutro Abs: 2.9 K/uL (ref 1.7–7.7)
Neutrophils Relative %: 55 %
Platelet Count: 325 K/uL (ref 150–400)
RBC: 3.34 MIL/uL — ABNORMAL LOW (ref 3.87–5.11)
RDW: 21.3 % — ABNORMAL HIGH (ref 11.5–15.5)
WBC Count: 5.2 K/uL (ref 4.0–10.5)
nRBC: 0 % (ref 0.0–0.2)

## 2024-01-08 LAB — CMP (CANCER CENTER ONLY)
ALT: 9 U/L (ref 0–44)
AST: 21 U/L (ref 15–41)
Albumin: 2.6 g/dL — ABNORMAL LOW (ref 3.5–5.0)
Alkaline Phosphatase: 66 U/L (ref 38–126)
Anion gap: 5 (ref 5–15)
BUN: 10 mg/dL (ref 8–23)
CO2: 26 mmol/L (ref 22–32)
Calcium: 8.4 mg/dL — ABNORMAL LOW (ref 8.9–10.3)
Chloride: 107 mmol/L (ref 98–111)
Creatinine: 0.47 mg/dL (ref 0.44–1.00)
GFR, Estimated: >60 mL/min (ref >60)
Glucose, Bld: 97 mg/dL (ref 70–99)
Potassium: 3.5 mmol/L (ref 3.5–5.1)
Sodium: 138 mmol/L (ref 135–145)
Total Bilirubin: 0.4 mg/dL (ref 0.0–1.2)
Total Protein: 7.2 g/dL (ref 6.5–8.1)

## 2024-01-08 LAB — TSH: TSH: 0.637 u[IU]/mL (ref 0.350–4.500)

## 2024-01-08 MED ORDER — DEXAMETHASONE SODIUM PHOSPHATE 10 MG/ML IJ SOLN
10.0000 mg | Freq: Once | INTRAMUSCULAR | Status: AC
  Administered 2024-01-08: 10 mg via INTRAVENOUS
  Filled 2024-01-08: qty 1

## 2024-01-08 MED ORDER — SODIUM CHLORIDE 0.9 % IV SOLN
200.0000 mg | Freq: Once | INTRAVENOUS | Status: AC
  Administered 2024-01-08: 200 mg via INTRAVENOUS
  Filled 2024-01-08: qty 8

## 2024-01-08 MED ORDER — APREPITANT 130 MG/18ML IV EMUL
130.0000 mg | Freq: Once | INTRAVENOUS | Status: AC
  Administered 2024-01-08: 130 mg via INTRAVENOUS
  Filled 2024-01-08: qty 18

## 2024-01-08 MED ORDER — DIPHENHYDRAMINE HCL 50 MG/ML IJ SOLN
50.0000 mg | Freq: Once | INTRAMUSCULAR | Status: AC
  Administered 2024-01-08: 50 mg via INTRAVENOUS
  Filled 2024-01-08: qty 1

## 2024-01-08 MED ORDER — PALONOSETRON HCL INJECTION 0.25 MG/5ML
0.2500 mg | Freq: Once | INTRAVENOUS | Status: AC
  Administered 2024-01-08: 0.25 mg via INTRAVENOUS
  Filled 2024-01-08: qty 5

## 2024-01-08 MED ORDER — SODIUM CHLORIDE 0.9 % IV SOLN
135.0000 mg/m2 | Freq: Once | INTRAVENOUS | Status: AC
  Administered 2024-01-08: 198 mg via INTRAVENOUS
  Filled 2024-01-08: qty 33

## 2024-01-08 MED ORDER — SODIUM CHLORIDE 0.9 % IV SOLN
INTRAVENOUS | Status: DC
  Filled 2024-01-08: qty 250

## 2024-01-08 MED ORDER — SODIUM CHLORIDE 0.9 % IV SOLN
269.2000 mg | Freq: Once | INTRAVENOUS | Status: AC
  Administered 2024-01-08: 270 mg via INTRAVENOUS
  Filled 2024-01-08: qty 27

## 2024-01-08 MED ORDER — FAMOTIDINE IN NACL 20-0.9 MG/50ML-% IV SOLN
20.0000 mg | Freq: Once | INTRAVENOUS | Status: AC
  Administered 2024-01-08: 20 mg via INTRAVENOUS
  Filled 2024-01-08: qty 50

## 2024-01-08 NOTE — Telephone Encounter (Signed)
 Per Dr. Melanee patient was placed on Augmentin  due to fever of unknown origin. While hospitalized tests and cultures did not show any growth.  Dr. Melanee is okay with discontinuing Augmentin  at this time. Outbound call; spoke to [facility] (patient resides in room 7A); will review chart but indicated if the Augmentin  was started by an in house provider it will need to be continued.  If it was carried over from the hospital they'll make note to discontinue.

## 2024-01-08 NOTE — Progress Notes (Signed)
 Hematology/Oncology Consult note [facility]  [phone] [phone]  Patient Care Team: Pcp, No as PCP - General Maurie Rayfield BIRCH, RN as Oncology Nurse Navigator Melanee Annah BROCKS, MD as Consulting Physician (Oncology)   Name of the patient: Valerie Leonard  [id number]  07/01/1955   Date of visit: 01/08/24  Diagnosis-  Cancer Staging  Endometrial cancer [facility]) Staging form: Corpus Uteri - Carcinoma and Carcinosarcoma, AJCC 8th Edition and FIGO 2023 - Clinical stage from 12/02/2023: FIGO Stage IIIC1, calculated as Stage Unknown (cTX, cN1a, cM0) - Signed by Melanee Annah BROCKS, MD on 12/02/2023 Histologic grade (G): G2 Histologic grading system: 3 grade system - Pathologic stage from 12/02/2023: FIGO Stage IIIC - Unsigned Histopathologic type: Endometrioid adenocarcinoma, NOS Stage prefix: Initial diagnosis    Chief complaint/ Reason for visit-on treatment assessment prior to cycle 1 of CarboTaxol chemotherapy  Heme/Onc history: Patient is a 68 year old female who presented with post menopausal bleeding in June 2025.  Her hemoglobin had dropped down to 5.8 requiring PRBC transfusion.   Pelvic ultrasound in June 2025 showed large amount of blood products throughout the endometrial panel.  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  Wild-type staining for p53.  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  Enlarged bilateral iliac sided chain and pelvic sidewall lymph nodes compatible with nodal disease involvement.  Scattered pulmonary nodules measuring up to 6 mm  nonspecific but suspicious for metastatic disease.   Patient completed a course of palliative radiation to her vagina  Interval history-she has completed radiation treatment and tolerated it well.  Denies any vaginal bleeding presently and overall feels better.  Denies any fever  ECOG PS- 2 Pain scale- 0   Review of systems- Review of Systems  Constitutional:  Positive for malaise/fatigue. Negative for chills, fever and weight loss.  HENT:  Negative for congestion, ear discharge and nosebleeds.   Eyes:  Negative for blurred vision.  Respiratory:  Negative for cough, hemoptysis, sputum production, shortness of breath and wheezing.   Cardiovascular:  Negative for chest pain, palpitations, orthopnea and claudication.  Gastrointestinal:  Negative for abdominal pain, blood in stool, constipation, diarrhea, heartburn, melena, nausea and vomiting.  Genitourinary:  Negative for dysuria, flank pain, frequency, hematuria and urgency.  Musculoskeletal:  Negative for back pain, joint pain and myalgias.  Skin:  Negative for rash.  Neurological:  Negative for dizziness, tingling, focal weakness, seizures, weakness and headaches.  Endo/Heme/Allergies:  Does not bruise/bleed easily.  Psychiatric/Behavioral:  Negative for depression and suicidal ideas. The patient does not have insomnia.       No Known Allergies   Past Medical History:  Diagnosis Date   Acute delirium    Acute metabolic encephalopathy    Anemia    blood loss anemia   Chronic alcohol abuse    Depression    Endometrial cancer (HCC)    Hypokalemia    Hypomagnesemia    Pressure injury of skin    Severe major depression, single episode, without psychotic features (HCC)    Vaginal bleeding      Past Surgical History:  Procedure Laterality Date   IR IMAGING GUIDED PORT INSERTION  12/17/2023   IR PATIENT EVAL TECH 0-60 MINS  12/10/2023   NO PAST SURGERIES      Social History   Socioeconomic History   Marital status:  Single    Spouse name: Not on file   Number of children: 0   Years of education: Not on file   Highest education level: Not on file  Occupational History   Not on file  Tobacco Use   Smoking status: Former    Types: Cigarettes, Cigars   Smokeless tobacco: Never  Vaping Use   Vaping status: Never Used  Substance and Sexual Activity   Alcohol use: Yes    Alcohol/week: 3.0 standard drinks of alcohol    Types: 3 Cans of beer per week    Comment: More than 4 glasses of wine, mixed drinks, beer daily.    Drug use: Not Currently    Comment: former use of marijuana   Sexual activity: Not Currently    Birth control/protection: None  Other Topics Concern   Not on file  Social History Narrative   Lives by herself    Social Drivers of Health   Financial Resource Strain: Low Risk  (10/13/2023)   Received from [facility]   Overall Financial Resource Strain (CARDIA)    Difficulty of Paying Living Expenses: Not very hard  Food Insecurity: No Food Insecurity (12/10/2023)   Hunger Vital Sign    Worried About Running Out of Food in the Last Year: Never true    Ran Out of Food in the Last Year: Never true  Recent Concern: Food Insecurity - Food Insecurity Present (09/27/2023)   Hunger Vital Sign    Worried About Running Out of Food in the Last Year: Sometimes true    Ran Out of Food in the Last Year: Sometimes true  Transportation Needs: No Transportation Needs (12/10/2023)   PRAPARE - Administrator, Civil Service (Medical): No    Lack of Transportation (Non-Medical): No  Recent Concern: Transportation Needs - Unmet Transportation Needs (09/27/2023)   PRAPARE - Administrator, Civil Service (Medical): Yes    Lack of Transportation (Non-Medical): Yes  Physical Activity: Not on file  Stress: Not on file  Social Connections: Unknown (12/11/2023)   Social Connection and Isolation Panel    Frequency of Communication with Friends and Family: More than three  times a week    Frequency of Social Gatherings with Friends and Family: More than three times a week    Attends Religious Services: Patient declined    Database administrator or Organizations: Patient declined    Attends Banker Meetings: Patient declined    Marital Status: Widowed  Recent Concern: Social Connections - Socially Isolated (09/28/2023)   Social Connection and Isolation Panel    Frequency of Communication with Friends and Family: Never    Frequency of Social Gatherings with Friends and Family: Never    Attends Religious Services: Never    Database administrator or Organizations: No    Attends Banker Meetings: Never    Marital Status: Widowed  Intimate Partner Violence: Not At Risk (12/10/2023)   Humiliation, Afraid, Rape, and Kick questionnaire    Fear of Current or Ex-Partner: No    Emotionally Abused: No    Physically Abused: No    Sexually Abused: No    History reviewed. No pertinent family history.   Current Outpatient Medications:    ALPRAZolam  (XANAX ) 0.25 MG tablet, Take  1 tablet (0.25 mg total) by mouth 2 (two) times daily as needed for anxiety., Disp: 10 tablet, Rfl: 0   amoxicillin -clavulanate (AUGMENTIN ) 500-125 MG tablet, Take 1 tablet by mouth 2 (two) times daily., Disp: , Rfl:    diphenhydrAMINE  (BENADRYL ) 50 MG capsule, Take 1 capsule (50 mg total) by mouth at bedtime as needed for itching, allergies or sleep., Disp: , Rfl:    feeding supplement (ENSURE PLUS HIGH PROTEIN) LIQD, Take 237 mLs by mouth 3 (three) times daily between meals., Disp: , Rfl:    ferrous sulfate  325 (65 FE) MG tablet, Take 1 tablet (325 mg total) by mouth daily with breakfast., Disp: , Rfl:    lidocaine -prilocaine  (EMLA ) cream, Apply to affected area once, Disp: 30 g, Rfl: 3   Multiple Vitamin (MULTIVITAMIN WITH MINERALS) TABS tablet, Take 1 tablet by mouth daily., Disp: 30 tablet, Rfl: 1   ondansetron  (ZOFRAN ) 8 MG tablet, Take 1 tablet (8 mg total) by mouth  every 8 (eight) hours as needed for nausea or vomiting. Start on the third day after carboplatin., Disp: 30 tablet, Rfl: 1   oxyCODONE  (OXY IR/ROXICODONE ) 5 MG immediate release tablet, Take 1 tablet (5 mg total) by mouth every 4 (four) hours as needed for severe pain (pain score 7-10)., Disp: 60 tablet, Rfl: 0   prochlorperazine  (COMPAZINE ) 10 MG tablet, Take 1 tablet (10 mg total) by mouth every 6 (six) hours as needed for nausea or vomiting., Disp: 30 tablet, Rfl: 1   traZODone (DESYREL) 50 MG tablet, Take 50 mg by mouth at bedtime., Disp: , Rfl:    [Paused] dexamethasone  (DECADRON ) 4 MG tablet, Take 2 tablets (8mg ) by mouth daily starting the day after carboplatin for 3 days. Take with food, Disp: 30 tablet, Rfl: 1   escitalopram  (LEXAPRO ) 5 MG tablet, Take 1 tablet every day by oral route in the morning, for depression. (Patient not taking: Reported on 12/02/2023), Disp: , Rfl:    folic acid  (FOLVITE ) 1 MG tablet, Take 1 tablet (1 mg total) by mouth daily. (Patient not taking: Reported on 11/04/2023), Disp: 30 tablet, Rfl: 0 No current facility-administered medications for this visit.  Facility-Administered Medications Ordered in Other Visits:    0.9 %  sodium chloride  infusion, , Intravenous, Continuous, Melanee Annah BROCKS, MD, Last Rate: 10 mL/hr at 01/08/24 1026, New Bag at 01/08/24 1026   CARBOplatin (PARAPLATIN) 270 mg in sodium chloride  0.9 % 100 mL chemo infusion, 270 mg, Intravenous, Once, Eddison Searls C, MD   PACLitaxel (TAXOL) 198 mg in sodium chloride  0.9 % 250 mL chemo infusion (> 80mg /m2), 135 mg/m2 (Treatment Plan Recorded), Intravenous, Once, Melanee Annah BROCKS, MD, Last Rate: 94 mL/hr at 01/08/24 1242, Rate Change at 01/08/24 1242  Physical exam: There were no vitals filed for this visit. Physical Exam Eyes:     Pupils: Pupils are equal, round, and reactive to light.  Cardiovascular:     Rate and Rhythm: Normal rate and regular rhythm.     Heart sounds: Normal heart sounds.   Pulmonary:     Effort: Pulmonary effort is normal.     Breath sounds: Normal breath sounds.  Skin:    General: Skin is warm and dry.  Neurological:     Mental Status: She is alert and oriented to person, place, and time.      I have personally reviewed labs listed below:    Latest Ref Rng & Units 01/08/2024    9:41 AM  CMP  Glucose 70 - 99 mg/dL  97   BUN 8 - 23 mg/dL 10   Creatinine 9.55 - 1.00 mg/dL 9.52   Sodium 864 - 854 mmol/L 138   Potassium 3.5 - 5.1 mmol/L 3.5   Chloride 98 - 111 mmol/L 107   CO2 22 - 32 mmol/L 26   Calcium  8.9 - 10.3 mg/dL 8.4   Total Protein 6.5 - 8.1 g/dL 7.2   Total Bilirubin 0.0 - 1.2 mg/dL 0.4   Alkaline Phos 38 - 126 U/L 66   AST 15 - 41 U/L 21   ALT 0 - 44 U/L 9       Latest Ref Rng & Units 01/08/2024    9:41 AM  CBC  WBC 4.0 - 10.5 K/uL 5.2   Hemoglobin 12.0 - 15.0 g/dL 9.1   Hematocrit 63.9 - 46.0 % 29.3   Platelets 150 - 400 K/uL 325    I have personally reviewed Radiology images listed below: No images are attached to the encounter.  IR IMAGING GUIDED PORT INSERTION Result Date: 12/17/2023 INDICATION: Port-A-Cath needed for treatment of endometrial cancer. EXAM: FLUOROSCOPIC AND ULTRASOUND GUIDED PLACEMENT OF A SUBCUTANEOUS PORT MEDICATIONS: Moderate sedation ANESTHESIA/SEDATION: Moderate (conscious) sedation was employed during this procedure. A total of Versed  2 mg and fentanyl  100 mcg was administered intravenously at the order of the provider performing the procedure. Total intra-service moderate sedation time: 24 minutes. Patient's level of consciousness and vital signs were monitored continuously by radiology nurse throughout the procedure under the supervision of the provider performing the procedure. FLUOROSCOPY TIME:  Radiation Exposure Index (as provided by the fluoroscopic device): 1 mGy Kerma COMPLICATIONS: None immediate. PROCEDURE: The procedure, risks, benefits, and alternatives were explained to the patient. Questions  regarding the procedure were encouraged and answered. The patient understands and consents to the procedure. Patient was placed supine on the interventional table. Ultrasound confirmed a patent right internal jugular vein. Ultrasound image was saved for documentation. The right chest and neck were cleaned with a skin antiseptic and a sterile drape was placed. Maximal barrier sterile technique was utilized including caps, mask, sterile gowns, sterile gloves, sterile drape, hand hygiene and skin antiseptic. The right neck was anesthetized with 1% lidocaine . Small incision was made in the right neck with a blade. Micropuncture set was placed in the right internal jugular vein with ultrasound guidance. The micropuncture wire was used for measurement purposes. The right chest was anesthetized with 1% lidocaine  with epinephrine . #15 blade was used to make an incision and a subcutaneous port pocket was formed. 8 french Power Port was assembled. Subcutaneous tunnel was formed with a stiff tunneling device. The port catheter was brought through the subcutaneous tunnel. The port was placed in the subcutaneous pocket. The micropuncture set was exchanged for a peel-away sheath. The catheter was placed through the peel-away sheath and the tip was positioned at the superior cavoatrial junction. Catheter placement was confirmed with fluoroscopy. The port was accessed and flushed with heparinized saline. The port pocket was closed using two layers of absorbable sutures and Dermabond. The vein skin site was closed using a single layer of absorbable suture and Dermabond. Sterile dressings were applied. Patient tolerated the procedure well without an immediate complication. Ultrasound and fluoroscopic images were taken and saved for this procedure. IMPRESSION: Placement of a subcutaneous power-injectable port device. Catheter tip at the superior cavoatrial junction. Electronically Signed   By: Juliene Balder M.D.   On: 12/17/2023 16:47    CT ABDOMEN PELVIS WO CONTRAST Result Date: 12/14/2023 EXAM: CT  ABDOMEN AND PELVIS WITHOUT CONTRAST 12/14/2023 08:34:03 PM TECHNIQUE: CT of the abdomen and pelvis was performed without the administration of intravenous contrast. Multiplanar reformatted images are provided for review. Automated exposure control, iterative reconstruction, and/or weight-based adjustment of the mA/kV was utilized to reduce the radiation dose to as low as reasonably achievable. COMPARISON: 11/06/2023 CLINICAL HISTORY: Ureteral cancer, monitor; ? uterine infection. Has sepsis and unknown source. FINDINGS: LOWER CHEST: No acute abnormality. Unchanged 6 mm subpleural nodule in parenchyma (series 4, image 3). LIVER: The liver is unremarkable. GALLBLADDER AND BILE DUCTS: Gallbladder is unremarkable. No biliary ductal dilatation. SPLEEN: No acute abnormality. PANCREAS: No acute abnormality. ADRENAL GLANDS: No acute abnormality. KIDNEYS, URETERS AND BLADDER: No stones in the kidneys or ureters. No hydronephrosis. No perinephric or periureteral stranding. Urinary bladder is unremarkable. GI AND BOWEL: Stomach demonstrates no acute abnormality. There is no bowel obstruction. PERITONEUM AND RETROPERITONEUM: No ascites. No free air. VASCULATURE: Aortic atherosclerotic calcification. LYMPH NODES: Interval increase in size of multiple retroperitoneal lymph nodes. For example, a left periaortic lymph node measuring 10 mm, following series 2, image 35, previously measured 7 mm. A 20 mm right external iliac node on series 2, image 63 previously measured 18 mm. A 16 mm left external iliac node on series 263, previously measured 14 mm. REPRODUCTIVE ORGANS: Evaluation of the known mass in the uterus is limited without IV contrast. There is mass-like expansion centered in the lower uterine segment with heterogeneous fluid in the uterine fundus. Findings are similar to CT dated 11/06/23, given the difference is in technique. 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 No acute osseous abnormality. No focal soft tissue abnormality. IMPRESSION: 1. Limited evaluation of the known uterine mass without IV contrast. Findings are similar to prior CT with contrast dated 11/06/23, given the difference in technique. 2. Interval increase in size of multiple retroperitoneal lymph nodes. Electronically signed by: Norman Gatlin MD 12/14/2023 09:05 PM EDT RP Workstation: [id number]   DG Chest Port 1 View Result Date: 12/10/2023 CLINICAL DATA:  Infection. EXAM: PORTABLE CHEST 1 VIEW COMPARISON:  11/22/2023. FINDINGS: The heart size and mediastinal contours are unchanged. No focal consolidation, pleural effusion, or pneumothorax. No acute osseous abnormality. IMPRESSION: No acute cardiopulmonary findings. Electronically Signed   By: Harrietta Sherry M.D.   On: 12/10/2023 16:20   IR PATIENT EVAL TECH 0-60 MINS Result Date: 12/10/2023 Delight Bebe SAILOR     12/10/2023 12:32 PM Procedure cancelled order used for wasted supplies    Assessment and plan- Patient is a 68 y.o. female with history of grade 2 endometrioid endometrial carcinoma FIGO stage III C1 TX N1a M0.  She is here for on treatment assessment prior to cycle 1 of CarboTaxol chemotherapy  Patient is somewhat frail and therefore I am proceeding with carboplatin AUC 4 along with Taxol at 135 mg/m today.  I will see her back in clinic for cycle 2.  I am also giving her Neulasta support with every cycle.  She will be seen by covering NP in 1 week with labs for possible IV fluids or blood transfusion  Normocytic anemia: There is a component of iron  deficiency as well given that she had significant vaginal bleeding recently.  I will give her 4 more doses of Venofer  at this time.  White blood cell count is presently normal and we will continue to monitor that   Visit Diagnosis 1. Endometrial cancer (HCC)   2. Encounter for antineoplastic chemotherapy   3. Iron  deficiency anemia, unspecified  iron   deficiency anemia type      Dr. Annah Skene, MD, MPH [facility] at [facility] [phone] 01/08/2024 12:54 PM

## 2024-01-08 NOTE — Progress Notes (Signed)
 Patient has no new or acute concerns at this time.

## 2024-01-09 LAB — T4: T4, Total: 5.4 ug/dL (ref 4.5–12.0)

## 2024-01-10 ENCOUNTER — Other Ambulatory Visit: Payer: Self-pay

## 2024-01-11 ENCOUNTER — Telehealth: Payer: Self-pay

## 2024-01-11 ENCOUNTER — Other Ambulatory Visit: Payer: Self-pay

## 2024-01-11 ENCOUNTER — Inpatient Hospital Stay

## 2024-01-11 NOTE — Telephone Encounter (Signed)
 Telephone call to patient for follow up after receiving first infusion.   Patient sister states infusion went great.  States eating good and drinking plenty of fluids.   Denies any nausea or vomiting.  Encouraged patient sister  to call for any concerns or questions.

## 2024-01-12 ENCOUNTER — Telehealth: Payer: Self-pay | Admitting: Oncology

## 2024-01-12 ENCOUNTER — Encounter: Payer: Self-pay | Admitting: Oncology

## 2024-01-12 NOTE — Telephone Encounter (Signed)
 Pt sister called and said that pt couldn't remember if she made her appt yesterday or not. I confirmed that she didn't come to her appt - spoke with Melanee team to get injection r/s - r/s infusion with pt sister (spoke with Allie in infusion to ensure that the schedule addition would be okay) - notified pt sister of the rest of the appts for this week so she could verify with her sister's facility to ensure that she wouldn't miss anymore appts - LH

## 2024-01-13 ENCOUNTER — Encounter: Payer: Self-pay | Admitting: Nurse Practitioner

## 2024-01-13 ENCOUNTER — Inpatient Hospital Stay: Admitting: Nurse Practitioner

## 2024-01-13 ENCOUNTER — Ambulatory Visit

## 2024-01-13 ENCOUNTER — Other Ambulatory Visit

## 2024-01-13 VITALS — BP 97/54 | HR 89 | Temp 98.1°F

## 2024-01-13 VITALS — BP 97/54 | HR 89 | Temp 98.1°F | Resp 20

## 2024-01-13 DIAGNOSIS — Z09 Encounter for follow-up examination after completed treatment for conditions other than malignant neoplasm: Secondary | ICD-10-CM

## 2024-01-13 DIAGNOSIS — C541 Malignant neoplasm of endometrium: Secondary | ICD-10-CM | POA: Diagnosis not present

## 2024-01-13 DIAGNOSIS — D649 Anemia, unspecified: Secondary | ICD-10-CM

## 2024-01-13 DIAGNOSIS — Z5112 Encounter for antineoplastic immunotherapy: Secondary | ICD-10-CM | POA: Diagnosis not present

## 2024-01-13 DIAGNOSIS — D500 Iron deficiency anemia secondary to blood loss (chronic): Secondary | ICD-10-CM | POA: Diagnosis not present

## 2024-01-13 LAB — CBC WITH DIFFERENTIAL (CANCER CENTER ONLY)
Abs Immature Granulocytes: 0.02 K/uL (ref 0.00–0.07)
Basophils Absolute: 0 K/uL (ref 0.0–0.1)
Basophils Relative: 1 %
Eosinophils Absolute: 0.5 K/uL (ref 0.0–0.5)
Eosinophils Relative: 12 %
HCT: 29.5 % — ABNORMAL LOW (ref 36.0–46.0)
Hemoglobin: 9.1 g/dL — ABNORMAL LOW (ref 12.0–15.0)
Immature Granulocytes: 1 %
Lymphocytes Relative: 14 %
Lymphs Abs: 0.6 K/uL — ABNORMAL LOW (ref 0.7–4.0)
MCH: 27.1 pg (ref 26.0–34.0)
MCHC: 30.8 g/dL (ref 30.0–36.0)
MCV: 87.8 fL (ref 80.0–100.0)
Monocytes Absolute: 0.1 K/uL (ref 0.1–1.0)
Monocytes Relative: 1 %
Neutro Abs: 3 K/uL (ref 1.7–7.7)
Neutrophils Relative %: 71 %
Platelet Count: 278 K/uL (ref 150–400)
RBC: 3.36 MIL/uL — ABNORMAL LOW (ref 3.87–5.11)
RDW: 21.2 % — ABNORMAL HIGH (ref 11.5–15.5)
Smear Review: NORMAL
WBC Count: 4.2 K/uL (ref 4.0–10.5)
nRBC: 0 % (ref 0.0–0.2)

## 2024-01-13 LAB — BASIC METABOLIC PANEL - CANCER CENTER ONLY
Anion gap: 6 (ref 5–15)
BUN: 11 mg/dL (ref 8–23)
CO2: 26 mmol/L (ref 22–32)
Calcium: 8.6 mg/dL — ABNORMAL LOW (ref 8.9–10.3)
Chloride: 102 mmol/L (ref 98–111)
Creatinine: 0.42 mg/dL — ABNORMAL LOW (ref 0.44–1.00)
GFR, Estimated: >60 mL/min (ref >60)
Glucose, Bld: 124 mg/dL — ABNORMAL HIGH (ref 70–99)
Potassium: 3.4 mmol/L — ABNORMAL LOW (ref 3.5–5.1)
Sodium: 134 mmol/L — ABNORMAL LOW (ref 135–145)

## 2024-01-13 LAB — SAMPLE TO BLOOD BANK

## 2024-01-13 MED ORDER — SODIUM CHLORIDE 0.9% FLUSH
10.0000 mL | Freq: Once | INTRAVENOUS | Status: AC | PRN
  Administered 2024-01-13: 10 mL
  Filled 2024-01-13: qty 10

## 2024-01-13 MED ORDER — IRON SUCROSE 20 MG/ML IV SOLN
200.0000 mg | INTRAVENOUS | Status: DC
  Administered 2024-01-13: 200 mg via INTRAVENOUS
  Filled 2024-01-13: qty 10

## 2024-01-13 NOTE — Progress Notes (Deleted)
 Hematology/Oncology Consult Note [facility]  [phone] [phone]  Patient Care Team: Pcp, No as PCP - General Maurie Rayfield BIRCH, RN as Oncology Nurse Navigator Melanee Annah BROCKS, MD as Consulting Physician (Oncology)   Name of the patient: Valerie Leonard  [id number]  02-Mar-1956   Date of visit: 01/13/24  Diagnosis-  Cancer Staging  Endometrial cancer [facility]) Staging form: Corpus Uteri - Carcinoma and Carcinosarcoma, AJCC 8th Edition and FIGO 2023 - Clinical stage from 12/02/2023: FIGO Stage IIIC1, calculated as Stage Unknown (cTX, cN1a, cM0) - Signed by Melanee Annah BROCKS, MD on 12/02/2023 Histologic grade (G): G2 Histologic grading system: 3 grade system - Pathologic stage from 12/02/2023: FIGO Stage IIIC - Unsigned Histopathologic type: Endometrioid adenocarcinoma, NOS Stage prefix: Initial diagnosis   Chief complaint/ Reason for visit- Chemo follow up  Heme/Onc history: Patient is a 68 year old female who presented with post menopausal bleeding in June 2025.  Her hemoglobin had dropped down to 5.8 requiring PRBC transfusion.   Pelvic ultrasound in June 2025 showed large amount of blood products throughout the endometrial panel.  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  Wild-type staining for p53.  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  Enlarged bilateral iliac sided chain and pelvic sidewall lymph nodes compatible with nodal disease involvement.  Scattered pulmonary nodules measuring up to 6 mm nonspecific but suspicious for metastatic disease.    Patient completed a course of palliative radiation to her vagina  Interval history- Valerie Leonard is a 68 y.o. female who completed palliative radiation for vaginal bleeding with symptomatic anemia and is now s/p cycle 1 of carbo-taxol chemotherapy who presents today for chemo follow up.     ECOG PS- 2 Pain scale- 0  Review of systems- Review of Systems  Constitutional:  Positive for malaise/fatigue. Negative for chills, fever and weight loss.  HENT:  Negative for congestion, ear discharge and nosebleeds.   Eyes:  Negative for blurred vision.  Respiratory:  Negative for cough, hemoptysis, sputum production, shortness of breath and wheezing.   Cardiovascular:  Negative for chest pain, palpitations, orthopnea and claudication.  Gastrointestinal:  Negative for abdominal pain, blood in stool, constipation, diarrhea, heartburn, melena, nausea and vomiting.  Genitourinary:  Negative for dysuria, flank pain, frequency, hematuria and urgency.  Musculoskeletal:  Negative for back pain, joint pain and myalgias.  Skin:  Negative for rash.  Neurological:  Negative for dizziness, tingling, focal weakness, seizures, weakness and headaches.  Endo/Heme/Allergies:  Does not bruise/bleed easily.  Psychiatric/Behavioral:  Negative for depression and suicidal ideas. The patient does not have insomnia.      No Known Allergies   Past Medical History:  Diagnosis Date   Acute delirium    Acute metabolic encephalopathy    Anemia    blood loss anemia   Chronic alcohol abuse    Depression    Endometrial cancer (HCC)    Hypokalemia    Hypomagnesemia    Pressure injury of skin    Severe major depression, single episode, without psychotic features (HCC)    Vaginal bleeding     Past Surgical History:  Procedure Laterality Date   IR IMAGING GUIDED PORT INSERTION  12/17/2023   IR PATIENT EVAL TECH 0-60 MINS  12/10/2023   NO PAST SURGERIES      Social History   Socioeconomic History   Marital  status: Single    Spouse name: Not on file   Number of children: 0   Years of education: Not on file   Highest education level: Not on file  Occupational History   Not on file  Tobacco Use   Smoking status: Former    Types: Cigarettes, Cigars   Smokeless tobacco: Never  Vaping Use   Vaping status: Never Used  Substance and Sexual Activity   Alcohol use: Yes    Alcohol/week: 3.0 standard drinks of alcohol    Types: 3 Cans of beer per week    Comment: More than 4 glasses of wine, mixed drinks, beer daily.    Drug use: Not Currently    Comment: former use of marijuana   Sexual activity: Not Currently    Birth control/protection: None  Other Topics Concern   Not on file  Social History Narrative   Lives by herself    Social Drivers of Health   Financial Resource Strain: Low Risk  (10/13/2023)   Received from [facility]   Overall Financial Resource Strain (CARDIA)    Difficulty of Paying Living Expenses: Not very hard  Food Insecurity: No Food Insecurity (12/10/2023)   Hunger Vital Sign    Worried About Running Out of Food in the Last Year: Never true    Ran Out of Food in the Last Year: Never true  Recent Concern: Food Insecurity - Food Insecurity Present (09/27/2023)   Hunger Vital Sign    Worried About Running Out of Food in the Last Year: Sometimes true    Ran Out of Food in the Last Year: Sometimes true  Transportation Needs: No Transportation Needs (12/10/2023)   PRAPARE - Administrator, Civil Service (Medical): No    Lack of Transportation (Non-Medical): No  Recent Concern: Transportation Needs - Unmet Transportation Needs (09/27/2023)   PRAPARE - Administrator, Civil Service (Medical): Yes    Lack of Transportation (Non-Medical): Yes  Physical Activity: Not on file  Stress: Not on file  Social Connections: Unknown (12/11/2023)   Social Connection and Isolation Panel    Frequency of Communication with Friends and Family: More  than three times a week    Frequency of Social Gatherings with Friends and Family: More than three times a week    Attends Religious Services: Patient declined    Database administrator or Organizations: Patient declined    Attends Banker Meetings: Patient declined    Marital Status: Widowed  Recent Concern: Social Connections - Socially Isolated (09/28/2023)   Social Connection and Isolation Panel    Frequency of Communication with Friends and Family: Never    Frequency of Social Gatherings with Friends and Family: Never    Attends Religious Services: Never    Database administrator or Organizations: No    Attends Banker Meetings: Never    Marital Status: Widowed  Intimate Partner Violence: Not At Risk (12/10/2023)   Humiliation, Afraid, Rape, and Kick questionnaire    Fear of Current or Ex-Partner: No    Emotionally Abused: No    Physically Abused: No    Sexually Abused: No    No family history on file.   Current Outpatient Medications:    ALPRAZolam  (XANAX ) 0.25 MG tablet, Take 1  tablet (0.25 mg total) by mouth 2 (two) times daily as needed for anxiety., Disp: 10 tablet, Rfl: 0   amoxicillin -clavulanate (AUGMENTIN ) 500-125 MG tablet, Take 1 tablet by mouth 2 (two) times daily., Disp: , Rfl:    [Paused] dexamethasone  (DECADRON ) 4 MG tablet, Take 2 tablets (8mg ) by mouth daily starting the day after carboplatin for 3 days. Take with food, Disp: 30 tablet, Rfl: 1   diphenhydrAMINE  (BENADRYL ) 50 MG capsule, Take 1 capsule (50 mg total) by mouth at bedtime as needed for itching, allergies or sleep., Disp: , Rfl:    escitalopram  (LEXAPRO ) 5 MG tablet, Take 1 tablet every day by oral route in the morning, for depression. (Patient not taking: Reported on 12/02/2023), Disp: , Rfl:    feeding supplement (ENSURE PLUS HIGH PROTEIN) LIQD, Take 237 mLs by mouth 3 (three) times daily between meals., Disp: , Rfl:    ferrous sulfate  325 (65 FE) MG tablet, Take 1 tablet (325  mg total) by mouth daily with breakfast., Disp: , Rfl:    folic acid  (FOLVITE ) 1 MG tablet, Take 1 tablet (1 mg total) by mouth daily. (Patient not taking: Reported on 11/04/2023), Disp: 30 tablet, Rfl: 0   lidocaine -prilocaine  (EMLA ) cream, Apply to affected area once, Disp: 30 g, Rfl: 3   Multiple Vitamin (MULTIVITAMIN WITH MINERALS) TABS tablet, Take 1 tablet by mouth daily., Disp: 30 tablet, Rfl: 1   ondansetron  (ZOFRAN ) 8 MG tablet, Take 1 tablet (8 mg total) by mouth every 8 (eight) hours as needed for nausea or vomiting. Start on the third day after carboplatin., Disp: 30 tablet, Rfl: 1   oxyCODONE  (OXY IR/ROXICODONE ) 5 MG immediate release tablet, Take 1 tablet (5 mg total) by mouth every 4 (four) hours as needed for severe pain (pain score 7-10)., Disp: 60 tablet, Rfl: 0   prochlorperazine  (COMPAZINE ) 10 MG tablet, Take 1 tablet (10 mg total) by mouth every 6 (six) hours as needed for nausea or vomiting., Disp: 30 tablet, Rfl: 1   traZODone (DESYREL) 50 MG tablet, Take 50 mg by mouth at bedtime., Disp: , Rfl:  No current facility-administered medications for this visit.  Facility-Administered Medications Ordered in Other Visits:    iron  sucrose (VENOFER ) injection 200 mg, 200 mg, Intravenous, Weekly, Rao, Archana C, MD, 200 mg at 01/13/24 [id number]  Physical exam: There were no vitals filed for this visit. Physical Exam Eyes:     Pupils: Pupils are equal, round, and reactive to light.  Cardiovascular:     Rate and Rhythm: Normal rate and regular rhythm.     Heart sounds: Normal heart sounds.  Pulmonary:     Effort: Pulmonary effort is normal.     Breath sounds: Normal breath sounds.  Skin:    General: Skin is warm and dry.  Neurological:     Mental Status: She is alert and oriented to person, place, and time.     I have personally reviewed labs listed below:    Latest Ref Rng & Units 01/13/2024    9:33 AM  CMP  Glucose 70 - 99 mg/dL 875   BUN 8 - 23 mg/dL 11   Creatinine 9.55 -  1.00 mg/dL 9.57   Sodium 864 - 854 mmol/L 134   Potassium 3.5 - 5.1 mmol/L 3.4   Chloride 98 - 111 mmol/L 102   CO2 22 - 32 mmol/L 26   Calcium  8.9 - 10.3 mg/dL 8.6       Latest Ref Rng & Units 01/13/2024  9:33 AM  CBC  WBC 4.0 - 10.5 K/uL 4.2   Hemoglobin 12.0 - 15.0 g/dL 9.1   Hematocrit 63.9 - 46.0 % 29.5   Platelets 150 - 400 K/uL 278    I have personally reviewed radiology images listed below: No images are attached to the encounter.  IR IMAGING GUIDED PORT INSERTION Result Date: 12/17/2023 INDICATION: Port-A-Cath needed for treatment of endometrial cancer. EXAM: FLUOROSCOPIC AND ULTRASOUND GUIDED PLACEMENT OF A SUBCUTANEOUS PORT MEDICATIONS: Moderate sedation ANESTHESIA/SEDATION: Moderate (conscious) sedation was employed during this procedure. A total of Versed  2 mg and fentanyl  100 mcg was administered intravenously at the order of the provider performing the procedure. Total intra-service moderate sedation time: 24 minutes. Patient's level of consciousness and vital signs were monitored continuously by radiology nurse throughout the procedure under the supervision of the provider performing the procedure. FLUOROSCOPY TIME:  Radiation Exposure Index (as provided by the fluoroscopic device): 1 mGy Kerma COMPLICATIONS: None immediate. PROCEDURE: The procedure, risks, benefits, and alternatives were explained to the patient. Questions regarding the procedure were encouraged and answered. The patient understands and consents to the procedure. Patient was placed supine on the interventional table. Ultrasound confirmed a patent right internal jugular vein. Ultrasound image was saved for documentation. The right chest and neck were cleaned with a skin antiseptic and a sterile drape was placed. Maximal barrier sterile technique was utilized including caps, mask, sterile gowns, sterile gloves, sterile drape, hand hygiene and skin antiseptic. The right neck was anesthetized with 1% lidocaine .  Small incision was made in the right neck with a blade. Micropuncture set was placed in the right internal jugular vein with ultrasound guidance. The micropuncture wire was used for measurement purposes. The right chest was anesthetized with 1% lidocaine  with epinephrine . #15 blade was used to make an incision and a subcutaneous port pocket was formed. 8 french Power Port was assembled. Subcutaneous tunnel was formed with a stiff tunneling device. The port catheter was brought through the subcutaneous tunnel. The port was placed in the subcutaneous pocket. The micropuncture set was exchanged for a peel-away sheath. The catheter was placed through the peel-away sheath and the tip was positioned at the superior cavoatrial junction. Catheter placement was confirmed with fluoroscopy. The port was accessed and flushed with heparinized saline. The port pocket was closed using two layers of absorbable sutures and Dermabond. The vein skin site was closed using a single layer of absorbable suture and Dermabond. Sterile dressings were applied. Patient tolerated the procedure well without an immediate complication. Ultrasound and fluoroscopic images were taken and saved for this procedure. IMPRESSION: Placement of a subcutaneous power-injectable port device. Catheter tip at the superior cavoatrial junction. Electronically Signed   By: Juliene Balder M.D.   On: 12/17/2023 16:47   CT ABDOMEN PELVIS WO CONTRAST Result Date: 12/14/2023 EXAM: CT ABDOMEN AND PELVIS WITHOUT CONTRAST 12/14/2023 08:34:03 PM TECHNIQUE: CT of the abdomen and pelvis was performed without the administration of intravenous contrast. Multiplanar reformatted images are provided for review. Automated exposure control, iterative reconstruction, and/or weight-based adjustment of the mA/kV was utilized to reduce the radiation dose to as low as reasonably achievable. COMPARISON: 11/06/2023 CLINICAL HISTORY: Ureteral cancer, monitor; ? uterine infection. Has sepsis and  unknown source. FINDINGS: LOWER CHEST: No acute abnormality. Unchanged 6 mm subpleural nodule in parenchyma (series 4, image 3). LIVER: The liver is unremarkable. GALLBLADDER AND BILE DUCTS: Gallbladder is unremarkable. No biliary ductal dilatation. SPLEEN: No acute abnormality. PANCREAS: No acute abnormality. ADRENAL GLANDS: No acute abnormality.  KIDNEYS, URETERS AND BLADDER: No stones in the kidneys or ureters. No hydronephrosis. No perinephric or periureteral stranding. Urinary bladder is unremarkable. GI AND BOWEL: Stomach demonstrates no acute abnormality. There is no bowel obstruction. PERITONEUM AND RETROPERITONEUM: No ascites. No free air. VASCULATURE: Aortic atherosclerotic calcification. LYMPH NODES: Interval increase in size of multiple retroperitoneal lymph nodes. For example, a left periaortic lymph node measuring 10 mm, following series 2, image 35, previously measured 7 mm. A 20 mm right external iliac node on series 2, image 63 previously measured 18 mm. A 16 mm left external iliac node on series 263, previously measured 14 mm. REPRODUCTIVE ORGANS: Evaluation of the known mass in the uterus is limited without IV contrast. There is mass-like expansion centered in the lower uterine segment with heterogeneous fluid in the uterine fundus. Findings are similar to CT dated 11/06/23, given the difference is in technique. 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 No acute osseous abnormality. No focal soft tissue abnormality. IMPRESSION: 1. Limited evaluation of the known uterine mass without IV contrast. Findings are similar to prior CT with contrast dated 11/06/23, given the difference in technique. 2. Interval increase in size of multiple retroperitoneal lymph nodes. Electronically signed by: Norman Gatlin MD 12/14/2023 09:05 PM EDT RP Workstation: [id number]     Assessment and plan- Patient is a 68 y.o. female   Endometrial Cancer- stage IIIC1 - TxN1aM0. Grade 2 endometrioid endometrial  carcinoma. Completed palliative radiation for ongoing heavy vaginal bleeding and hemorrhage s/p vaginal packing and removal. She has now received cycle 1 of carbo-taxol chemotherapy on 01/08/24 with Dr Melanee. Due to frailty, she received carbo AUC 4 with paclitaxel 135 mg/m2.  Anemia- secondary to ongoing vaginal bleeding and blood loss.    grade 2 endometrioid endometrial carcinoma FIGO stage III C1 TX N1a M0.  She is here for on treatment assessment prior to cycle 1 of CarboTaxol chemotherapy  Patient is somewhat frail and therefore I am proceeding with carboplatin AUC 4 along with Taxol at 135 mg/m today.  I will see her back in clinic for cycle 2.  I am also giving her Neulasta support with every cycle.  She will be seen by covering NP in 1 week with labs for possible IV fluids or blood transfusion  Normocytic anemia: There is a component of iron  deficiency as well given that she had significant vaginal bleeding recently.  I will give her 4 more doses of Venofer  at this time.  White blood cell count is presently normal and we will continue to monitor that   Visit Diagnosis No diagnosis found.    Dr. Annah Melanee, MD, MPH [facility] at [facility] [phone] 01/13/2024 10:17 AM

## 2024-01-13 NOTE — Progress Notes (Signed)
 Hematology/Oncology Consult Note [facility]  [phone] [phone]  Patient Care Team: Pcp, No as PCP - General Maurie Rayfield BIRCH, RN as Oncology Nurse Navigator Melanee Annah BROCKS, MD as Consulting Physician (Oncology)   Name of the patient: Valerie Leonard  [id number]  02/09/1956   Date of visit: 01/13/24  Diagnosis-  Cancer Staging  Endometrial cancer [facility]) Staging form: Corpus Uteri - Carcinoma and Carcinosarcoma, AJCC 8th Edition and FIGO 2023 - Clinical stage from 12/02/2023: FIGO Stage IIIC1, calculated as Stage Unknown (cTX, cN1a, cM0) - Signed by Melanee Annah BROCKS, MD on 12/02/2023 Histologic grade (G): G2 Histologic grading system: 3 grade system - Pathologic stage from 12/02/2023: FIGO Stage IIIC - Unsigned Histopathologic type: Endometrioid adenocarcinoma, NOS Stage prefix: Initial diagnosis   Chief complaint/ Reason for visit- Chemo follow up  Heme/Onc history: Patient is a 68 year old female who presented with post menopausal bleeding in June 2025.  Her hemoglobin had dropped down to 5.8 requiring PRBC transfusion.   Pelvic ultrasound in June 2025 showed large amount of blood products throughout the endometrial panel.  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  Wild-type staining for p53.  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  Enlarged bilateral iliac sided chain and pelvic sidewall lymph nodes compatible with nodal disease involvement.  Scattered pulmonary nodules measuring up to 6 mm nonspecific but suspicious for metastatic disease.    Patient completed a course of palliative radiation to her vagina  Interval history- Valerie Leonard is a 68 y.o. female who completed palliative radiation for vaginal bleeding with symptomatic anemia and is now s/p cycle 1 of carbo-taxol chemotherapy who presents today for chemo follow up. She is not having any additional vaginal bleeding. Has felt tired since taking her chemo and increased brain fog. She is residing at an assisted living and enjoys this change. Says that overall she feels somewhat stronger and improved appetite since starting cancer treatment. She received IV iron  today.   ECOG PS- 2 Pain scale- 0  Review of systems- Review of Systems  Constitutional:  Positive for malaise/fatigue. Negative for chills, fever and weight loss.  HENT:  Negative for congestion, ear discharge and nosebleeds.   Eyes:  Negative for blurred vision.  Respiratory:  Negative for cough, hemoptysis, sputum production, shortness of breath and wheezing.   Cardiovascular:  Negative for chest pain, palpitations, orthopnea and claudication.  Gastrointestinal:  Negative for abdominal pain, blood in stool, constipation, diarrhea, heartburn, melena, nausea and vomiting.  Genitourinary:  Negative for dysuria, flank pain, frequency, hematuria and urgency.  Musculoskeletal:  Negative for back pain, joint pain and myalgias.  Skin:  Negative for rash.  Neurological:  Negative for dizziness, tingling, focal weakness, seizures, weakness and headaches.  Endo/Heme/Allergies:  Does not bruise/bleed easily.  Psychiatric/Behavioral:  Negative for depression and suicidal ideas. The patient does not have insomnia.     No Known Allergies  Past Medical History:  Diagnosis Date   Acute delirium    Acute metabolic encephalopathy    Anemia    blood loss anemia   Chronic alcohol abuse    Depression    Endometrial cancer (HCC)    Hypokalemia  Hypomagnesemia    Pressure injury of skin    Severe major depression, single  episode, without psychotic features (HCC)    Vaginal bleeding    Past Surgical History:  Procedure Laterality Date   IR IMAGING GUIDED PORT INSERTION  12/17/2023   IR PATIENT EVAL TECH 0-60 MINS  12/10/2023   NO PAST SURGERIES     Social History   Socioeconomic History   Marital status: Single    Spouse name: Not on file   Number of children: 0   Years of education: Not on file   Highest education level: Not on file  Occupational History   Not on file  Tobacco Use   Smoking status: Former    Types: Cigarettes, Cigars   Smokeless tobacco: Never  Vaping Use   Vaping status: Never Used  Substance and Sexual Activity   Alcohol use: Yes    Alcohol/week: 3.0 standard drinks of alcohol    Types: 3 Cans of beer per week    Comment: More than 4 glasses of wine, mixed drinks, beer daily.    Drug use: Not Currently    Comment: former use of marijuana   Sexual activity: Not Currently    Birth control/protection: None  Other Topics Concern   Not on file  Social History Narrative   Lives by herself    Social Drivers of Health   Financial Resource Strain: Low Risk  (10/13/2023)   Received from [facility]   Overall Financial Resource Strain (CARDIA)    Difficulty of Paying Living Expenses: Not very hard  Food Insecurity: No Food Insecurity (12/10/2023)   Hunger Vital Sign    Worried About Running Out of Food in the Last Year: Never true    Ran Out of Food in the Last Year: Never true  Recent Concern: Food Insecurity - Food Insecurity Present (09/27/2023)   Hunger Vital Sign    Worried About Running Out of Food in the Last Year: Sometimes true    Ran Out of Food in the Last Year: Sometimes true  Transportation Needs: No Transportation Needs (12/10/2023)   PRAPARE - Administrator, Civil Service (Medical): No    Lack of Transportation (Non-Medical): No  Recent Concern: Transportation Needs - Unmet Transportation Needs (09/27/2023)   PRAPARE - Therapist, art (Medical): Yes    Lack of Transportation (Non-Medical): Yes  Physical Activity: Not on file  Stress: Not on file  Social Connections: Unknown (12/11/2023)   Social Connection and Isolation Panel    Frequency of Communication with Friends and Family: More than three times a week    Frequency of Social Gatherings with Friends and Family: More than three times a week    Attends Religious Services: Patient declined    Database administrator or Organizations: Patient declined    Attends Banker Meetings: Patient declined    Marital Status: Widowed  Recent Concern: Social Connections - Socially Isolated (09/28/2023)   Social Connection and Isolation Panel    Frequency of Communication with Friends and Family: Never    Frequency of Social Gatherings with Friends and Family: Never    Attends Religious Services: Never    Database administrator or Organizations: No    Attends Banker Meetings: Never    Marital Status: Widowed  Intimate Partner Violence: Not At Risk (12/10/2023)   Humiliation, Afraid, Rape, and Kick questionnaire    Fear of Current or Ex-Partner:  No    Emotionally Abused: No    Physically Abused: No    Sexually Abused: No   History reviewed. No pertinent family history.   Current Outpatient Medications:    ALPRAZolam  (XANAX ) 0.25 MG tablet, Take 1 tablet (0.25 mg total) by mouth 2 (two) times daily as needed for anxiety., Disp: 10 tablet, Rfl: 0   amoxicillin -clavulanate (AUGMENTIN ) 500-125 MG tablet, Take 1 tablet by mouth 2 (two) times daily., Disp: , Rfl:    diphenhydrAMINE  (BENADRYL ) 50 MG capsule, Take 1 capsule (50 mg total) by mouth at bedtime as needed for itching, allergies or sleep., Disp: , Rfl:    feeding supplement (ENSURE PLUS HIGH PROTEIN) LIQD, Take 237 mLs by mouth 3 (three) times daily between meals., Disp: , Rfl:    ferrous sulfate  325 (65 FE) MG tablet, Take 1 tablet (325 mg total) by mouth daily with  breakfast., Disp: , Rfl:    lidocaine -prilocaine  (EMLA ) cream, Apply to affected area once, Disp: 30 g, Rfl: 3   Multiple Vitamin (MULTIVITAMIN WITH MINERALS) TABS tablet, Take 1 tablet by mouth daily., Disp: 30 tablet, Rfl: 1   ondansetron  (ZOFRAN ) 8 MG tablet, Take 1 tablet (8 mg total) by mouth every 8 (eight) hours as needed for nausea or vomiting. Start on the third day after carboplatin., Disp: 30 tablet, Rfl: 1   oxyCODONE  (OXY IR/ROXICODONE ) 5 MG immediate release tablet, Take 1 tablet (5 mg total) by mouth every 4 (four) hours as needed for severe pain (pain score 7-10)., Disp: 60 tablet, Rfl: 0   prochlorperazine  (COMPAZINE ) 10 MG tablet, Take 1 tablet (10 mg total) by mouth every 6 (six) hours as needed for nausea or vomiting., Disp: 30 tablet, Rfl: 1   traZODone (DESYREL) 50 MG tablet, Take 50 mg by mouth at bedtime., Disp: , Rfl:    [Paused] dexamethasone  (DECADRON ) 4 MG tablet, Take 2 tablets (8mg ) by mouth daily starting the day after carboplatin for 3 days. Take with food, Disp: 30 tablet, Rfl: 1   escitalopram  (LEXAPRO ) 5 MG tablet, Take 1 tablet every day by oral route in the morning, for depression. (Patient not taking: Reported on 01/13/2024), Disp: , Rfl:    folic acid  (FOLVITE ) 1 MG tablet, Take 1 tablet (1 mg total) by mouth daily. (Patient not taking: Reported on 01/13/2024), Disp: 30 tablet, Rfl: 0  Physical exam:  Vitals:   01/13/24 1030  BP: (!) 97/54  Pulse: 89  Resp: 20  Temp: 98.1 F (36.7 C)  SpO2: 100%   Physical Exam Vitals reviewed.  Constitutional:      Appearance: She is not ill-appearing.  Cardiovascular:     Rate and Rhythm: Normal rate and regular rhythm.  Pulmonary:     Effort: Pulmonary effort is normal.     Breath sounds: Normal breath sounds.  Abdominal:     General: There is no distension.  Skin:    General: Skin is warm and dry.  Neurological:     Mental Status: She is alert and oriented to person, place, and time.  Psychiatric:         Mood and Affect: Mood normal.        Behavior: Behavior normal.     I have personally reviewed labs listed below:    Latest Ref Rng & Units 01/13/2024    9:33 AM  CMP  Glucose 70 - 99 mg/dL 875   BUN 8 - 23 mg/dL 11   Creatinine 9.55 - 1.00 mg/dL 9.57   Sodium  135 - 145 mmol/L 134   Potassium 3.5 - 5.1 mmol/L 3.4   Chloride 98 - 111 mmol/L 102   CO2 22 - 32 mmol/L 26   Calcium  8.9 - 10.3 mg/dL 8.6       Latest Ref Rng & Units 01/13/2024    9:33 AM  CBC  WBC 4.0 - 10.5 K/uL 4.2   Hemoglobin 12.0 - 15.0 g/dL 9.1   Hematocrit 63.9 - 46.0 % 29.5   Platelets 150 - 400 K/uL 278    I have personally reviewed radiology images listed below: No images are attached to the encounter.  IR IMAGING GUIDED PORT INSERTION Result Date: 12/17/2023 INDICATION: Port-A-Cath needed for treatment of endometrial cancer. EXAM: FLUOROSCOPIC AND ULTRASOUND GUIDED PLACEMENT OF A SUBCUTANEOUS PORT MEDICATIONS: Moderate sedation ANESTHESIA/SEDATION: Moderate (conscious) sedation was employed during this procedure. A total of Versed  2 mg and fentanyl  100 mcg was administered intravenously at the order of the provider performing the procedure. Total intra-service moderate sedation time: 24 minutes. Patient's level of consciousness and vital signs were monitored continuously by radiology nurse throughout the procedure under the supervision of the provider performing the procedure. FLUOROSCOPY TIME:  Radiation Exposure Index (as provided by the fluoroscopic device): 1 mGy Kerma COMPLICATIONS: None immediate. PROCEDURE: The procedure, risks, benefits, and alternatives were explained to the patient. Questions regarding the procedure were encouraged and answered. The patient understands and consents to the procedure. Patient was placed supine on the interventional table. Ultrasound confirmed a patent right internal jugular vein. Ultrasound image was saved for documentation. The right chest and neck were cleaned with a skin  antiseptic and a sterile drape was placed. Maximal barrier sterile technique was utilized including caps, mask, sterile gowns, sterile gloves, sterile drape, hand hygiene and skin antiseptic. The right neck was anesthetized with 1% lidocaine . Small incision was made in the right neck with a blade. Micropuncture set was placed in the right internal jugular vein with ultrasound guidance. The micropuncture wire was used for measurement purposes. The right chest was anesthetized with 1% lidocaine  with epinephrine . #15 blade was used to make an incision and a subcutaneous port pocket was formed. 8 french Power Port was assembled. Subcutaneous tunnel was formed with a stiff tunneling device. The port catheter was brought through the subcutaneous tunnel. The port was placed in the subcutaneous pocket. The micropuncture set was exchanged for a peel-away sheath. The catheter was placed through the peel-away sheath and the tip was positioned at the superior cavoatrial junction. Catheter placement was confirmed with fluoroscopy. The port was accessed and flushed with heparinized saline. The port pocket was closed using two layers of absorbable sutures and Dermabond. The vein skin site was closed using a single layer of absorbable suture and Dermabond. Sterile dressings were applied. Patient tolerated the procedure well without an immediate complication. Ultrasound and fluoroscopic images were taken and saved for this procedure. IMPRESSION: Placement of a subcutaneous power-injectable port device. Catheter tip at the superior cavoatrial junction. Electronically Signed   By: Juliene Balder M.D.   On: 12/17/2023 16:47   CT ABDOMEN PELVIS WO CONTRAST Result Date: 12/14/2023 EXAM: CT ABDOMEN AND PELVIS WITHOUT CONTRAST 12/14/2023 08:34:03 PM TECHNIQUE: CT of the abdomen and pelvis was performed without the administration of intravenous contrast. Multiplanar reformatted images are provided for review. Automated exposure control,  iterative reconstruction, and/or weight-based adjustment of the mA/kV was utilized to reduce the radiation dose to as low as reasonably achievable. COMPARISON: 11/06/2023 CLINICAL HISTORY: Ureteral cancer, monitor; ? uterine  infection. Has sepsis and unknown source. FINDINGS: LOWER CHEST: No acute abnormality. Unchanged 6 mm subpleural nodule in parenchyma (series 4, image 3). LIVER: The liver is unremarkable. GALLBLADDER AND BILE DUCTS: Gallbladder is unremarkable. No biliary ductal dilatation. SPLEEN: No acute abnormality. PANCREAS: No acute abnormality. ADRENAL GLANDS: No acute abnormality. KIDNEYS, URETERS AND BLADDER: No stones in the kidneys or ureters. No hydronephrosis. No perinephric or periureteral stranding. Urinary bladder is unremarkable. GI AND BOWEL: Stomach demonstrates no acute abnormality. There is no bowel obstruction. PERITONEUM AND RETROPERITONEUM: No ascites. No free air. VASCULATURE: Aortic atherosclerotic calcification. LYMPH NODES: Interval increase in size of multiple retroperitoneal lymph nodes. For example, a left periaortic lymph node measuring 10 mm, following series 2, image 35, previously measured 7 mm. A 20 mm right external iliac node on series 2, image 63 previously measured 18 mm. A 16 mm left external iliac node on series 263, previously measured 14 mm. REPRODUCTIVE ORGANS: Evaluation of the known mass in the uterus is limited without IV contrast. There is mass-like expansion centered in the lower uterine segment with heterogeneous fluid in the uterine fundus. Findings are similar to CT dated 11/06/23, given the difference is in technique. 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 No acute osseous abnormality. No focal soft tissue abnormality. IMPRESSION: 1. Limited evaluation of the known uterine mass without IV contrast. Findings are similar to prior CT with contrast dated 11/06/23, given the difference in technique. 2. Interval increase in size of multiple  retroperitoneal lymph nodes. Electronically signed by: Norman Gatlin MD 12/14/2023 09:05 PM EDT RP Workstation: [id number]   Assessment and plan- Patient is a 68 y.o. female who returns to clinic for follow up of her:   Chemotherapy follow up- tolerated first cycle fairly well. Suspect brain fog and fatigue will improve over next couple of weeks.  Endometrial Cancer- stage IIIC1 - TxN1aM0. Grade 2 endometrioid endometrial carcinoma. Completed palliative radiation for ongoing heavy vaginal bleeding and hemorrhage s/p vaginal packing and removal. She has now received cycle 1 of carbo-taxol chemotherapy on 01/08/24 with Dr Melanee. Due to frailty, she received carbo AUC 4 with paclitaxel 135 mg/m2.  Anemia- secondary to ongoing vaginal bleeding and blood loss. Hmg improving based on today's labs. She will receive IV iron  today and as scheduled. Cancel transfusion for Friday.   Follow up as scheduled- la   Visit Diagnosis 1. Chemotherapy follow-up examination   2. Endometrial cancer (HCC)   3. Iron  deficiency anemia due to chronic blood loss     Tinnie Dawn, DNP, AGNP-C, [facility] [phone] (clinic) 01/13/2024

## 2024-01-14 ENCOUNTER — Inpatient Hospital Stay: Admitting: Nurse Practitioner

## 2024-01-14 ENCOUNTER — Inpatient Hospital Stay

## 2024-01-15 ENCOUNTER — Ambulatory Visit: Admitting: Nurse Practitioner

## 2024-01-15 ENCOUNTER — Encounter

## 2024-01-15 ENCOUNTER — Inpatient Hospital Stay

## 2024-01-18 ENCOUNTER — Inpatient Hospital Stay

## 2024-01-18 VITALS — BP 104/52 | HR 79 | Temp 95.0°F | Resp 18

## 2024-01-18 DIAGNOSIS — D649 Anemia, unspecified: Secondary | ICD-10-CM

## 2024-01-18 DIAGNOSIS — Z5112 Encounter for antineoplastic immunotherapy: Secondary | ICD-10-CM | POA: Diagnosis not present

## 2024-01-18 MED ORDER — SODIUM CHLORIDE 0.9% FLUSH
10.0000 mL | Freq: Once | INTRAVENOUS | Status: AC | PRN
  Administered 2024-01-18: 10 mL
  Filled 2024-01-18: qty 10

## 2024-01-18 MED ORDER — IRON SUCROSE 20 MG/ML IV SOLN
200.0000 mg | INTRAVENOUS | Status: DC
  Administered 2024-01-18: 200 mg via INTRAVENOUS
  Filled 2024-01-18: qty 10

## 2024-01-25 ENCOUNTER — Inpatient Hospital Stay

## 2024-01-25 VITALS — BP 101/84 | HR 79 | Temp 96.0°F | Resp 18

## 2024-01-25 DIAGNOSIS — D649 Anemia, unspecified: Secondary | ICD-10-CM

## 2024-01-25 DIAGNOSIS — Z5112 Encounter for antineoplastic immunotherapy: Secondary | ICD-10-CM | POA: Diagnosis not present

## 2024-01-25 MED ORDER — IRON SUCROSE 20 MG/ML IV SOLN
200.0000 mg | INTRAVENOUS | Status: DC
  Administered 2024-01-25: 200 mg via INTRAVENOUS
  Filled 2024-01-25: qty 10

## 2024-01-25 NOTE — Patient Instructions (Signed)

## 2024-01-29 ENCOUNTER — Inpatient Hospital Stay (HOSPITAL_BASED_OUTPATIENT_CLINIC_OR_DEPARTMENT_OTHER): Admitting: Oncology

## 2024-01-29 ENCOUNTER — Inpatient Hospital Stay

## 2024-01-29 ENCOUNTER — Other Ambulatory Visit: Payer: Self-pay

## 2024-01-29 ENCOUNTER — Encounter: Payer: Self-pay | Admitting: Oncology

## 2024-01-29 VITALS — BP 99/59 | HR 84 | Temp 98.6°F | Resp 20 | Wt 114.3 lb

## 2024-01-29 VITALS — BP 99/57 | HR 83 | Resp 16

## 2024-01-29 DIAGNOSIS — D509 Iron deficiency anemia, unspecified: Secondary | ICD-10-CM | POA: Diagnosis not present

## 2024-01-29 DIAGNOSIS — Z5111 Encounter for antineoplastic chemotherapy: Secondary | ICD-10-CM

## 2024-01-29 DIAGNOSIS — C541 Malignant neoplasm of endometrium: Secondary | ICD-10-CM

## 2024-01-29 DIAGNOSIS — Z5112 Encounter for antineoplastic immunotherapy: Secondary | ICD-10-CM | POA: Diagnosis not present

## 2024-01-29 LAB — CMP (CANCER CENTER ONLY)
ALT: 11 U/L (ref 0–44)
AST: 25 U/L (ref 15–41)
Albumin: 3.1 g/dL — ABNORMAL LOW (ref 3.5–5.0)
Alkaline Phosphatase: 80 U/L (ref 38–126)
Anion gap: 9 (ref 5–15)
BUN: 9 mg/dL (ref 8–23)
CO2: 25 mmol/L (ref 22–32)
Calcium: 8.6 mg/dL — ABNORMAL LOW (ref 8.9–10.3)
Chloride: 105 mmol/L (ref 98–111)
Creatinine: 0.41 mg/dL — ABNORMAL LOW (ref 0.44–1.00)
GFR, Estimated: >60 mL/min (ref >60)
Glucose, Bld: 111 mg/dL — ABNORMAL HIGH (ref 70–99)
Potassium: 3.7 mmol/L (ref 3.5–5.1)
Sodium: 139 mmol/L (ref 135–145)
Total Bilirubin: 0.4 mg/dL (ref 0.0–1.2)
Total Protein: 6.8 g/dL (ref 6.5–8.1)

## 2024-01-29 LAB — CBC WITH DIFFERENTIAL (CANCER CENTER ONLY)
Abs Immature Granulocytes: 0.04 K/uL (ref 0.00–0.07)
Basophils Absolute: 0.1 K/uL (ref 0.0–0.1)
Basophils Relative: 1 %
Eosinophils Absolute: 0.1 K/uL (ref 0.0–0.5)
Eosinophils Relative: 1 %
HCT: 33 % — ABNORMAL LOW (ref 36.0–46.0)
Hemoglobin: 10.1 g/dL — ABNORMAL LOW (ref 12.0–15.0)
Immature Granulocytes: 1 %
Lymphocytes Relative: 36 %
Lymphs Abs: 1.7 K/uL (ref 0.7–4.0)
MCH: 29.1 pg (ref 26.0–34.0)
MCHC: 30.6 g/dL (ref 30.0–36.0)
MCV: 95.1 fL (ref 80.0–100.0)
Monocytes Absolute: 0.5 K/uL (ref 0.1–1.0)
Monocytes Relative: 10 %
Neutro Abs: 2.3 K/uL (ref 1.7–7.7)
Neutrophils Relative %: 51 %
Platelet Count: 315 K/uL (ref 150–400)
RBC: 3.47 MIL/uL — ABNORMAL LOW (ref 3.87–5.11)
RDW: 21 % — ABNORMAL HIGH (ref 11.5–15.5)
WBC Count: 4.6 K/uL (ref 4.0–10.5)
nRBC: 0 % (ref 0.0–0.2)

## 2024-01-29 MED ORDER — SODIUM CHLORIDE 0.9 % IV SOLN
200.0000 mg | Freq: Once | INTRAVENOUS | Status: AC
  Administered 2024-01-29: 200 mg via INTRAVENOUS
  Filled 2024-01-29: qty 8

## 2024-01-29 MED ORDER — SODIUM CHLORIDE 0.9 % IV SOLN
135.0000 mg/m2 | Freq: Once | INTRAVENOUS | Status: AC
  Administered 2024-01-29: 198 mg via INTRAVENOUS
  Filled 2024-01-29: qty 33

## 2024-01-29 MED ORDER — SODIUM CHLORIDE 0.9 % IV SOLN
INTRAVENOUS | Status: DC
  Filled 2024-01-29: qty 250

## 2024-01-29 MED ORDER — DIPHENHYDRAMINE HCL 50 MG/ML IJ SOLN
50.0000 mg | Freq: Once | INTRAMUSCULAR | Status: AC
  Administered 2024-01-29: 50 mg via INTRAVENOUS
  Filled 2024-01-29: qty 1

## 2024-01-29 MED ORDER — FAMOTIDINE IN NACL 20-0.9 MG/50ML-% IV SOLN
20.0000 mg | Freq: Once | INTRAVENOUS | Status: AC
  Administered 2024-01-29: 20 mg via INTRAVENOUS
  Filled 2024-01-29: qty 50

## 2024-01-29 MED ORDER — DEXAMETHASONE SOD PHOSPHATE PF 10 MG/ML IJ SOLN
10.0000 mg | Freq: Once | INTRAMUSCULAR | Status: AC
  Administered 2024-01-29: 10 mg via INTRAVENOUS

## 2024-01-29 MED ORDER — SODIUM CHLORIDE 0.9 % IV SOLN
336.5000 mg | Freq: Once | INTRAVENOUS | Status: AC
  Administered 2024-01-29: 340 mg via INTRAVENOUS
  Filled 2024-01-29: qty 34

## 2024-01-29 MED ORDER — PALONOSETRON HCL INJECTION 0.25 MG/5ML
0.2500 mg | Freq: Once | INTRAVENOUS | Status: AC
  Administered 2024-01-29: 0.25 mg via INTRAVENOUS
  Filled 2024-01-29: qty 5

## 2024-01-29 MED ORDER — APREPITANT 130 MG/18ML IV EMUL
130.0000 mg | Freq: Once | INTRAVENOUS | Status: AC
  Administered 2024-01-29: 130 mg via INTRAVENOUS
  Filled 2024-01-29: qty 18

## 2024-01-29 NOTE — Progress Notes (Signed)
 Hematology/Oncology Consult note [facility]  [phone] [phone]  Patient Care Team: Pcp, No as PCP - General Maurie Rayfield BIRCH, RN as Oncology Nurse Navigator Melanee Annah BROCKS, MD as Consulting Physician (Oncology)   Name of the patient: Valerie Leonard  [id number]  04/20/1955   Date of visit: 01/29/24  Diagnosis-  Cancer Staging  Endometrial cancer [facility]) Staging form: Corpus Uteri - Carcinoma and Carcinosarcoma, AJCC 8th Edition and FIGO 2023 - Clinical stage from 12/02/2023: FIGO Stage IIIC1, calculated as Stage Unknown (cTX, cN1a, cM0) - Signed by Melanee Annah BROCKS, MD on 12/02/2023 Histologic grade (G): G2 Histologic grading system: 3 grade system - Pathologic stage from 12/02/2023: FIGO Stage IIIC - Unsigned Histopathologic type: Endometrioid adenocarcinoma, NOS Stage prefix: Initial diagnosis    Chief complaint/ Reason for visit-on treatment assessment prior to cycle 2 of CarboTaxol Keytruda chemotherapy  Heme/Onc history: Patient is a 68 year old female who presented with post menopausal bleeding in June 2025.  Her hemoglobin had dropped down to 5.8 requiring PRBC transfusion.   Pelvic ultrasound in June 2025 showed large amount of blood products throughout the endometrial panel.  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  Wild-type staining for p53.  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  Enlarged bilateral iliac sided chain and pelvic sidewall lymph nodes compatible with nodal disease involvement.  Scattered pulmonary nodules measuring  up to 6 mm nonspecific but suspicious for metastatic disease.   Patient completed a course of palliative radiation to her vagina.  She is presently on [place] chemotherapy    Interval history- Discussed the use of AI scribe software for clinical note transcription with the patient, who gave verbal consent to proceed.  Valerie Leonard is a 68 year old female with endometrial cancer who presents for her second cycle of chemotherapy.  She has completed five iron  treatments, which have improved her energy levels. Her hemoglobin has increased from 9.1 to 10.1 over the past three weeks. No ongoing vaginal bleeding, which had previously been managed with radiation and chemotherapy.  She is currently residing in a rehabilitation facility and is planning to transition to assisted living. She reports improved nutrition, eating eggs and pancakes for breakfast, and feels better overall. She has experienced hair loss due to chemotherapy.      ECOG PS- 2 Pain scale- 0 Opioid associated constipation- no  Review of systems- Review of Systems  Constitutional:  Negative for chills, fever, malaise/fatigue and weight loss.  HENT:  Negative for congestion, ear discharge and nosebleeds.   Eyes:  Negative for blurred vision.  Respiratory:  Negative for cough, hemoptysis, sputum production, shortness of breath and wheezing.   Cardiovascular:  Negative for chest pain, palpitations, orthopnea and claudication.  Gastrointestinal:  Negative for abdominal pain, blood in stool, constipation, diarrhea, heartburn, melena, nausea and vomiting.  Genitourinary:  Negative for dysuria, flank pain, frequency, hematuria and urgency.  Musculoskeletal:  Negative for back pain, joint pain and myalgias.  Skin:  Negative for rash.  Neurological:  Negative for dizziness, tingling, focal weakness, seizures, weakness and headaches.  Endo/Heme/Allergies:  Does not bruise/bleed easily.  Psychiatric/Behavioral:  Negative for  depression and suicidal ideas. The patient does not have insomnia.       No Known Allergies   Past Medical History:  Diagnosis Date   Acute delirium    Acute metabolic encephalopathy    Anemia    blood loss anemia   Chronic alcohol abuse    Depression    Endometrial cancer (HCC)    Hypokalemia    Hypomagnesemia    Pressure injury of skin    Severe major depression, single episode, without psychotic features (HCC)    Vaginal bleeding      Past Surgical History:  Procedure Laterality Date   IR IMAGING GUIDED PORT INSERTION  12/17/2023   IR PATIENT EVAL TECH 0-60 MINS  12/10/2023   NO PAST SURGERIES      Social History   Socioeconomic History   Marital status: Single    Spouse name: Not on file   Number of children: 0   Years of education: Not on file   Highest education level: Not on file  Occupational History   Not on file  Tobacco Use   Smoking status: Former    Types: Cigarettes, Cigars   Smokeless tobacco: Never  Vaping Use   Vaping status: Never Used  Substance and Sexual Activity   Alcohol use: Yes    Alcohol/week: 3.0 standard drinks of alcohol    Types: 3 Cans of beer per week    Comment: More than 4 glasses of wine, mixed drinks, beer daily.    Drug use: Not Currently    Comment: former use of marijuana   Sexual activity: Not Currently    Birth control/protection: None  Other Topics Concern   Not on file  Social History Narrative   Lives by herself    Social Drivers of Health   Financial Resource Strain: Low Risk  (10/13/2023)   Received from [facility]   Overall Financial Resource Strain (CARDIA)    Difficulty of Paying Living Expenses: Not very hard  Food Insecurity: No Food Insecurity (12/10/2023)   Hunger Vital Sign    Worried About Running Out of Food in the Last Year: Never true    Ran Out of Food in the Last Year: Never true  Recent Concern: Food Insecurity - Food Insecurity Present (09/27/2023)   Hunger Vital Sign     Worried About Running Out of Food in the Last Year: Sometimes true    Ran Out of Food in the Last Year: Sometimes true  Transportation Needs: No Transportation Needs (12/10/2023)   PRAPARE - Administrator, Civil Service (Medical): No    Lack of Transportation (Non-Medical): No  Recent Concern: Transportation Needs - Unmet Transportation Needs (09/27/2023)   PRAPARE - Administrator, Civil Service (Medical): Yes    Lack of Transportation (Non-Medical): Yes  Physical Activity: Not on file  Stress: Not on file  Social Connections: Unknown (12/11/2023)   Social Connection and Isolation Panel    Frequency of Communication with Friends and Family: More than three times a week    Frequency of Social Gatherings with Friends and Family: More than three times a week    Attends Religious Services: Patient declined    Database administrator or Organizations: Patient declined    Attends Banker Meetings: Patient declined    Marital Status: Widowed  Recent Concern: Social Connections - Socially Isolated (09/28/2023)   Social Connection and Isolation Panel    Frequency of Communication with Friends  and Family: Never    Frequency of Social Gatherings with Friends and Family: Never    Attends Religious Services: Never    Database administrator or Organizations: No    Attends Banker Meetings: Never    Marital Status: Widowed  Intimate Partner Violence: Not At Risk (12/10/2023)   Humiliation, Afraid, Rape, and Kick questionnaire    Fear of Current or Ex-Partner: No    Emotionally Abused: No    Physically Abused: No    Sexually Abused: No    History reviewed. No pertinent family history.   Current Outpatient Medications:    ALPRAZolam  (XANAX ) 0.25 MG tablet, Take 1 tablet (0.25 mg total) by mouth 2 (two) times daily as needed for anxiety., Disp: 10 tablet, Rfl: 0   amoxicillin -clavulanate (AUGMENTIN ) 500-125 MG tablet, Take 1 tablet by mouth 2 (two)  times daily., Disp: , Rfl:    diphenhydrAMINE  (BENADRYL ) 50 MG capsule, Take 1 capsule (50 mg total) by mouth at bedtime as needed for itching, allergies or sleep., Disp: , Rfl:    escitalopram  (LEXAPRO ) 5 MG tablet, Take 1 tablet every day by oral route in the morning, for depression., Disp: , Rfl:    ferrous sulfate  325 (65 FE) MG tablet, Take 1 tablet (325 mg total) by mouth daily with breakfast., Disp: , Rfl:    folic acid  (FOLVITE ) 1 MG tablet, Take 1 tablet (1 mg total) by mouth daily., Disp: 30 tablet, Rfl: 0   Multiple Vitamin (MULTIVITAMIN WITH MINERALS) TABS tablet, Take 1 tablet by mouth daily., Disp: 30 tablet, Rfl: 1   ondansetron  (ZOFRAN ) 8 MG tablet, Take 1 tablet (8 mg total) by mouth every 8 (eight) hours as needed for nausea or vomiting. Start on the third day after carboplatin., Disp: 30 tablet, Rfl: 1   traZODone (DESYREL) 50 MG tablet, Take 50 mg by mouth at bedtime., Disp: , Rfl:    [Paused] dexamethasone  (DECADRON ) 4 MG tablet, Take 2 tablets (8mg ) by mouth daily starting the day after carboplatin for 3 days. Take with food, Disp: 30 tablet, Rfl: 1   feeding supplement (ENSURE PLUS HIGH PROTEIN) LIQD, Take 237 mLs by mouth 3 (three) times daily between meals. (Patient not taking: Reported on 01/29/2024), Disp: , Rfl:    lidocaine -prilocaine  (EMLA ) cream, Apply to affected area once (Patient not taking: Reported on 01/29/2024), Disp: 30 g, Rfl: 3   oxyCODONE  (OXY IR/ROXICODONE ) 5 MG immediate release tablet, Take 1 tablet (5 mg total) by mouth every 4 (four) hours as needed for severe pain (pain score 7-10)., Disp: 60 tablet, Rfl: 0   prochlorperazine  (COMPAZINE ) 10 MG tablet, Take 1 tablet (10 mg total) by mouth every 6 (six) hours as needed for nausea or vomiting., Disp: 30 tablet, Rfl: 1 No current facility-administered medications for this visit.  Facility-Administered Medications Ordered in Other Visits:    0.9 %  sodium chloride  infusion, , Intravenous, Continuous, Melanee Annah BROCKS, MD, Last Rate: 10 mL/hr at 01/29/24 0933, New Bag at 01/29/24 0933   CARBOplatin (PARAPLATIN) 340 mg in sodium chloride  0.9 % 100 mL chemo infusion, 340 mg, Intravenous, Once, Jesi Jurgens C, MD   PACLitaxel (TAXOL) 198 mg in sodium chloride  0.9 % 250 mL chemo infusion (> 80mg /m2), 135 mg/m2 (Treatment Plan Recorded), Intravenous, Once, Melanee Annah BROCKS, MD, Last Rate: 94 mL/hr at 01/29/24 1102, 198 mg at 01/29/24 1102  Physical exam:  Vitals:   01/29/24 0835  BP: (!) 99/59  Pulse: 84  Resp: 20  Temp: 98.6 F (37 C)  SpO2: 100%  Weight: 114 lb 4.8 oz (51.8 kg)   Physical Exam Constitutional:      Comments: Sitting in a wheelchair.  Appears in no acute distress  Cardiovascular:     Rate and Rhythm: Normal rate and regular rhythm.     Heart sounds: Normal heart sounds.  Pulmonary:     Effort: Pulmonary effort is normal.     Breath sounds: Normal breath sounds.  Skin:    General: Skin is warm and dry.  Neurological:     Mental Status: She is alert and oriented to person, place, and time.      I have personally reviewed labs listed below:    Latest Ref Rng & Units 01/29/2024    8:18 AM  CMP  Glucose 70 - 99 mg/dL 888   BUN 8 - 23 mg/dL 9   Creatinine 9.55 - 8.99 mg/dL 9.58   Sodium 864 - 854 mmol/L 139   Potassium 3.5 - 5.1 mmol/L 3.7   Chloride 98 - 111 mmol/L 105   CO2 22 - 32 mmol/L 25   Calcium  8.9 - 10.3 mg/dL 8.6   Total Protein 6.5 - 8.1 g/dL 6.8   Total Bilirubin 0.0 - 1.2 mg/dL 0.4   Alkaline Phos 38 - 126 U/L 80   AST 15 - 41 U/L 25   ALT 0 - 44 U/L 11       Latest Ref Rng & Units 01/29/2024    8:18 AM  CBC  WBC 4.0 - 10.5 K/uL 4.6   Hemoglobin 12.0 - 15.0 g/dL 89.8   Hematocrit 63.9 - 46.0 % 33.0   Platelets 150 - 400 K/uL 315      Assessment and plan- Patient is a 68 y.o. female with history of stage IIIc endometrioid endometrial carcinoma here for on treatment assessment prior to cycle 2 of CarboTaxol Keytruda chemotherapy      Endometrial cancer, actively treated with chemotherapy and immunotherapy Undergoing second cycle of chemotherapy with carboplatin, taxotere, and Keytruda. First cycle well-tolerated with minimal side effects. Blood work stable, no vaginal bleeding, indicating positive response. Plan to complete six cycles, then continue Keytruda alone. - Administer second cycle of chemotherapy with slightly increased dose of carboplatin increased from AUC 4 to AUC 5.  Continue Taxol at 135 mg/m for cycle 2 - Plan CT scan after one or two more cycles to assess response. - Discontinue chemotherapy after six cycles and continue Keytruda alone every three weeks.  Iron  deficiency anemia secondary to cancer and chemotherapy (improving) Anemia improving with increased hemoglobin from 9.1 to 10.1. Completed five iron  treatments. No current bleeding. - Repeat iron  levels in six weeks.         Visit Diagnosis 1. Endometrial cancer (HCC)   2. Encounter for antineoplastic chemotherapy   3. Encounter for antineoplastic immunotherapy   4. Iron  deficiency anemia, unspecified iron  deficiency anemia type      Dr. Annah Skene, MD, MPH [facility] [phone] 01/29/2024 12:24 PM

## 2024-01-29 NOTE — Patient Instructions (Signed)
 [facility] - A DEPT OF [facility]  Discharge Instructions: Thank you for choosing [facility] to provide your oncology and hematology care.  If you have a lab appointment with the Cancer Center, please go directly to the Cancer Center and check in at the registration area.  Wear comfortable clothing and clothing appropriate for easy access to any Portacath or PICC line.   We strive to give you quality time with your provider. You may need to reschedule your appointment if you arrive late (15 or more minutes).  Arriving late affects you and other patients whose appointments are after yours.  Also, if you miss three or more appointments without notifying the office, you may be dismissed from the clinic at the provider's discretion.      For prescription refill requests, have your pharmacy contact our office and allow 72 hours for refills to be completed.    Today you received the following chemotherapy and/or immunotherapy agents taxol /carboplatin       To help prevent nausea and vomiting after your treatment, we encourage you to take your nausea medication as directed.  BELOW ARE SYMPTOMS THAT SHOULD BE REPORTED IMMEDIATELY: *FEVER GREATER THAN 100.4 F (38 C) OR HIGHER *CHILLS OR SWEATING *NAUSEA AND VOMITING THAT IS NOT CONTROLLED WITH YOUR NAUSEA MEDICATION *UNUSUAL SHORTNESS OF BREATH *UNUSUAL BRUISING OR BLEEDING *URINARY PROBLEMS (pain or burning when urinating, or frequent urination) *BOWEL PROBLEMS (unusual diarrhea, constipation, pain near the anus) TENDERNESS IN MOUTH AND THROAT WITH OR WITHOUT PRESENCE OF ULCERS (sore throat, sores in mouth, or a toothache) UNUSUAL RASH, SWELLING OR PAIN  UNUSUAL VAGINAL DISCHARGE OR ITCHING   Items with * indicate a potential emergency and should be followed up as soon as possible or go to the Emergency Department if any problems should occur.  Please show the CHEMOTHERAPY ALERT CARD or  IMMUNOTHERAPY ALERT CARD at check-in to the Emergency Department and triage nurse.  Should you have questions after your visit or need to cancel or reschedule your appointment, please contact [facility] - A DEPT OF [facility]  [phone] and follow the prompts.  Office hours are 8:00 a.m. to 4:30 p.m. Monday - Friday. Please note that voicemails left after 4:00 p.m. may not be returned until the following business day.  We are closed weekends and major holidays. You have access to a nurse at all times for urgent questions. Please call the main number to the clinic [phone] and follow the prompts.  For any non-urgent questions, you may also contact your provider using MyChart. We now offer e-Visits for anyone 19 and older to request care online for non-urgent symptoms. For details visit [email or web address].   Also download the MyChart app! Go to the app store, search MyChart, open the app, select [facility], and log in with your MyChart username and password.

## 2024-01-31 ENCOUNTER — Other Ambulatory Visit: Payer: Self-pay

## 2024-02-01 ENCOUNTER — Inpatient Hospital Stay

## 2024-02-02 ENCOUNTER — Other Ambulatory Visit: Payer: Self-pay

## 2024-02-03 ENCOUNTER — Ambulatory Visit: Admitting: Radiation Oncology

## 2024-02-08 ENCOUNTER — Inpatient Hospital Stay

## 2024-02-14 ENCOUNTER — Other Ambulatory Visit: Payer: Self-pay

## 2024-02-15 ENCOUNTER — Ambulatory Visit: Admitting: Radiation Oncology

## 2024-02-19 ENCOUNTER — Inpatient Hospital Stay: Attending: Obstetrics and Gynecology

## 2024-02-19 ENCOUNTER — Inpatient Hospital Stay

## 2024-02-19 ENCOUNTER — Encounter: Payer: Self-pay | Admitting: Oncology

## 2024-02-19 ENCOUNTER — Inpatient Hospital Stay: Attending: Obstetrics and Gynecology | Admitting: Oncology

## 2024-02-19 ENCOUNTER — Other Ambulatory Visit: Payer: Self-pay

## 2024-02-19 VITALS — BP 115/57 | HR 88 | Temp 97.8°F | Resp 17 | Ht 61.0 in | Wt 106.7 lb

## 2024-02-19 VITALS — BP 99/60 | HR 82

## 2024-02-19 DIAGNOSIS — G4700 Insomnia, unspecified: Secondary | ICD-10-CM | POA: Insufficient documentation

## 2024-02-19 DIAGNOSIS — Z79899 Other long term (current) drug therapy: Secondary | ICD-10-CM | POA: Diagnosis not present

## 2024-02-19 DIAGNOSIS — D630 Anemia in neoplastic disease: Secondary | ICD-10-CM | POA: Insufficient documentation

## 2024-02-19 DIAGNOSIS — Z87891 Personal history of nicotine dependence: Secondary | ICD-10-CM | POA: Diagnosis not present

## 2024-02-19 DIAGNOSIS — F32A Depression, unspecified: Secondary | ICD-10-CM | POA: Insufficient documentation

## 2024-02-19 DIAGNOSIS — C541 Malignant neoplasm of endometrium: Secondary | ICD-10-CM

## 2024-02-19 DIAGNOSIS — Z79620 Long term (current) use of immunosuppressive biologic: Secondary | ICD-10-CM | POA: Insufficient documentation

## 2024-02-19 DIAGNOSIS — Z5112 Encounter for antineoplastic immunotherapy: Secondary | ICD-10-CM | POA: Diagnosis present

## 2024-02-19 DIAGNOSIS — G4709 Other insomnia: Secondary | ICD-10-CM

## 2024-02-19 DIAGNOSIS — Z5111 Encounter for antineoplastic chemotherapy: Secondary | ICD-10-CM | POA: Diagnosis present

## 2024-02-19 LAB — CMP (CANCER CENTER ONLY)
ALT: 9 U/L (ref 0–44)
AST: 21 U/L (ref 15–41)
Albumin: 3.8 g/dL (ref 3.5–5.0)
Alkaline Phosphatase: 78 U/L (ref 38–126)
Anion gap: 10 (ref 5–15)
BUN: 11 mg/dL (ref 8–23)
CO2: 25 mmol/L (ref 22–32)
Calcium: 9.2 mg/dL (ref 8.9–10.3)
Chloride: 102 mmol/L (ref 98–111)
Creatinine: 0.49 mg/dL (ref 0.44–1.00)
GFR, Estimated: >60 mL/min (ref >60)
Glucose, Bld: 89 mg/dL (ref 70–99)
Potassium: 3.7 mmol/L (ref 3.5–5.1)
Sodium: 137 mmol/L (ref 135–145)
Total Bilirubin: 0.6 mg/dL (ref 0.0–1.2)
Total Protein: 7.5 g/dL (ref 6.5–8.1)

## 2024-02-19 LAB — CBC WITH DIFFERENTIAL (CANCER CENTER ONLY)
Abs Immature Granulocytes: 0.04 K/uL (ref 0.00–0.07)
Basophils Absolute: 0.1 K/uL (ref 0.0–0.1)
Basophils Relative: 1 %
Eosinophils Absolute: 0 K/uL (ref 0.0–0.5)
Eosinophils Relative: 1 %
HCT: 35.1 % — ABNORMAL LOW (ref 36.0–46.0)
Hemoglobin: 11.4 g/dL — ABNORMAL LOW (ref 12.0–15.0)
Immature Granulocytes: 1 %
Lymphocytes Relative: 33 %
Lymphs Abs: 2.2 K/uL (ref 0.7–4.0)
MCH: 30.9 pg (ref 26.0–34.0)
MCHC: 32.5 g/dL (ref 30.0–36.0)
MCV: 95.1 fL (ref 80.0–100.0)
Monocytes Absolute: 0.8 K/uL (ref 0.1–1.0)
Monocytes Relative: 11 %
Neutro Abs: 3.7 K/uL (ref 1.7–7.7)
Neutrophils Relative %: 53 %
Platelet Count: 223 K/uL (ref 150–400)
RBC: 3.69 MIL/uL — ABNORMAL LOW (ref 3.87–5.11)
RDW: 17.4 % — ABNORMAL HIGH (ref 11.5–15.5)
WBC Count: 6.8 K/uL (ref 4.0–10.5)
nRBC: 0 % (ref 0.0–0.2)

## 2024-02-19 LAB — TSH: TSH: 1.54 u[IU]/mL (ref 0.350–4.500)

## 2024-02-19 MED ORDER — SODIUM CHLORIDE 0.9 % IV SOLN
336.5000 mg | Freq: Once | INTRAVENOUS | Status: AC
  Administered 2024-02-19: 340 mg via INTRAVENOUS
  Filled 2024-02-19: qty 34

## 2024-02-19 MED ORDER — SODIUM CHLORIDE 0.9 % IV SOLN
INTRAVENOUS | Status: DC
  Filled 2024-02-19: qty 250

## 2024-02-19 MED ORDER — SODIUM CHLORIDE 0.9 % IV SOLN
135.0000 mg/m2 | Freq: Once | INTRAVENOUS | Status: AC
  Administered 2024-02-19: 198 mg via INTRAVENOUS
  Filled 2024-02-19: qty 33

## 2024-02-19 MED ORDER — SODIUM CHLORIDE 0.9 % IV SOLN
200.0000 mg | Freq: Once | INTRAVENOUS | Status: AC
  Administered 2024-02-19: 200 mg via INTRAVENOUS
  Filled 2024-02-19: qty 8

## 2024-02-19 MED ORDER — DIPHENHYDRAMINE HCL 50 MG/ML IJ SOLN
50.0000 mg | Freq: Once | INTRAMUSCULAR | Status: DC
  Filled 2024-02-19: qty 1

## 2024-02-19 MED ORDER — FAMOTIDINE IN NACL 20-0.9 MG/50ML-% IV SOLN
20.0000 mg | Freq: Once | INTRAVENOUS | Status: AC
  Administered 2024-02-19: 20 mg via INTRAVENOUS
  Filled 2024-02-19: qty 50

## 2024-02-19 MED ORDER — DIPHENHYDRAMINE HCL 50 MG/ML IJ SOLN
25.0000 mg | Freq: Once | INTRAMUSCULAR | Status: AC
  Administered 2024-02-19: 25 mg via INTRAVENOUS

## 2024-02-19 MED ORDER — DEXAMETHASONE SOD PHOSPHATE PF 10 MG/ML IJ SOLN
10.0000 mg | Freq: Once | INTRAMUSCULAR | Status: AC
  Administered 2024-02-19: 10 mg via INTRAVENOUS

## 2024-02-19 MED ORDER — ESCITALOPRAM OXALATE 5 MG PO TABS: 5.0000 mg | ORAL_TABLET | Freq: Every day | ORAL | 0 refills | Status: AC

## 2024-02-19 MED ORDER — APREPITANT 130 MG/18ML IV EMUL
130.0000 mg | Freq: Once | INTRAVENOUS | Status: AC
  Administered 2024-02-19: 130 mg via INTRAVENOUS
  Filled 2024-02-19: qty 18

## 2024-02-19 MED ORDER — PALONOSETRON HCL INJECTION 0.25 MG/5ML
0.2500 mg | Freq: Once | INTRAVENOUS | Status: AC
  Administered 2024-02-19: 0.25 mg via INTRAVENOUS
  Filled 2024-02-19: qty 5

## 2024-02-19 MED ORDER — TRAZODONE HCL 50 MG PO TABS: 50.0000 mg | ORAL_TABLET | Freq: Every day | ORAL | 0 refills | Status: AC

## 2024-02-19 NOTE — Patient Instructions (Signed)

## 2024-02-19 NOTE — Progress Notes (Signed)
 Would like trazadone prescribed at night (currently taking).  Also asking for either xanax  or lexapro  (not prescribed by [facility]). Pain 4/10 especially left knee post PT.

## 2024-02-19 NOTE — Progress Notes (Signed)
 Hematology/Oncology Consult note [facility]  [phone] [phone]  Patient Care Team: Pcp, No as PCP - General Maurie Rayfield BIRCH, RN as Oncology Nurse Navigator Melanee Annah BROCKS, MD as Consulting Physician (Oncology)   Name of the patient: Valerie Leonard  [id number]  1955/10/25   Date of visit: 02/19/24  Diagnosis-  Cancer Staging  Endometrial cancer [facility]) Staging form: Corpus Uteri - Carcinoma and Carcinosarcoma, AJCC 8th Edition and FIGO 2023 - Clinical stage from 12/02/2023: FIGO Stage IIIC1, calculated as Stage Unknown (cTX, cN1a, cM0) - Signed by Melanee Annah BROCKS, MD on 12/02/2023 Histologic grade (G): G2 Histologic grading system: 3 grade system - Pathologic stage from 12/02/2023: FIGO Stage IIIC - Unsigned Histopathologic type: Endometrioid adenocarcinoma, NOS Stage prefix: Initial diagnosis    Chief complaint/ Reason for visit-on treatment assessment prior to cycle 3 of CarboTaxol Keytruda chemotherapy  Heme/Onc history: Patient is a 68 year old female who presented with post menopausal bleeding in June 2025.  Her hemoglobin had dropped down to 5.8 requiring PRBC transfusion.   Pelvic ultrasound in June 2025 showed large amount of blood products throughout the endometrial panel.  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  Wild-type staining for p53.  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  Enlarged bilateral iliac sided chain and pelvic sidewall lymph nodes compatible with nodal disease involvement.  Scattered pulmonary nodules measuring  up to 6 mm nonspecific but suspicious for metastatic disease.   Patient completed a course of palliative radiation to her vagina.  She is presently on CarboTaxol Keytruda chemotherapy      Interval history-overall tolerating treatments well without any significant nausea or vomiting diarrhea or peripheral neuropathy.  She would like a refill of Lexapro  for her depression and trazodone for sleep.  Denies any vaginal bleeding  ECOG PS- 2 Pain scale- 0   Review of systems- Review of Systems  Constitutional:  Negative for chills, fever, malaise/fatigue and weight loss.  HENT:  Negative for congestion, ear discharge and nosebleeds.   Eyes:  Negative for blurred vision.  Respiratory:  Negative for cough, hemoptysis, sputum production, shortness of breath and wheezing.   Cardiovascular:  Negative for chest pain, palpitations, orthopnea and claudication.  Gastrointestinal:  Negative for abdominal pain, blood in stool, constipation, diarrhea, heartburn, melena, nausea and vomiting.  Genitourinary:  Negative for dysuria, flank pain, frequency, hematuria and urgency.  Musculoskeletal:  Negative for back pain, joint pain and myalgias.  Skin:  Negative for rash.  Neurological:  Negative for dizziness, tingling, focal weakness, seizures, weakness and headaches.  Endo/Heme/Allergies:  Does not bruise/bleed easily.  Psychiatric/Behavioral:  Negative for depression and suicidal ideas. The patient does not have insomnia.       No Known Allergies   Past Medical History:  Diagnosis Date   Acute delirium    Acute metabolic encephalopathy    Anemia    blood loss anemia   Chronic alcohol abuse    Depression    Endometrial cancer (HCC)    Hypokalemia    Hypomagnesemia    Pressure injury of skin    Severe major depression, single episode, without psychotic features (HCC)  Vaginal bleeding      Past Surgical History:  Procedure Laterality Date   IR IMAGING GUIDED PORT INSERTION  12/17/2023    IR PATIENT EVAL TECH 0-60 MINS  12/10/2023   NO PAST SURGERIES      Social History   Socioeconomic History   Marital status: Single    Spouse name: Not on file   Number of children: 0   Years of education: Not on file   Highest education level: Not on file  Occupational History   Not on file  Tobacco Use   Smoking status: Former    Types: Cigarettes, Cigars   Smokeless tobacco: Never  Vaping Use   Vaping status: Never Used  Substance and Sexual Activity   Alcohol use: Yes    Alcohol/week: 3.0 standard drinks of alcohol    Types: 3 Cans of beer per week    Comment: More than 4 glasses of wine, mixed drinks, beer daily.    Drug use: Not Currently    Comment: former use of marijuana   Sexual activity: Not Currently    Birth control/protection: None  Other Topics Concern   Not on file  Social History Narrative   Lives by herself    Social Drivers of Health   Financial Resource Strain: Low Risk  (10/13/2023)   Received from [facility]   Overall Financial Resource Strain (CARDIA)    Difficulty of Paying Living Expenses: Not very hard  Food Insecurity: No Food Insecurity (12/10/2023)   Hunger Vital Sign    Worried About Running Out of Food in the Last Year: Never true    Ran Out of Food in the Last Year: Never true  Recent Concern: Food Insecurity - Food Insecurity Present (09/27/2023)   Hunger Vital Sign    Worried About Running Out of Food in the Last Year: Sometimes true    Ran Out of Food in the Last Year: Sometimes true  Transportation Needs: No Transportation Needs (12/10/2023)   PRAPARE - Administrator, Civil Service (Medical): No    Lack of Transportation (Non-Medical): No  Recent Concern: Transportation Needs - Unmet Transportation Needs (09/27/2023)   PRAPARE - Administrator, Civil Service (Medical): Yes    Lack of Transportation (Non-Medical): Yes  Physical Activity: Not on file  Stress: Not on file  Social Connections:  Unknown (12/11/2023)   Social Connection and Isolation Panel    Frequency of Communication with Friends and Family: More than three times a week    Frequency of Social Gatherings with Friends and Family: More than three times a week    Attends Religious Services: Patient declined    Database Administrator or Organizations: Patient declined    Attends Banker Meetings: Patient declined    Marital Status: Widowed  Recent Concern: Social Connections - Socially Isolated (09/28/2023)   Social Connection and Isolation Panel    Frequency of Communication with Friends and Family: Never    Frequency of Social Gatherings with Friends and Family: Never    Attends Religious Services: Never    Database Administrator or Organizations: No    Attends Banker Meetings: Never    Marital Status: Widowed  Intimate Partner Violence: Not At Risk (12/10/2023)   Humiliation, Afraid, Rape, and Kick questionnaire    Fear of Current or Ex-Partner: No    Emotionally Abused: No    Physically Abused: No    Sexually Abused: No  No family history on file.   Current Outpatient Medications:    ALPRAZolam  (XANAX ) 0.25 MG tablet, Take 1 tablet (0.25 mg total) by mouth 2 (two) times daily as needed for anxiety., Disp: 10 tablet, Rfl: 0   amoxicillin -clavulanate (AUGMENTIN ) 500-125 MG tablet, Take 1 tablet by mouth 2 (two) times daily., Disp: , Rfl:    diphenhydrAMINE  (BENADRYL ) 50 MG capsule, Take 1 capsule (50 mg total) by mouth at bedtime as needed for itching, allergies or sleep., Disp: , Rfl:    ferrous sulfate  325 (65 FE) MG tablet, Take 1 tablet (325 mg total) by mouth daily with breakfast., Disp: , Rfl:    folic acid  (FOLVITE ) 1 MG tablet, Take 1 tablet (1 mg total) by mouth daily., Disp: 30 tablet, Rfl: 0   Multiple Vitamin (MULTIVITAMIN WITH MINERALS) TABS tablet, Take 1 tablet by mouth daily., Disp: 30 tablet, Rfl: 1   ondansetron  (ZOFRAN ) 8 MG tablet, Take 1 tablet (8 mg total) by  mouth every 8 (eight) hours as needed for nausea or vomiting. Start on the third day after carboplatin., Disp: 30 tablet, Rfl: 1   [Paused] dexamethasone  (DECADRON ) 4 MG tablet, Take 2 tablets (8mg ) by mouth daily starting the day after carboplatin for 3 days. Take with food (Patient not taking: Reported on 02/19/2024), Disp: 30 tablet, Rfl: 1   escitalopram  (LEXAPRO ) 5 MG tablet, Take 1 tablet (5 mg total) by mouth daily., Disp: 30 tablet, Rfl: 0   feeding supplement (ENSURE PLUS HIGH PROTEIN) LIQD, Take 237 mLs by mouth 3 (three) times daily between meals. (Patient not taking: Reported on 02/19/2024), Disp: , Rfl:    lidocaine -prilocaine  (EMLA ) cream, Apply to affected area once (Patient not taking: Reported on 01/29/2024), Disp: 30 g, Rfl: 3   oxyCODONE  (OXY IR/ROXICODONE ) 5 MG immediate release tablet, Take 1 tablet (5 mg total) by mouth every 4 (four) hours as needed for severe pain (pain score 7-10). (Patient not taking: Reported on 02/19/2024), Disp: 60 tablet, Rfl: 0   prochlorperazine  (COMPAZINE ) 10 MG tablet, Take 1 tablet (10 mg total) by mouth every 6 (six) hours as needed for nausea or vomiting. (Patient not taking: Reported on 02/19/2024), Disp: 30 tablet, Rfl: 1   traZODone (DESYREL) 50 MG tablet, Take 1 tablet (50 mg total) by mouth at bedtime., Disp: 30 tablet, Rfl: 0 No current facility-administered medications for this visit.  Facility-Administered Medications Ordered in Other Visits:    0.9 %  sodium chloride  infusion, , Intravenous, Continuous, Melanee Annah BROCKS, MD, Last Rate: 10 mL/hr at 02/19/24 1019, New Bag at 02/19/24 1019   CARBOplatin (PARAPLATIN) 340 mg in sodium chloride  0.9 % 100 mL chemo infusion, 340 mg, Intravenous, Once, Melanee Annah BROCKS, MD   PACLitaxel (TAXOL) 198 mg in sodium chloride  0.9 % 250 mL chemo infusion (> 80mg /m2), 135 mg/m2 (Treatment Plan Recorded), Intravenous, Once, Melanee Annah BROCKS, MD, Last Rate: 94 mL/hr at 02/19/24 1137, 198 mg at 02/19/24  1137  Physical exam:  Vitals:   02/19/24 0933 02/19/24 0934  BP: (!) 126/113 (!) 115/57  Pulse: 88   Resp: 17   Temp: 97.8 F (36.6 C)   TempSrc: Tympanic   SpO2: 100%   Weight: 106 lb 11.2 oz (48.4 kg)   Height: 5' 1 (1.549 m)    Physical Exam Constitutional:      Comments: Sitting in a wheelchair and appears in no acute distress  Eyes:     Pupils: Pupils are equal, round, and reactive to light.  Cardiovascular:  Rate and Rhythm: Normal rate and regular rhythm.     Heart sounds: Normal heart sounds.  Pulmonary:     Effort: Pulmonary effort is normal.     Breath sounds: Normal breath sounds.  Abdominal:     General: Bowel sounds are normal.     Palpations: Abdomen is soft.  Skin:    General: Skin is warm and dry.  Neurological:     Mental Status: She is alert and oriented to person, place, and time.      I have personally reviewed labs listed below:    Latest Ref Rng & Units 02/19/2024    9:00 AM  CMP  Glucose 70 - 99 mg/dL 89   BUN 8 - 23 mg/dL 11   Creatinine 9.55 - 1.00 mg/dL 9.50   Sodium 864 - 854 mmol/L 137   Potassium 3.5 - 5.1 mmol/L 3.7   Chloride 98 - 111 mmol/L 102   CO2 22 - 32 mmol/L 25   Calcium  8.9 - 10.3 mg/dL 9.2   Total Protein 6.5 - 8.1 g/dL 7.5   Total Bilirubin 0.0 - 1.2 mg/dL 0.6   Alkaline Phos 38 - 126 U/L 78   AST 15 - 41 U/L 21   ALT 0 - 44 U/L 9       Latest Ref Rng & Units 02/19/2024    9:00 AM  CBC  WBC 4.0 - 10.5 K/uL 6.8   Hemoglobin 12.0 - 15.0 g/dL 88.5   Hematocrit 63.9 - 46.0 % 35.1   Platelets 150 - 400 K/uL 223      Assessment and plan- Patient is a 68 y.o. female with history of stage IIIc endometrioid endometrial carcinoma here for on treatment assessment prior to cycle 3 of CarboTaxol Keytruda chemotherapy     Malignant neoplasm of endometrium with retroperitoneal and pelvic lymph node involvement and possible lung metastases Undergoing chemotherapy with improved blood work.  Counts okay to proceed with  cycle 3 of CarboTaxol Keytruda chemotherapy today.  Plan to repeat scans in about and see her back in 3 weeks for cycle - Coordinate with GYN oncology for further management.  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 Iron  levels adequate. No vaginal bleeding. - Repeat iron  levels in three weeks. - Monitor hemoglobin levels.     Insomnia.  Will refill trazodone today.  History of depression: Will refill Lexapro     Visit Diagnosis 1. Endometrial cancer (HCC)   2. Encounter for antineoplastic chemotherapy   3. Encounter for antineoplastic immunotherapy   4. Other insomnia      Dr. Annah Skene, MD, MPH [facility] [phone] 02/19/2024 12:19 PM

## 2024-02-20 LAB — T4: T4, Total: 7.5 ug/dL (ref 4.5–12.0)

## 2024-02-21 ENCOUNTER — Other Ambulatory Visit: Payer: Self-pay

## 2024-02-24 ENCOUNTER — Inpatient Hospital Stay

## 2024-02-24 ENCOUNTER — Telehealth: Payer: Self-pay | Admitting: Obstetrics and Gynecology

## 2024-02-24 NOTE — Telephone Encounter (Signed)
 Dr.Secord is headed into surgery and Tinnie Dawn is out due to family emergency. Pt appt need to be r/s.  I called and spoke with pt sister and confirmed the r/s appt. Pt sister stated she will call the facility where pt stays and let them know.

## 2024-03-05 NOTE — Progress Notes (Signed)
   Discharge Plan Member has been discharged from [facility]. Please see below Discharge Plan for details. The Member has been provided a copy of their Discharge Plan via the [facility] Care portal. If you have any questions, please contact our Clinical Team at [phone].  Name Valerie Leonard  Date of Birth 1956-02-10  Discharged Reason No longer eligible: Contract with Provider Terminated  Discharging to: Referring Specialist Referring Provider: Fonda Mower NP  Current Psychiatric Medications Unknown (Member shared being prescribe med for Depression but does not remember the name).  Psychiatric Medication Recommendation Transition N/A (no CC med recs made)  Referrals: None  Discharge Summary Care Delivered: Member had two visits with [facility], Member became disengaged after intake and initial follow-up visit. Supported with medication adherence through problem-solving, along with interpersonal support building around grief (loss of mother). Date: Mar 02 2024 Client: Valerie Leonard Gender: Female Age: 68 DOB: 09-05-1955 [email or web address] Provider: Rosaline Pac [address] PM[facility] [address] [place] [address] [place] [address] Current status: Reason for discharge: Member is being transferred back to [facility] LCS[facility] Duffy for ongoing care Somatic Symptoms: weight loss Should referring organization contact? [facility] could benefit from ongoing support.   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 Member is not open to psychiatric medication recommendations however member shared medication was prescribed for Depression today. Assessments: Liridona Mashaw scored zero on the PHQ-9 denies any  symptoms. no Safety Concerns: Valerie Leonard scored four non-minimal on the GAD-7 reporting symptoms of nervousness and worry only about her family Psych Tx/Hx: Valerie Leonard reported that she was prescribe Depression medication today as member shared the nurse visited her today. Member reports her sister will be picking it up from the pharmacy later today. 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 Member was engaged in the session however when asked certain questions member would ask her sister the answer. Member would also repeat I miss my husband he died thirteen yrs ago. Member shared having the best sister and is not sure what she would do without her as member shared her relying on her sister a lot. Member shared living alone in stable housing, but her family and neighbors check in daily. Member shared having a wheelchair and a walker and reports using her walker to get around in the house. Somatic Symptoms: weight loss  [place] Care Provider: Virgil Pac, Behavioral Health Care Manager]

## 2024-03-09 ENCOUNTER — Inpatient Hospital Stay: Attending: Obstetrics and Gynecology | Admitting: Obstetrics and Gynecology

## 2024-03-09 VITALS — BP 111/75 | HR 79 | Temp 96.9°F | Ht 61.0 in | Wt 114.0 lb

## 2024-03-09 DIAGNOSIS — Z5112 Encounter for antineoplastic immunotherapy: Secondary | ICD-10-CM | POA: Insufficient documentation

## 2024-03-09 DIAGNOSIS — C541 Malignant neoplasm of endometrium: Secondary | ICD-10-CM | POA: Diagnosis present

## 2024-03-09 DIAGNOSIS — Z923 Personal history of irradiation: Secondary | ICD-10-CM | POA: Diagnosis not present

## 2024-03-09 NOTE — Progress Notes (Signed)
 Gynecologic Oncology Consult Visit   Referring Provider: Dr. Garnette Mace  Chief Concern: Endometrial cancer  Subjective:  Valerie Leonard is a 68 y.o. female who is seen in consultation from Dr. Melanee for grade 2 endometrial cancer, now s/p palliative radiation for bleeding with severe anemia, and 3 cycles of carbo-paclitaxel -pembrolizumab  with Dr Melanee.   Vaginal bleeding has resolved. She feels stronger. Now at SNF/[facility]. She has cycle 4 planned for end of the week and CT scheduled for 03/25/24.   Treatment Summary:  June 2025- PMB  09/27/23- Pelvic U/s and Biopsy. Pathology: Moderately differentiated endometrioid adenocarcinoma FIGO II 10/13/23- Consult with Dr Mace 11/11/23- Consult with Dr Elby 12/02/23- consult with Dr Melanee 12/10/23- admitted to hospital for fever. Anemia requiring Evaluated by Dr Elby for possible pyometra as source of infection. During pelvic exam, profuse bleeding occurred and packing was placed.  12/17/23- consult with Dr Chrystal/rad-onc while hospitalized 12/18/23-01/01/24- palliative pelvic radiation for bleeding.  01/08/24- D1C1- carbo-taxol -pembro 01/29/24- D1C2 - carbo-taxol -pembro 02/19/24- D1C3- carbo-taxol -pembro  Gynecologic Oncology History:  She initially presented with postmenopausal vaginal bleeding. She experienced significant vaginal bleeding, severe enough to require emergency medical attention.    During her hospital stay, she underwent an ultrasound and received medication that successfully stopped the bleeding but caused xerostomia. She also required a blood transfusion due to a hemoglobin level of 5.8 g/dL, attributed to the significant blood loss. Hemoglobin on discharge: 8.4 (s/p 2 units pRBCs).   Pelvic ultrasound 09/27/2023:  FINDINGS:  Uterus  Measurements: 8.7 cm x 3.4 cm x 4.4 cm = volume: 68 mL. No fibroids or other mass visualized.  Endometrium  Thickness: 3.9 mm. A large amount of blood products are suspected throughout the  endometrial canal.  Right ovary  Measurements: 2.1 cm x 1.2 cm x 1.4 cm = volume: 1.8 mL. Normal appearance/no adnexal mass.  Left ovary  Measurements: 2.1 cm x 1.4 cm x 1.3 cm = volume: 2.0 mL. Normal appearance/no adnexal mass.  Other findings  No abnormal free fluid.  IMPRESSION:  Large amount of blood products blood products suspected throughout the endometrial canal. Correlation with 6-12 week  follow-up pelvic ultrasound is recommended to further exclude the presence of an underlying endometrial lesion.    Endometrial Biopsy: MODERATELY DIFFERENTIATED ENDOMETRIOID ADENOCARCINOMA (FIGO II).   P16, patchy staining   ER, positive  wild-type staining for P53 Mismatch repair  MLH1-Loss of nuclear expression  MSH2-intact nuclear expression  MSH6-intact nuclear expression  PMS2-Loss of nuclear expression  Testing for methylation of MLH1 promoter will be requested MLH1 methylation Testing: POSITIVE Hypermethylation of the MLH1 promoter was detected in the provided specimen. Hypermethylation of the MLH1 promoter and presence of the BRAF.  V600E mutation are characteristic of sporadic cancers.   She had not seen a doctor in several years.  She does drink alcohol approximately 3 beers a day, may have early symptoms of dementia. She utilizes a walker and did have a fall down her front steps previously.  She is at a fall risk.    Her EKG in the ER was concerning for possible prior infarct.  She stated that she did have feelings of shortness of breath during that event but did not have definitive symptoms of a heart attack before.  Her troponin level was normal. She also has left knee pain and a few years ago had a x-ray of the knee that was consistent with arthritis.  Physical therapy was recommended.  She has not had subsequent follow-up.  She  does not have a primary care physician.   We previously discussed alcohol use and in terms of the CAGE questions she answered the concern as  affirmative.  But the other questions were all negative. She has never gone through withdrawal symptoms even when she has not been drinking alcohol for a prolonged period of time.   She has a very supportive family including a sister, [name], and her brother.    11/06/23- CT CHEST, ABDOMEN, AND PELVIS WITH CONTRAST IMPRESSION: 1. Heterogeneous irregular masslike expansion centered in the lower uterine segment compatible with patient's known endometrial carcinoma. 2. Enlarged bilateral iliac side chain and pelvic sidewall lymph nodes, compatible with nodal disease involvement. 3. Scattered pulmonary nodules measuring up to 6 mm, nonspecific but suspicious for metastatic disease. Consider attention on short-term interval follow-up chest CT. 4. Diffuse hepatic steatosis. 5. Cholelithiasis without findings of acute cholecystitis. 6. Colonic stool burden suggestive of constipation. 7.  Aortic Atherosclerosis   Problem List: Patient Active Problem List   Diagnosis Date Noted   Leukocytosis 12/22/2023   Fever 12/17/2023   SIRS (systemic inflammatory response syndrome) (HCC) 12/16/2023   Postmenopausal bleeding 12/16/2023   Protein-calorie malnutrition, severe 12/16/2023   Endometrial carcinoma (HCC) 12/14/2023   HFrEF (heart failure with reduced ejection fraction) (HCC) 12/03/2023   Endometrial cancer (HCC) 11/11/2023   Anemia 11/04/2023   ABLA (acute blood loss anemia) 09/28/2023   Pressure injury of skin 09/28/2023   Vaginal bleeding 09/27/2023   Severe major depression, single episode, without psychotic features (HCC) 07/26/2020   Depression    Acute delirium 07/25/2020   Acute metabolic encephalopathy    Hypomagnesemia    Chronic alcohol abuse    Hypokalemia 07/24/2020    Past Medical History: Past Medical History:  Diagnosis Date   Acute delirium    Acute metabolic encephalopathy    Anemia    blood loss anemia   Chronic alcohol abuse    Depression    Endometrial  cancer (HCC)    Hypokalemia    Hypomagnesemia    Pressure injury of skin    Severe major depression, single episode, without psychotic features (HCC)    Vaginal bleeding     Past Surgical History: No prior surgeries Past Surgical History:  Procedure Laterality Date   IR IMAGING GUIDED PORT INSERTION  12/17/2023   IR PATIENT EVAL TECH 0-60 MINS  12/10/2023   NO PAST SURGERIES      Past Gynecologic History:  Last Menstrual Period: 54 History of Abnormal pap: no,  Last pap: unknown Sexually active: no  OB History:  OB History  No obstetric history on file.    Family History: History reviewed. No pertinent family history.  Social History: Social History   Socioeconomic History   Marital status: Single    Spouse name: Not on file   Number of children: 0   Years of education: Not on file   Highest education level: Not on file  Occupational History   Not on file  Tobacco Use   Smoking status: Former    Types: Cigarettes, Cigars   Smokeless tobacco: Never  Vaping Use   Vaping status: Never Used  Substance and Sexual Activity   Alcohol use: Yes    Alcohol/week: 3.0 standard drinks of alcohol    Types: 3 Cans of beer per week    Comment: More than 4 glasses of wine, mixed drinks, beer daily.    Drug use: Not Currently    Comment: former use of marijuana  Sexual activity: Not Currently    Birth control/protection: None  Other Topics Concern   Not on file  Social History Narrative   Lives by herself    Social Drivers of Health   Financial Resource Strain: Low Risk  (10/13/2023)   Received from [facility]   Overall Financial Resource Strain (CARDIA)    Difficulty of Paying Living Expenses: Not very hard  Food Insecurity: No Food Insecurity (12/10/2023)   Hunger Vital Sign    Worried About Running Out of Food in the Last Year: Never true    Ran Out of Food in the Last Year: Never true  Recent Concern: Food Insecurity - Food Insecurity Present  (09/27/2023)   Hunger Vital Sign    Worried About Running Out of Food in the Last Year: Sometimes true    Ran Out of Food in the Last Year: Sometimes true  Transportation Needs: No Transportation Needs (12/10/2023)   PRAPARE - Administrator, Civil Service (Medical): No    Lack of Transportation (Non-Medical): No  Recent Concern: Transportation Needs - Unmet Transportation Needs (09/27/2023)   PRAPARE - Administrator, Civil Service (Medical): Yes    Lack of Transportation (Non-Medical): Yes  Physical Activity: Not on file  Stress: Not on file  Social Connections: Unknown (12/11/2023)   Social Connection and Isolation Panel    Frequency of Communication with Friends and Family: More than three times a week    Frequency of Social Gatherings with Friends and Family: More than three times a week    Attends Religious Services: Patient declined    Database Administrator or Organizations: Patient declined    Attends Banker Meetings: Patient declined    Marital Status: Widowed  Recent Concern: Social Connections - Socially Isolated (09/28/2023)   Social Connection and Isolation Panel    Frequency of Communication with Friends and Family: Never    Frequency of Social Gatherings with Friends and Family: Never    Attends Religious Services: Never    Database Administrator or Organizations: No    Attends Banker Meetings: Never    Marital Status: Widowed  Intimate Partner Violence: Not At Risk (12/10/2023)   Humiliation, Afraid, Rape, and Kick questionnaire    Fear of Current or Ex-Partner: No    Emotionally Abused: No    Physically Abused: No    Sexually Abused: No    Allergies: No Known Allergies  Current Medications: Current Outpatient Medications  Medication Sig Dispense Refill   ALPRAZolam  (XANAX ) 0.25 MG tablet Take 1 tablet (0.25 mg total) by mouth 2 (two) times daily as needed for anxiety. 10 tablet 0   amoxicillin -clavulanate  (AUGMENTIN ) 500-125 MG tablet Take 1 tablet by mouth 2 (two) times daily.     [Paused] dexamethasone  (DECADRON ) 4 MG tablet Take 2 tablets (8mg ) by mouth daily starting the day after carboplatin  for 3 days. Take with food (Patient not taking: Reported on 02/19/2024) 30 tablet 1   diphenhydrAMINE  (BENADRYL ) 50 MG capsule Take 1 capsule (50 mg total) by mouth at bedtime as needed for itching, allergies or sleep.     escitalopram  (LEXAPRO ) 5 MG tablet Take 1 tablet (5 mg total) by mouth daily. 30 tablet 0   feeding supplement (ENSURE PLUS HIGH PROTEIN) LIQD Take 237 mLs by mouth 3 (three) times daily between meals. (Patient not taking: Reported on 02/19/2024)     ferrous sulfate  325 (65 FE) MG tablet Take 1  tablet (325 mg total) by mouth daily with breakfast.     folic acid  (FOLVITE ) 1 MG tablet Take 1 tablet (1 mg total) by mouth daily. 30 tablet 0   lidocaine -prilocaine  (EMLA ) cream Apply to affected area once (Patient not taking: Reported on 01/29/2024) 30 g 3   Multiple Vitamin (MULTIVITAMIN WITH MINERALS) TABS tablet Take 1 tablet by mouth daily. 30 tablet 1   ondansetron  (ZOFRAN ) 8 MG tablet Take 1 tablet (8 mg total) by mouth every 8 (eight) hours as needed for nausea or vomiting. Start on the third day after carboplatin . 30 tablet 1   oxyCODONE  (OXY IR/ROXICODONE ) 5 MG immediate release tablet Take 1 tablet (5 mg total) by mouth every 4 (four) hours as needed for severe pain (pain score 7-10). (Patient not taking: Reported on 02/19/2024) 60 tablet 0   prochlorperazine  (COMPAZINE ) 10 MG tablet Take 1 tablet (10 mg total) by mouth every 6 (six) hours as needed for nausea or vomiting. (Patient not taking: Reported on 02/19/2024) 30 tablet 1   traZODone  (DESYREL ) 50 MG tablet Take 1 tablet (50 mg total) by mouth at bedtime. 30 tablet 0   No current facility-administered medications for this visit.   Review of Systems General:  fatigue Skin: no complaints Eyes: no complaints HEENT: no  complaints Breasts: no complaints Pulmonary: shortness of breath with exertion  Cardiac: no complaints Gastrointestinal: decreased appetite Genitourinary/Sexual: no complaints Ob/Gyn: vaginal spotting has resolved Musculoskeletal: no complaints Hematology: no complaints Neurologic/Psych: no complaints   Objective:  Physical Examination:  BP 111/75 (BP Location: Left Arm, Patient Position: Sitting)   Pulse 79   Temp (!) 96.9 F (36.1 C) (Tympanic)   Ht 5' 1 (1.549 m)   Wt 114 lb (51.7 kg)   SpO2 100%   BMI 21.54 kg/m    ECOG Performance Status: 2 - Symptomatic, <50% confined to bed  GENERAL: Elderly appearing female in no acute distress; concern for memory related issues HEENT:  PERRL. Thyroid  without masses.  NODES:  No cervical, supraclavicular, axillary, or inguinal lymphadenopathy palpated.  LUNGS:  Clear to auscultation bilaterally.  No wheezes or rhonchi. HEART:  Regular rate and rhythm. ABDOMEN:  Soft, nontender.  Nondistended. No ascites. No masses. EXTREMITIES:  No peripheral edema.   SKIN:  Clear with no obvious rashes or skin changes.  NEURO:  Nonfocal. Well oriented.  Appropriate affect.  Pelvic: Exam chaperoned by NP and CMA- pt requires 2 person assistance d/t hip rigidity Vulva: normal appearing vulva with no masses, tenderness or lesions;  Vagina: normal vagina, no discharge, exudate, lesion, or erythema;  Adnexa: normal adnexa in size, nontender and no masses;  Uterus: uterus is normal size, shape, consistency and nontender;  Cervix: no cervical motion tenderness and no lesions;  Rectal: not indicated  Lab Review No labs on site today   Radiologic Imaging:  Per HPI     Assessment:  Valerie Leonard is a 68 y.o. female diagnosed with grade 2 endometrial cancer, dMMR, p53wt, ER+ with radiologic imaging concerning for stage III C1 disease with grossly enlarged pelvic nodes on CT and findings of pulmonary nodules are not definitive, s/p 3 cycles of  carboplatin -paclitaxel -pembrolizumab , who returns to clinic for follow up. Vaginal bleeding has now resolved. Symptomatically improved. Tolerating chemotherapy well without significant side effects. On exam she appears to have had a good response to treatment.  Anemia - PMB presented with hemoglobin 5.8. Received several blood transfusions. Source of bleeding was vaginal bleeding from endometrial cancer. She hemorrhaged in  clinic and required vaginal packing followed by palliative radiation while hospitalized, completed 01/01/24. Hbg now 11.4.   Medical co-morbidities complicating care: Hypoalbuminemia, History of alcohol abuse with metabolic encephalopathy, Possible prior MI based on EKG, Leukocytosis uncertain etiology, Frailty- resides at [facility]; uses walker for ambulation.  Plan:   Problem List Items Addressed This Visit       Genitourinary   Endometrial cancer (HCC) - Primary    She appears to have had a good response to treatment and vaginal bleeding has now resolved. No visible tumor on pelvic exam today. Plan to continue chemo-IO and will get CT to assess response after next cycle. If improvement confirmed on imaging then would plan for a total of 6 cycles followed by pembrolizumab  maintenance.  Can also consider completion hytserectomy.    The patient's diagnosis, an outline of the further diagnostic and laboratory studies which will be required, the recommendation, and alternatives were discussed.  All questions were answered to the patient's satisfaction.  Tinnie Dawn, DNP, AGNP-C, AOCNP [facility] [phone] (clinic)  I personally had a face to face interaction and evaluated the patient jointly with the NP, Ms. Tinnie Dawn.  I have reviewed her history and available records and have performed the key portions of the physical exam including lymph node survey, abdominal exam, pelvic exam with my findings confirming those documented above by the APP.  I have  discussed the case with the APP and the patient.  I agree with the above documentation, assessment and plan which was fully formulated by me.  Counseling was completed by me.   I personally saw the patient and performed a substantive portion of this encounter in conjunction with the listed APP as documented above.  Prentice Agent, MD  CC:  Garnette JONETTA Mace,  MD [address] [place],  [place] [address] [phone]

## 2024-03-10 ENCOUNTER — Other Ambulatory Visit: Payer: Self-pay

## 2024-03-11 ENCOUNTER — Encounter: Payer: Self-pay | Admitting: Oncology

## 2024-03-11 ENCOUNTER — Inpatient Hospital Stay

## 2024-03-11 ENCOUNTER — Inpatient Hospital Stay: Admitting: Oncology

## 2024-03-11 VITALS — BP 108/72 | HR 98 | Temp 98.6°F | Resp 19 | Wt 115.2 lb

## 2024-03-11 VITALS — BP 120/65 | HR 94 | Temp 97.0°F | Resp 18

## 2024-03-11 DIAGNOSIS — C541 Malignant neoplasm of endometrium: Secondary | ICD-10-CM

## 2024-03-11 DIAGNOSIS — Z5112 Encounter for antineoplastic immunotherapy: Secondary | ICD-10-CM

## 2024-03-11 DIAGNOSIS — Z5111 Encounter for antineoplastic chemotherapy: Secondary | ICD-10-CM

## 2024-03-11 LAB — CMP (CANCER CENTER ONLY)
ALT: 9 U/L (ref 0–44)
AST: 21 U/L (ref 15–41)
Albumin: 3.8 g/dL (ref 3.5–5.0)
Alkaline Phosphatase: 100 U/L (ref 38–126)
Anion gap: 9 (ref 5–15)
BUN: 13 mg/dL (ref 8–23)
CO2: 28 mmol/L (ref 22–32)
Calcium: 9.4 mg/dL (ref 8.9–10.3)
Chloride: 104 mmol/L (ref 98–111)
Creatinine: 0.44 mg/dL (ref 0.44–1.00)
GFR, Estimated: >60 mL/min (ref >60)
Glucose, Bld: 102 mg/dL — ABNORMAL HIGH (ref 70–99)
Potassium: 3.8 mmol/L (ref 3.5–5.1)
Sodium: 140 mmol/L (ref 135–145)
Total Bilirubin: 0.2 mg/dL (ref 0.0–1.2)
Total Protein: 7.2 g/dL (ref 6.5–8.1)

## 2024-03-11 LAB — CBC WITH DIFFERENTIAL (CANCER CENTER ONLY)
Abs Immature Granulocytes: 0.04 K/uL (ref 0.00–0.07)
Basophils Absolute: 0 K/uL (ref 0.0–0.1)
Basophils Relative: 1 %
Eosinophils Absolute: 0 K/uL (ref 0.0–0.5)
Eosinophils Relative: 0 %
HCT: 35.4 % — ABNORMAL LOW (ref 36.0–46.0)
Hemoglobin: 11.5 g/dL — ABNORMAL LOW (ref 12.0–15.0)
Immature Granulocytes: 1 %
Lymphocytes Relative: 34 %
Lymphs Abs: 1.9 K/uL (ref 0.7–4.0)
MCH: 31.3 pg (ref 26.0–34.0)
MCHC: 32.5 g/dL (ref 30.0–36.0)
MCV: 96.2 fL (ref 80.0–100.0)
Monocytes Absolute: 0.5 K/uL (ref 0.1–1.0)
Monocytes Relative: 9 %
Neutro Abs: 3.1 K/uL (ref 1.7–7.7)
Neutrophils Relative %: 55 %
Platelet Count: 239 K/uL (ref 150–400)
RBC: 3.68 MIL/uL — ABNORMAL LOW (ref 3.87–5.11)
RDW: 14 % (ref 11.5–15.5)
WBC Count: 5.6 K/uL (ref 4.0–10.5)
nRBC: 0 % (ref 0.0–0.2)

## 2024-03-11 LAB — FERRITIN: Ferritin: 828 ng/mL — ABNORMAL HIGH (ref 11–307)

## 2024-03-11 LAB — IRON AND TIBC
Iron: 57 ug/dL (ref 28–170)
Saturation Ratios: 23 % (ref 10.4–31.8)
TIBC: 248 ug/dL — ABNORMAL LOW (ref 250–450)
UIBC: 191 ug/dL

## 2024-03-11 LAB — VITAMIN B12: Vitamin B-12: 377 pg/mL (ref 180–914)

## 2024-03-11 MED ORDER — SODIUM CHLORIDE 0.9 % IV SOLN
INTRAVENOUS | Status: DC
  Filled 2024-03-11: qty 250

## 2024-03-11 MED ORDER — SODIUM CHLORIDE 0.9 % IV SOLN
135.0000 mg/m2 | Freq: Once | INTRAVENOUS | Status: AC
  Administered 2024-03-11: 198 mg via INTRAVENOUS
  Filled 2024-03-11: qty 33

## 2024-03-11 MED ORDER — DEXAMETHASONE SOD PHOSPHATE PF 10 MG/ML IJ SOLN
10.0000 mg | Freq: Once | INTRAMUSCULAR | Status: AC
  Administered 2024-03-11: 10 mg via INTRAVENOUS

## 2024-03-11 MED ORDER — PALONOSETRON HCL INJECTION 0.25 MG/5ML
0.2500 mg | Freq: Once | INTRAVENOUS | Status: AC
  Administered 2024-03-11: 0.25 mg via INTRAVENOUS
  Filled 2024-03-11: qty 5

## 2024-03-11 MED ORDER — APREPITANT 130 MG/18ML IV EMUL
130.0000 mg | Freq: Once | INTRAVENOUS | Status: AC
  Administered 2024-03-11: 130 mg via INTRAVENOUS
  Filled 2024-03-11: qty 18

## 2024-03-11 MED ORDER — DIPHENHYDRAMINE HCL 50 MG/ML IJ SOLN
25.0000 mg | Freq: Once | INTRAMUSCULAR | Status: AC
  Administered 2024-03-11: 25 mg via INTRAVENOUS
  Filled 2024-03-11: qty 1

## 2024-03-11 MED ORDER — SODIUM CHLORIDE 0.9 % IV SOLN
200.0000 mg | Freq: Once | INTRAVENOUS | Status: AC
  Administered 2024-03-11: 200 mg via INTRAVENOUS
  Filled 2024-03-11: qty 8

## 2024-03-11 MED ORDER — SODIUM CHLORIDE 0.9 % IV SOLN
336.5000 mg | Freq: Once | INTRAVENOUS | Status: AC
  Administered 2024-03-11: 340 mg via INTRAVENOUS
  Filled 2024-03-11: qty 34

## 2024-03-11 MED ORDER — FAMOTIDINE IN NACL 20-0.9 MG/50ML-% IV SOLN
20.0000 mg | Freq: Once | INTRAVENOUS | Status: AC
  Administered 2024-03-11: 20 mg via INTRAVENOUS
  Filled 2024-03-11: qty 50

## 2024-03-11 NOTE — Patient Instructions (Signed)

## 2024-03-11 NOTE — Progress Notes (Signed)
 Patient states she has had a cough for the last week.

## 2024-03-11 NOTE — Progress Notes (Signed)
 I connected with Valerie Leonard on 03/11/24 at  9:00 AM EST by video enabled telemedicine visit and verified that I am speaking with the correct person using two identifiers.   I discussed the limitations, risks, security and privacy concerns of performing an evaluation and management service by telemedicine and the availability of in-person appointments. I also discussed with the patient that there may be a patient responsible charge related to this service. The patient expressed understanding and agreed to proceed.  Other persons participating in the visit and their role in the encounter:  patients sister  Patient's location:  cancer center Provider's location:  home  Chief Complaint: On treatment assessment prior to cycle 4 of CarboTaxol Keytruda  chemotherapy  History of present illness: Patient is a 68 year old female who presented with post menopausal bleeding in June 2025.  Her hemoglobin had dropped down to 5.8 requiring PRBC transfusion.   Pelvic ultrasound in June 2025 showed large amount of blood products throughout the endometrial panel.  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  Wild-type staining for p53.  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  Enlarged bilateral iliac sided chain and pelvic sidewall lymph nodes compatible with nodal disease involvement.  Scattered pulmonary nodules measuring up to 6 mm nonspecific but suspicious for metastatic disease.   Patient completed a course of palliative radiation to her vagina.  She is presently on CarboTaxol Keytruda  chemotherapy    Interval  history overall she is tolerating treatments well without any significant side effects.  Denies any nausea vomiting diarrhea or tingling numbness in her hands and feet.  [facility] is still at [facility] and working with physical therapy to gain her strength back.  Denies any vaginal bleeding   Review of Systems  Constitutional:  Negative for chills, fever, malaise/fatigue and weight loss.  HENT:  Negative for congestion, ear discharge and nosebleeds.   Eyes:  Negative for blurred vision.  Respiratory:  Negative for cough, hemoptysis, sputum production, shortness of breath and wheezing.   Cardiovascular:  Negative for chest pain, palpitations, orthopnea and claudication.  Gastrointestinal:  Negative for abdominal pain, blood in stool, constipation, diarrhea, heartburn, melena, nausea and vomiting.  Genitourinary:  Negative for dysuria, flank pain, frequency, hematuria and urgency.  Musculoskeletal:  Negative for back pain, joint pain and myalgias.  Skin:  Negative for rash.  Neurological:  Negative for dizziness, tingling, focal weakness, seizures, weakness and headaches.  Endo/Heme/Allergies:  Does not bruise/bleed easily.  Psychiatric/Behavioral:  Negative for depression and suicidal ideas. The patient does not have insomnia.     No Known Allergies  Past Medical History:  Diagnosis Date   Acute delirium    Acute metabolic encephalopathy    Anemia    blood loss anemia   Chronic alcohol abuse    Depression    Endometrial cancer (HCC)    Hypokalemia    Hypomagnesemia    Pressure injury of skin    Severe major depression, single episode, without psychotic features (HCC)    Vaginal bleeding     Past Surgical History:  Procedure Laterality Date   IR IMAGING GUIDED PORT INSERTION  12/17/2023   IR PATIENT EVAL TECH 0-60 MINS  12/10/2023   NO PAST SURGERIES      Social History   Socioeconomic History   Marital status: Single    Spouse name: Not on file   Number of children: 0    Years of education: Not on file   Highest education level: Not on file  Occupational History   Not on file  Tobacco Use   Smoking status: Former    Types: Cigarettes, Cigars   Smokeless tobacco: Never  Vaping Use   Vaping status: Never Used  Substance and Sexual Activity   Alcohol use: Yes    Alcohol/week: 3.0 standard drinks of alcohol    Types: 3 Cans of beer per week    Comment: More than 4 glasses of wine, mixed drinks, beer daily.    Drug use: Not Currently    Comment: former use of marijuana   Sexual activity: Not Currently    Birth control/protection: None  Other Topics Concern   Not on file  Social History Narrative   Lives by herself    Social Drivers of Health   Financial Resource Strain: Low Risk  (10/13/2023)   Received from [facility]   Overall Financial Resource Strain (CARDIA)    Difficulty of Paying Living Expenses: Not very hard  Food Insecurity: No Food Insecurity (12/10/2023)   Hunger Vital Sign    Worried About Running Out of Food in the Last Year: Never true    Ran Out of Food in the Last Year: Never true  Recent Concern: Food Insecurity - Food Insecurity Present (09/27/2023)   Hunger Vital Sign    Worried About Running Out of Food in the Last Year: Sometimes true    Ran Out of Food in the Last Year: Sometimes true  Transportation Needs: No Transportation Needs (12/10/2023)   PRAPARE - Administrator, Civil Service (Medical): No    Lack of Transportation (Non-Medical): No  Recent Concern: Transportation Needs - Unmet Transportation Needs (09/27/2023)   PRAPARE - Administrator, Civil Service (Medical): Yes    Lack of Transportation (Non-Medical): Yes  Physical Activity: Not on file  Stress: Not on file  Social Connections: Unknown (12/11/2023)   Social Connection and Isolation Panel    Frequency of Communication with Friends and Family: More than three times a week    Frequency of Social Gatherings with Friends and  Family: More than three times a week    Attends Religious Services: Patient declined    Database Administrator or Organizations: Patient declined    Attends Banker Meetings: Patient declined    Marital Status: Widowed  Recent Concern: Social Connections - Socially Isolated (09/28/2023)   Social Connection and Isolation Panel    Frequency of Communication with Friends and Family: Never    Frequency of Social Gatherings with Friends and Family: Never    Attends Religious Services: Never    Database Administrator or Organizations: No    Attends Banker Meetings: Never    Marital Status: Widowed  Intimate Partner Violence: Not At Risk (12/10/2023)   Humiliation, Afraid, Rape, and Kick questionnaire    Fear of Current or Ex-Partner: No    Emotionally Abused: No    Physically Abused: No    Sexually Abused: No    History reviewed. No pertinent family history.   Current Outpatient Medications:    ALPRAZolam  (XANAX ) 0.25 MG tablet, Take 1 tablet (0.25 mg total) by mouth 2 (two) times  daily as needed for anxiety., Disp: 10 tablet, Rfl: 0   amoxicillin -clavulanate (AUGMENTIN ) 500-125 MG tablet, Take 1 tablet by mouth 2 (two) times daily., Disp: , Rfl:    diphenhydrAMINE  (BENADRYL ) 50 MG capsule, Take 1 capsule (50 mg total) by mouth at bedtime as needed for itching, allergies or sleep., Disp: , Rfl:    escitalopram  (LEXAPRO ) 5 MG tablet, Take 1 tablet (5 mg total) by mouth daily., Disp: 30 tablet, Rfl: 0   ferrous sulfate  325 (65 FE) MG tablet, Take 1 tablet (325 mg total) by mouth daily with breakfast., Disp: , Rfl:    folic acid  (FOLVITE ) 1 MG tablet, Take 1 tablet (1 mg total) by mouth daily., Disp: 30 tablet, Rfl: 0   Multiple Vitamin (MULTIVITAMIN WITH MINERALS) TABS tablet, Take 1 tablet by mouth daily., Disp: 30 tablet, Rfl: 1   ondansetron  (ZOFRAN ) 8 MG tablet, Take 1 tablet (8 mg total) by mouth every 8 (eight) hours as needed for nausea or vomiting. Start on the  third day after carboplatin ., Disp: 30 tablet, Rfl: 1   traZODone  (DESYREL ) 50 MG tablet, Take 1 tablet (50 mg total) by mouth at bedtime., Disp: 30 tablet, Rfl: 0   [Paused] dexamethasone  (DECADRON ) 4 MG tablet, Take 2 tablets (8mg ) by mouth daily starting the day after carboplatin  for 3 days. Take with food (Patient not taking: Reported on 03/11/2024), Disp: 30 tablet, Rfl: 1   feeding supplement (ENSURE PLUS HIGH PROTEIN) LIQD, Take 237 mLs by mouth 3 (three) times daily between meals. (Patient not taking: Reported on 03/11/2024), Disp: , Rfl:    lidocaine -prilocaine  (EMLA ) cream, Apply to affected area once (Patient not taking: Reported on 03/11/2024), Disp: 30 g, Rfl: 3   oxyCODONE  (OXY IR/ROXICODONE ) 5 MG immediate release tablet, Take 1 tablet (5 mg total) by mouth every 4 (four) hours as needed for severe pain (pain score 7-10). (Patient not taking: Reported on 03/11/2024), Disp: 60 tablet, Rfl: 0   prochlorperazine  (COMPAZINE ) 10 MG tablet, Take 1 tablet (10 mg total) by mouth every 6 (six) hours as needed for nausea or vomiting. (Patient not taking: Reported on 03/11/2024), Disp: 30 tablet, Rfl: 1 No current facility-administered medications for this visit.  Facility-Administered Medications Ordered in Other Visits:    0.9 %  sodium chloride  infusion, , Intravenous, Continuous, Melanee Annah BROCKS, MD, Last Rate: 10 mL/hr at 03/11/24 0953, New Bag at 03/11/24 0953   CARBOplatin  (PARAPLATIN ) 340 mg in sodium chloride  0.9 % 100 mL chemo infusion, 340 mg, Intravenous, Once, Melanee Annah BROCKS, MD   PACLitaxel  (TAXOL ) 198 mg in sodium chloride  0.9 % 250 mL chemo infusion (> 80mg /m2), 135 mg/m2 (Treatment Plan Recorded), Intravenous, Once, Melanee Annah BROCKS, MD, Last Rate: 94 mL/hr at 03/11/24 1142, 198 mg at 03/11/24 1142  No results found.  No images are attached to the encounter.      Latest Ref Rng & Units 03/11/2024    8:49 AM  CMP  Glucose 70 - 99 mg/dL 897   BUN 8 - 23 mg/dL 13   Creatinine 9.55 -  1.00 mg/dL 9.55   Sodium 864 - 854 mmol/L 140   Potassium 3.5 - 5.1 mmol/L 3.8   Chloride 98 - 111 mmol/L 104   CO2 22 - 32 mmol/L 28   Calcium  8.9 - 10.3 mg/dL 9.4   Total Protein 6.5 - 8.1 g/dL 7.2   Total Bilirubin 0.0 - 1.2 mg/dL 0.2   Alkaline Phos 38 - 126 U/L 100   AST 15 -  41 U/L 21   ALT 0 - 44 U/L 9       Latest Ref Rng & Units 03/11/2024    8:49 AM  CBC  WBC 4.0 - 10.5 K/uL 5.6   Hemoglobin 12.0 - 15.0 g/dL 88.4   Hematocrit 63.9 - 46.0 % 35.4   Platelets 150 - 400 K/uL 239      Observation/objective: Appears in no acute distress over video visit today.  Breathing is nonlabored  Assessment and plan: Patient is a 68 year old female with history of stage IIIc endometrioid endometrial carcinoma here for on treatment assessment prior to cycle 4 of CarboTaxol Keytruda  chemotherapy  Counts okay to proceed with cycle 4 of CarboTaxol Keytruda  chemotherapy today.  She has upcoming scans in 2 weeks.  I will see her back in 3 weeks for cycle 5.  Plan is to complete 6 cycles followed by consideration for Keytruda  maintenance.  Patient does have sporadic PMS2 loss of nuclear expression and therefore is likely to respond well to continued immunotherapy.  Follow-up instructions: As above  I discussed the assessment and treatment plan with the patient. The patient was provided an opportunity to ask questions and all were answered. The patient agreed with the plan and demonstrated an understanding of the instructions.   The patient was advised to call back or seek an in-person evaluation if the symptoms worsen or if the condition fails to improve as anticipated.  I provided 25 minutes of face-to-face video visit time during this encounter, and > 50% was spent counseling as documented under my assessment & plan.  Visit Diagnosis: 1. Endometrial cancer [facility])     Dr. Annah Skene, MD, MPH [facility] at [facility] Tel- [phone] 03/11/2024 1:05 PM

## 2024-03-12 LAB — CA 125: Cancer Antigen (CA) 125: 28.3 U/mL (ref 0.0–38.1)

## 2024-03-16 ENCOUNTER — Encounter: Payer: Self-pay | Admitting: Radiation Oncology

## 2024-03-16 ENCOUNTER — Ambulatory Visit
Admission: RE | Admit: 2024-03-16 | Discharge: 2024-03-16 | Disposition: A | Discharge status: Home / Self Care | Source: Ambulatory Visit | Attending: Radiation Oncology | Admitting: Radiation Oncology

## 2024-03-16 VITALS — BP 104/69 | HR 115 | Temp 98.0°F | Resp 20

## 2024-03-16 DIAGNOSIS — C541 Malignant neoplasm of endometrium: Secondary | ICD-10-CM

## 2024-03-16 NOTE — Progress Notes (Signed)
 Radiation Oncology Follow up Note  Name: Valerie Leonard   Date:   03/16/2024 MRN:  [id number] DOB: Aug 05, 1955    This 68 y.o. female presents to the clinic today for 1 month follow-up status post palliative radiation therapy for stage IIIc endometrioid endometrial carcinoma with persistent bleeding.  REFERRING PROVIDER: No ref. provider found  HPI: Patient is a 68 year old female now out 1 month having completed palliative radiation therapy to her uterus for persistent bleeding associated with stage IIIc endometrioid endometrial carcinoma.  Seen today in routine follow-up she is doing well.  She is having no significant increased lower urinary tract symptoms diarrhea.  Her bleeding has completely abated..  She is currently on cycle 4 of CarboTaxol Keytruda  which she is tolerating well.  COMPLICATIONS OF TREATMENT: none  FOLLOW UP COMPLIANCE: keeps appointments   PHYSICAL EXAM:  BP 104/69   Pulse (!) 115   Temp 98 F (36.7 C) (Tympanic)   Resp 20  Wheelchair-bound female in NAD.  Well-developed well-nourished patient in NAD. HEENT reveals PERLA, EOMI, discs not visualized.  Oral cavity is clear. No oral mucosal lesions are identified. Neck is clear without evidence of cervical or supraclavicular adenopathy. Lungs are clear to A&P. Cardiac examination is essentially unremarkable with regular rate and rhythm without murmur rub or thrill. Abdomen is benign with no organomegaly or masses noted. Motor sensory and DTR levels are equal and symmetric in the upper and lower extremities. Cranial nerves II through XII are grossly intact. Proprioception is intact. No peripheral adenopathy or edema is identified. No motor or sensory levels are noted. Crude visual fields are within normal range.  RADIOLOGY RESULTS: No current films to review  PLAN: Present time patient is doing well complete abatement of her vaginal bleeding.  Currently tolerating chemotherapy and immunotherapy well.  I have asked to see  her back in 6 months just to keep tabs on her.  Will be happy to reevaluate the patient in time should that be indicated.  I would like to take this opportunity to thank you for allowing me to participate in the care of your patient.SABRA Marcey Penton, MD

## 2024-03-17 ENCOUNTER — Telehealth: Payer: Self-pay | Admitting: Oncology

## 2024-03-17 NOTE — Telephone Encounter (Signed)
 Called pt to confirm CT appt for 12/19 - no answer and no vm set up - will try calling again tomorrow (12/12) - LH

## 2024-03-18 ENCOUNTER — Other Ambulatory Visit: Payer: Self-pay

## 2024-03-22 ENCOUNTER — Telehealth: Payer: Self-pay

## 2024-03-22 NOTE — Telephone Encounter (Signed)
 [facility] attempted contacting patient after [facility] discharge.  Called her home phone and cell phone and there was no answer.  Sent her a message via MyChart and asked for a call back.

## 2024-03-25 ENCOUNTER — Ambulatory Visit
Admission: RE | Admit: 2024-03-25 | Discharge: 2024-03-25 | Disposition: A | Discharge status: Home / Self Care | Source: Ambulatory Visit | Attending: Oncology | Admitting: Oncology

## 2024-03-25 DIAGNOSIS — C541 Malignant neoplasm of endometrium: Secondary | ICD-10-CM | POA: Insufficient documentation

## 2024-03-25 MED ORDER — IOHEXOL 300 MG/ML  SOLN
100.0000 mL | Freq: Once | INTRAMUSCULAR | Status: AC | PRN
  Administered 2024-03-25: 100 mL via INTRAVENOUS

## 2024-03-25 MED ORDER — IOHEXOL 9 MG/ML PO SOLN: 500.0000 mL | ORAL | Status: AC

## 2024-04-04 ENCOUNTER — Inpatient Hospital Stay: Admitting: Oncology

## 2024-04-04 ENCOUNTER — Inpatient Hospital Stay

## 2024-04-04 ENCOUNTER — Encounter: Payer: Self-pay | Admitting: Oncology

## 2024-04-04 VITALS — BP 94/57 | HR 82 | Resp 18

## 2024-04-04 VITALS — BP 96/62 | HR 94 | Temp 96.7°F | Ht 61.0 in | Wt 120.0 lb

## 2024-04-04 DIAGNOSIS — Z5112 Encounter for antineoplastic immunotherapy: Secondary | ICD-10-CM | POA: Diagnosis not present

## 2024-04-04 DIAGNOSIS — C541 Malignant neoplasm of endometrium: Secondary | ICD-10-CM

## 2024-04-04 DIAGNOSIS — Z5111 Encounter for antineoplastic chemotherapy: Secondary | ICD-10-CM

## 2024-04-04 LAB — CBC WITH DIFFERENTIAL (CANCER CENTER ONLY)
Abs Immature Granulocytes: 0.06 K/uL (ref 0.00–0.07)
Basophils Absolute: 0 K/uL (ref 0.0–0.1)
Basophils Relative: 1 %
Eosinophils Absolute: 0.1 K/uL (ref 0.0–0.5)
Eosinophils Relative: 1 %
HCT: 34 % — ABNORMAL LOW (ref 36.0–46.0)
Hemoglobin: 10.9 g/dL — ABNORMAL LOW (ref 12.0–15.0)
Immature Granulocytes: 1 %
Lymphocytes Relative: 35 %
Lymphs Abs: 1.6 K/uL (ref 0.7–4.0)
MCH: 30.9 pg (ref 26.0–34.0)
MCHC: 32.1 g/dL (ref 30.0–36.0)
MCV: 96.3 fL (ref 80.0–100.0)
Monocytes Absolute: 0.5 K/uL (ref 0.1–1.0)
Monocytes Relative: 11 %
Neutro Abs: 2.2 K/uL (ref 1.7–7.7)
Neutrophils Relative %: 51 %
Platelet Count: 192 K/uL (ref 150–400)
RBC: 3.53 MIL/uL — ABNORMAL LOW (ref 3.87–5.11)
RDW: 13.5 % (ref 11.5–15.5)
WBC Count: 4.4 K/uL (ref 4.0–10.5)
nRBC: 0 % (ref 0.0–0.2)

## 2024-04-04 LAB — CMP (CANCER CENTER ONLY)
ALT: 11 U/L (ref 0–44)
AST: 20 U/L (ref 15–41)
Albumin: 3.8 g/dL (ref 3.5–5.0)
Alkaline Phosphatase: 109 U/L (ref 38–126)
Anion gap: 9 (ref 5–15)
BUN: 10 mg/dL (ref 8–23)
CO2: 28 mmol/L (ref 22–32)
Calcium: 9.3 mg/dL (ref 8.9–10.3)
Chloride: 104 mmol/L (ref 98–111)
Creatinine: 0.5 mg/dL (ref 0.44–1.00)
GFR, Estimated: >60 mL/min
Glucose, Bld: 100 mg/dL — ABNORMAL HIGH (ref 70–99)
Potassium: 4 mmol/L (ref 3.5–5.1)
Sodium: 141 mmol/L (ref 135–145)
Total Bilirubin: <0.2 mg/dL (ref 0.0–1.2)
Total Protein: 6.8 g/dL (ref 6.5–8.1)

## 2024-04-04 MED ORDER — APREPITANT 130 MG/18ML IV EMUL
130.0000 mg | Freq: Once | INTRAVENOUS | Status: AC
  Administered 2024-04-04: 130 mg via INTRAVENOUS
  Filled 2024-04-04: qty 18

## 2024-04-04 MED ORDER — PALONOSETRON HCL INJECTION 0.25 MG/5ML
0.2500 mg | Freq: Once | INTRAVENOUS | Status: AC
  Administered 2024-04-04: 0.25 mg via INTRAVENOUS
  Filled 2024-04-04: qty 5

## 2024-04-04 MED ORDER — DEXAMETHASONE SOD PHOSPHATE PF 10 MG/ML IJ SOLN
10.0000 mg | Freq: Once | INTRAMUSCULAR | Status: AC
  Administered 2024-04-04: 10 mg via INTRAVENOUS

## 2024-04-04 MED ORDER — SODIUM CHLORIDE 0.9 % IV SOLN
340.0000 mg | Freq: Once | INTRAVENOUS | Status: AC
  Administered 2024-04-04: 340 mg via INTRAVENOUS
  Filled 2024-04-04: qty 34

## 2024-04-04 MED ORDER — SODIUM CHLORIDE 0.9 % IV SOLN
INTRAVENOUS | Status: DC
  Filled 2024-04-04 (×2): qty 250

## 2024-04-04 MED ORDER — FAMOTIDINE IN NACL 20-0.9 MG/50ML-% IV SOLN
20.0000 mg | Freq: Once | INTRAVENOUS | Status: AC
  Administered 2024-04-04: 20 mg via INTRAVENOUS
  Filled 2024-04-04: qty 50

## 2024-04-04 MED ORDER — SODIUM CHLORIDE 0.9 % IV SOLN
200.0000 mg | Freq: Once | INTRAVENOUS | Status: AC
  Administered 2024-04-04: 200 mg via INTRAVENOUS
  Filled 2024-04-04: qty 8

## 2024-04-04 MED ORDER — DIPHENHYDRAMINE HCL 50 MG/ML IJ SOLN
25.0000 mg | Freq: Once | INTRAMUSCULAR | Status: AC
  Administered 2024-04-04: 25 mg via INTRAVENOUS
  Filled 2024-04-04: qty 1

## 2024-04-04 MED ORDER — SODIUM CHLORIDE 0.9 % IV SOLN
135.0000 mg/m2 | Freq: Once | INTRAVENOUS | Status: AC
  Administered 2024-04-04: 198 mg via INTRAVENOUS
  Filled 2024-04-04: qty 33

## 2024-04-04 NOTE — Patient Instructions (Signed)
 [facility] - A DEPT OF [facility]  Discharge Instructions: Thank you for choosing [facility] to provide your oncology and hematology care.  If you have a lab appointment with the Cancer Center, please go directly to the Cancer Center and check in at the registration area.  Wear comfortable clothing and clothing appropriate for easy access to any Portacath or PICC line.   We strive to give you quality time with your provider. You may need to reschedule your appointment if you arrive late (15 or more minutes).  Arriving late affects you and other patients whose appointments are after yours.  Also, if you miss three or more appointments without notifying the office, you may be dismissed from the clinic at the provider's discretion.      For prescription refill requests, have your pharmacy contact our office and allow 72 hours for refills to be completed.    Today you received the following chemotherapy and/or immunotherapy agents- keytruda , taxol , carboplatin       To help prevent nausea and vomiting after your treatment, we encourage you to take your nausea medication as directed.  BELOW ARE SYMPTOMS THAT SHOULD BE REPORTED IMMEDIATELY: *FEVER GREATER THAN 100.4 F (38 C) OR HIGHER *CHILLS OR SWEATING *NAUSEA AND VOMITING THAT IS NOT CONTROLLED WITH YOUR NAUSEA MEDICATION *UNUSUAL SHORTNESS OF BREATH *UNUSUAL BRUISING OR BLEEDING *URINARY PROBLEMS (pain or burning when urinating, or frequent urination) *BOWEL PROBLEMS (unusual diarrhea, constipation, pain near the anus) TENDERNESS IN MOUTH AND THROAT WITH OR WITHOUT PRESENCE OF ULCERS (sore throat, sores in mouth, or a toothache) UNUSUAL RASH, SWELLING OR PAIN  UNUSUAL VAGINAL DISCHARGE OR ITCHING   Items with * indicate a potential emergency and should be followed up as soon as possible or go to the Emergency Department if any problems should occur.  Please show the CHEMOTHERAPY ALERT CARD  or IMMUNOTHERAPY ALERT CARD at check-in to the Emergency Department and triage nurse.  Should you have questions after your visit or need to cancel or reschedule your appointment, please contact [facility] - A DEPT OF [facility]  [phone] and follow the prompts.  Office hours are 8:00 a.m. to 4:30 p.m. Monday - Friday. Please note that voicemails left after 4:00 p.m. may not be returned until the following business day.  We are closed weekends and major holidays. You have access to a nurse at all times for urgent questions. Please call the main number to the clinic [phone] and follow the prompts.  For any non-urgent questions, you may also contact your provider using MyChart. We now offer e-Visits for anyone 62 and older to request care online for non-urgent symptoms. For details visit [email or web address].   Also download the MyChart app! Go to the app store, search "MyChart", open the app, select , and log in with your MyChart username and password.

## 2024-04-04 NOTE — Progress Notes (Signed)
 "    Hematology/Oncology Consult note [facility]  [phone] [phone]  Patient Care Team: Pcp, No as PCP - General Maurie Rayfield BIRCH, RN as Oncology Nurse Navigator Melanee Annah BROCKS, MD as Consulting Physician (Oncology)   Name of the patient: Valerie Leonard  [id number]  May 24, 1955   Date of visit: 04/04/2024  Diagnosis-  Cancer Staging  Endometrial cancer [facility]) Staging form: Corpus Uteri - Carcinoma and Carcinosarcoma, AJCC 8th Edition and FIGO 2023 - Clinical stage from 12/02/2023: FIGO Stage IIIC1, calculated as Stage Unknown (cTX, cN1a, cM0) - Signed by Melanee Annah BROCKS, MD on 12/02/2023 Histologic grade (G): G2 Histologic grading system: 3 grade system - Pathologic stage from 12/02/2023: FIGO Stage IIIC - Unsigned Histopathologic type: Endometrioid adenocarcinoma, NOS Stage prefix: Initial diagnosis    Chief complaint/ Reason for visit-on treatment assessment prior to cycle 5 of CarboTaxol Keytruda  chemotherapy  Heme/Onc history: Patient is a 68 year old female who presented with post menopausal bleeding in June 2025.  Her hemoglobin had dropped down to 5.8 requiring PRBC transfusion.   Pelvic ultrasound in June 2025 showed large amount of blood products throughout the endometrial panel.  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  Wild-type staining for p53.  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  Enlarged bilateral iliac sided chain and pelvic sidewall lymph nodes compatible with nodal disease involvement.  Scattered pulmonary nodules measuring  up to 6 mm nonspecific but suspicious for metastatic disease.   Patient completed a course of palliative radiation to her vagina.  She is presently on CarboTaxol Keytruda  chemotherapy  Interval history- Discussed the use of AI scribe software for clinical note transcription with the patient, who gave verbal consent to proceed.  History of Present Illness   Valerie Leonard is a 68 year old female with stage IIIC endometrioid endometrial cancer who presents for ongoing oncologic management and assessment of treatment response.  She is currently undergoing her fifth cycle of chemotherapy. She expresses relief at nearing completion of chemotherapy. Recent imaging appears improved, though official reports are pending.  She is mildly anemic (hemoglobin 10.9), which is improved compared to initial presentation. Iron  levels remain stable without need for supplementation. She previously experienced significant vaginal bleeding at disease onset, requiring iron  infusions and urgent intervention, but this has resolved. She currently feels well and energetic, particularly following prior iron  infusions.  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 She does not require medication refills at this time and is receiving all necessary medications.  She was recently evaluated by Dr. Garwin. She has not had recent appointments with Dr. Elby but is open to future follow-up as needed.      ECOG PS- 2 Pain scale- 0   Review of systems- Review of Systems  Constitutional:  Negative for chills, fever, malaise/fatigue and weight loss.  HENT:  Negative for congestion, ear discharge and nosebleeds.   Eyes:  Negative for blurred vision.  Respiratory:  Negative for cough, hemoptysis, sputum production, shortness of breath and wheezing.   Cardiovascular:  Negative for chest pain, palpitations, orthopnea and claudication.  Gastrointestinal:  Negative for abdominal pain, blood  in stool, constipation,  diarrhea, heartburn, melena, nausea and vomiting.  Genitourinary:  Negative for dysuria, flank pain, frequency, hematuria and urgency.  Musculoskeletal:  Negative for back pain, joint pain and myalgias.  Skin:  Negative for rash.  Neurological:  Negative for dizziness, tingling, focal weakness, seizures, weakness and headaches.  Endo/Heme/Allergies:  Does not bruise/bleed easily.  Psychiatric/Behavioral:  Negative for depression and suicidal ideas. The patient does not have insomnia.       Allergies[1]   Past Medical History:  Diagnosis Date   Acute delirium    Acute metabolic encephalopathy    Anemia    blood loss anemia   Chronic alcohol abuse    Depression    Endometrial cancer (HCC)    Hypokalemia    Hypomagnesemia    Pressure injury of skin    Severe major depression, single episode, without psychotic features (HCC)    Vaginal bleeding      Past Surgical History:  Procedure Laterality Date   IR IMAGING GUIDED PORT INSERTION  12/17/2023   IR PATIENT EVAL TECH 0-60 MINS  12/10/2023   NO PAST SURGERIES      Social History   Socioeconomic History   Marital status: Single    Spouse name: Not on file   Number of children: 0   Years of education: Not on file   Highest education level: Not on file  Occupational History   Not on file  Tobacco Use   Smoking status: Former    Types: Cigarettes, Cigars   Smokeless tobacco: Never  Vaping Use   Vaping status: Never Used  Substance and Sexual Activity   Alcohol use: Yes    Alcohol/week: 3.0 standard drinks of alcohol    Types: 3 Cans of beer per week    Comment: More than 4 glasses of wine, mixed drinks, beer daily.    Drug use: Not Currently    Comment: former use of marijuana   Sexual activity: Not Currently    Birth control/protection: None  Other Topics Concern   Not on file  Social History Narrative   Lives by herself    Social Drivers of Health   Tobacco Use: Medium Risk  (04/04/2024)   Patient History    Smoking Tobacco Use: Former    Smokeless Tobacco Use: Never    Passive Exposure: Not on file  Financial Resource Strain: Low Risk  (10/13/2023)   Received from [facility]   Overall Financial Resource Strain (CARDIA)    Difficulty of Paying Living Expenses: Not very hard  Food Insecurity: No Food Insecurity (12/10/2023)   Epic    Worried About Programme Researcher, Broadcasting/film/video in the Last Year: Never true    Ran Out of Food in the Last Year: Never true  Recent Concern: Food Insecurity - Food Insecurity Present (09/27/2023)   Epic    Worried About Programme Researcher, Broadcasting/film/video in the Last Year: Sometimes true    Ran Out of Food in the Last Year: Sometimes true  Transportation Needs: No Transportation Needs (12/10/2023)   Epic    Lack of Transportation (Medical): No    Lack of Transportation (Non-Medical): No  Recent Concern: Transportation Needs - Unmet Transportation Needs (09/27/2023)   Epic    Lack of Transportation (Medical): Yes    Lack of Transportation (Non-Medical): Yes  Physical Activity: Not on file  Stress: Not on file  Social Connections: Unknown (12/11/2023)   Social Connection and Isolation Panel    Frequency of Communication with Friends and Family:  More than three times a week    Frequency of Social Gatherings with Friends and Family: More than three times a week    Attends Religious Services: Patient declined    Active Member of Clubs or Organizations: Patient declined    Attends Banker Meetings: Patient declined    Marital Status: Widowed  Recent Concern: Social Connections - Socially Isolated (09/28/2023)   Social Connection and Isolation Panel    Frequency of Communication with Friends and Family: Never    Frequency of Social Gatherings with Friends and Family: Never    Attends Religious Services: Never    Database Administrator or Organizations: No    Attends Banker Meetings: Never    Marital Status: Widowed   Intimate Partner Violence: Not At Risk (12/10/2023)   Epic    Fear of Current or Ex-Partner: No    Emotionally Abused: No    Physically Abused: No    Sexually Abused: No  Depression (PHQ2-9): Low Risk (04/04/2024)   Depression (PHQ2-9)    PHQ-2 Score: 0  Alcohol Screen: Not on file  Housing: Low Risk (12/10/2023)   Epic    Unable to Pay for Housing in the Last Year: No    Number of Times Moved in the Last Year: 0    Homeless in the Last Year: No  Utilities: Not At Risk (12/10/2023)   Epic    Threatened with loss of utilities: No  Health Literacy: Not on file    History reviewed. No pertinent family history.  Current Medications[2]  Physical exam:  Vitals:   04/04/24 0803  BP: 96/62  Pulse: 94  Temp: (!) 96.7 F (35.9 C)  TempSrc: Tympanic  SpO2: 98%  Weight: 120 lb (54.4 kg)  Height: 5' 1 (1.549 m)   Physical Exam Cardiovascular:     Rate and Rhythm: Normal rate and regular rhythm.     Heart sounds: Normal heart sounds.  Pulmonary:     Effort: Pulmonary effort is normal.     Breath sounds: Normal breath sounds.  Skin:    General: Skin is warm and dry.  Neurological:     Mental Status: She is alert and oriented to person, place, and time.      I have personally reviewed labs listed below:    Latest Ref Rng & Units 04/04/2024    8:06 AM  CMP  Glucose 70 - 99 mg/dL 899   BUN 8 - 23 mg/dL 10   Creatinine 9.55 - 1.00 mg/dL 9.49   Sodium 864 - 854 mmol/L 141   Potassium 3.5 - 5.1 mmol/L 4.0   Chloride 98 - 111 mmol/L 104   CO2 22 - 32 mmol/L 28   Calcium  8.9 - 10.3 mg/dL 9.3   Total Protein 6.5 - 8.1 g/dL 6.8   Total Bilirubin 0.0 - 1.2 mg/dL <9.7   Alkaline Phos 38 - 126 U/L 109   AST 15 - 41 U/L 20   ALT 0 - 44 U/L 11       Latest Ref Rng & Units 04/04/2024    8:06 AM  CBC  WBC 4.0 - 10.5 K/uL 4.4   Hemoglobin 12.0 - 15.0 g/dL 89.0   Hematocrit 63.9 - 46.0 % 34.0   Platelets 150 - 400 K/uL 192    I have personally reviewed Radiology images  listed below: No images are attached to the encounter.  CT CHEST ABDOMEN PELVIS W CONTRAST Result Date: 04/04/2024 EXAM: CT CHEST, ABDOMEN  AND PELVIS WITH CONTRAST 03/25/2024 01:35:38 PM TECHNIQUE: CT of the chest, abdomen and pelvis was performed with the administration of 100 mL of iohexol  (OMNIPAQUE ) 300 MG/ML solution. Multiplanar reformatted images are provided for review. Automated exposure control, iterative reconstruction, and/or weight based adjustment of the mA/kV was utilized to reduce the radiation dose to as low as reasonably achievable. COMPARISON: CT abdomen and pelvis 12/14/2023 and CT chest, abdomen, and pelvis. CLINICAL HISTORY: cancer cancer FINDINGS: CHEST: MEDIASTINUM AND LYMPH NODES: Heart and pericardium are unremarkable. The central airways are clear. No mediastinal, hilar or axillary lymphadenopathy. LUNGS AND PLEURA: No pleural effusion. Previous lung nodule within the right upper lobe measures 3 mm and is unchanged, image 17/4. Index nodule within the right upper lobe measures 7 mm, image 31/4. Unchanged from previous exam. Index nodule within the inferior lingula measures 7 mm, image 92/4. Previously 6 mm. Multiple new lung nodules are identified predominantly clustered within the right upper lobe. The largest is seen centrally measuring 0.9 cm, image 36/4. No focal consolidation or pulmonary edema. No pneumothorax. ABDOMEN AND PELVIS: LIVER: The liver is unremarkable. GALLBLADDER AND BILE DUCTS: Decompressed gallbladder containing multiple small stones. No biliary ductal dilatation. SPLEEN: Calcified granuloma noted within the spleen. PANCREAS: No acute abnormality. ADRENAL GLANDS: No acute abnormality. KIDNEYS, URETERS AND BLADDER: No stones in the kidneys or ureters. No hydronephrosis. No perinephric or periureteral stranding. Urinary bladder is unremarkable. GI AND BOWEL: Stomach demonstrates no acute abnormality. The appendix is visualized and appears normal. There is no bowel  obstruction. REPRODUCTIVE ORGANS: Tumor involving the lower uterine segment measures 2.4 x 3.9 x 3.7 cm, axial image 99/2. Previously 4.7 x 4.6 x 4.6 cm. PERITONEUM AND RETROPERITONEUM: No ascites. No free air. VASCULATURE: Aorta is normal in caliber. Aortic atherosclerotic calcification. ABDOMINAL AND PELVIS LYMPH NODES: Interval resolution of previous 1 cm left periaortic lymph node. Previously, an enlarged left external iliac lymph node measuring 0.6 cm was noted. The current measurement is 1.6 cm, as seen on image 96/2. The right external iliac lymph node measures 0.8 cm, image 95/2. Previously 2 cm. BONES AND SOFT TISSUES: No acute osseous abnormality. No focal soft tissue abnormality. IMPRESSION: 1. Tumor involving the lower uterine segment, measuring 2.4 x 3.9 x 3.7 cm, decreased in size compared to the prior study. 2. Interval decrease in size of previously enlarged external iliac lymph nodes and resolution of the prior left periaortic lymph node. 3. Previous index nodules are stable in size in the interval. 4. Multiple new lung nodules, clustered within the right upper lobe, with the largest measuring 0.9 cm. The clustered appearance of these nodules, limited to the right upper lobe, favors inflammatory/infectious etiology. Underlying metastatic disease is not excluded. Electronically signed by: Waddell Calk MD 04/04/2024 09:03 AM EST RP Workstation: [id number]     Assessment and plan- Patient is a 68 y.o. 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 -I have reviewed CT chest abdomen and pelvis images independently and discussed findings with the patient which shows overall improvement  in the size of intra-abdominal adenopathy as well as primary endometrial mass.  No evidence of disease progression. Plan to transition to pembrolizumab  monotherapy post 6 cycles of chemotherapy. Surgical intervention considered  post-treatment with further evaluation and imaging. - Scheduled to complete sixth cycle of chemotherapy in 3 weeks - Planned transition to pembrolizumab  monotherapy every six weeks post-chemotherapy.  Given that she has MSI high disease she will likely continue to have continued good response to treatment. - Will contact Dr. Elby for surgical evaluation after completion of chemotherapy and immunotherapy. - Will repeat imaging in 2-3 months to assess response and guide surgical planning. - Will notify her when official scan results are available.  Chemotherapy-induced anemia Mild anemia with hemoglobin at 10.9 g/dL, improved from initial presentation, attributed to chemotherapy. Iron  studies normal, no supplementation needed. - Monitored hemoglobin, iron  studies, leukocyte, and platelet counts. - No iron  supplementation indicated.         Visit Diagnosis 1. Endometrial cancer (HCC)   2. Encounter for antineoplastic chemotherapy   3. Encounter for antineoplastic immunotherapy      Dr. Annah Skene, MD, MPH [facility] [phone] 04/04/2024 11:28 AM                   [1] No Known Allergies [2]  Current Outpatient Medications:    ALPRAZolam  (XANAX ) 0.25 MG tablet, Take 1 tablet (0.25 mg total) by mouth 2 (two) times daily as needed for anxiety., Disp: 10 tablet, Rfl: 0   amoxicillin -clavulanate (AUGMENTIN ) 500-125 MG tablet, Take 1 tablet by mouth 2 (two) times daily. (Patient not taking: Reported on 04/04/2024), Disp: , Rfl:    [Paused] dexamethasone  (DECADRON ) 4 MG tablet, Take 2 tablets (8mg ) by mouth daily starting the day after carboplatin  for 3 days. Take with food (Patient not taking: Reported on 03/16/2024),  Disp: 30 tablet, Rfl: 1   diphenhydrAMINE  (BENADRYL ) 50 MG capsule, Take 1 capsule (50 mg total) by mouth at bedtime as needed for itching, allergies or sleep., Disp: , Rfl:    escitalopram  (LEXAPRO ) 5 MG tablet, Take 1 tablet (5 mg total) by mouth daily., Disp: 30 tablet, Rfl: 0   feeding supplement (ENSURE PLUS HIGH PROTEIN) LIQD, Take 237 mLs by mouth 3 (three) times daily between meals. (Patient not taking: Reported on 03/16/2024), Disp: , Rfl:    ferrous sulfate  325 (65 FE) MG tablet, Take 1 tablet (325 mg total) by mouth daily with breakfast., Disp: , Rfl:    folic acid  (FOLVITE ) 1 MG tablet, Take 1 tablet (1 mg total) by mouth daily., Disp: 30 tablet, Rfl: 0   lidocaine -prilocaine  (EMLA ) cream, Apply to affected area once (Patient not taking: Reported on 04/04/2024), Disp: 30 g, Rfl: 3   Multiple Vitamin (MULTIVITAMIN WITH MINERALS) TABS tablet, Take 1 tablet by mouth daily., Disp: 30 tablet, Rfl: 1   ondansetron  (ZOFRAN ) 8 MG tablet, Take 1 tablet (8 mg total) by mouth every 8 (eight) hours as needed for nausea or vomiting. Start on the third day after carboplatin ., Disp: 30 tablet, Rfl: 1   oxyCODONE  (OXY IR/ROXICODONE ) 5 MG immediate release tablet, Take 1 tablet (5 mg total) by mouth every 4 (four) hours as needed for severe pain (pain score 7-10). (Patient not taking: Reported on 03/16/2024), Disp: 60 tablet, Rfl: 0   prochlorperazine  (COMPAZINE ) 10 MG tablet, Take 1 tablet (10 mg total) by mouth every 6 (six) hours as needed for nausea or vomiting. (Patient not taking: Reported on 03/16/2024), Disp: 30 tablet, Rfl: 1   traZODone  (DESYREL ) 50 MG tablet, Take 1 tablet (50 mg total) by mouth at bedtime., Disp: 30 tablet, Rfl: 0 No current facility-administered medications for this visit.  Facility-Administered  Medications Ordered in Other Visits:    0.9 %  sodium chloride  infusion, , Intravenous, Continuous, Melanee Annah BROCKS, MD, Last Rate: 10 mL/hr at 04/04/24 0911, New Bag at 04/04/24 0911    CARBOplatin  (PARAPLATIN ) 340 mg in sodium chloride  0.9 % 100 mL chemo infusion, 340 mg, Intravenous, Once, Melanee Annah BROCKS, MD   PACLitaxel  (TAXOL ) 198 mg in sodium chloride  0.9 % 250 mL chemo infusion (> 80mg /m2), 135 mg/m2 (Treatment Plan Recorded), Intravenous, Once, Melanee Annah BROCKS, MD, Last Rate: 94 mL/hr at 04/04/24 1057, 198 mg at 04/04/24 1057  "

## 2024-04-15 ENCOUNTER — Other Ambulatory Visit (HOSPITAL_COMMUNITY): Payer: Self-pay

## 2024-04-22 ENCOUNTER — Encounter: Payer: Self-pay | Admitting: Oncology

## 2024-04-22 ENCOUNTER — Inpatient Hospital Stay: Attending: Obstetrics and Gynecology

## 2024-04-22 ENCOUNTER — Inpatient Hospital Stay

## 2024-04-22 ENCOUNTER — Inpatient Hospital Stay: Attending: Obstetrics and Gynecology | Admitting: Oncology

## 2024-04-22 VITALS — BP 105/62 | HR 93 | Temp 97.8°F | Resp 18 | Ht 61.0 in | Wt 123.4 lb

## 2024-04-22 VITALS — BP 99/56 | HR 91

## 2024-04-22 DIAGNOSIS — Z5112 Encounter for antineoplastic immunotherapy: Secondary | ICD-10-CM | POA: Diagnosis present

## 2024-04-22 DIAGNOSIS — Z923 Personal history of irradiation: Secondary | ICD-10-CM | POA: Insufficient documentation

## 2024-04-22 DIAGNOSIS — T451X5A Adverse effect of antineoplastic and immunosuppressive drugs, initial encounter: Secondary | ICD-10-CM | POA: Diagnosis not present

## 2024-04-22 DIAGNOSIS — D649 Anemia, unspecified: Secondary | ICD-10-CM

## 2024-04-22 DIAGNOSIS — C541 Malignant neoplasm of endometrium: Secondary | ICD-10-CM

## 2024-04-22 DIAGNOSIS — Z87891 Personal history of nicotine dependence: Secondary | ICD-10-CM | POA: Insufficient documentation

## 2024-04-22 DIAGNOSIS — Z5111 Encounter for antineoplastic chemotherapy: Secondary | ICD-10-CM | POA: Diagnosis present

## 2024-04-22 DIAGNOSIS — D6481 Anemia due to antineoplastic chemotherapy: Secondary | ICD-10-CM | POA: Diagnosis not present

## 2024-04-22 DIAGNOSIS — Z79620 Long term (current) use of immunosuppressive biologic: Secondary | ICD-10-CM | POA: Insufficient documentation

## 2024-04-22 LAB — CMP (CANCER CENTER ONLY)
ALT: 12 U/L (ref 0–44)
AST: 21 U/L (ref 15–41)
Albumin: 4 g/dL (ref 3.5–5.0)
Alkaline Phosphatase: 98 U/L (ref 38–126)
Anion gap: 10 (ref 5–15)
BUN: 9 mg/dL (ref 8–23)
CO2: 26 mmol/L (ref 22–32)
Calcium: 9.2 mg/dL (ref 8.9–10.3)
Chloride: 102 mmol/L (ref 98–111)
Creatinine: 0.43 mg/dL — ABNORMAL LOW (ref 0.44–1.00)
GFR, Estimated: >60 mL/min
Glucose, Bld: 88 mg/dL (ref 70–99)
Potassium: 4 mmol/L (ref 3.5–5.1)
Sodium: 137 mmol/L (ref 135–145)
Total Bilirubin: <0.2 mg/dL (ref 0.0–1.2)
Total Protein: 7 g/dL (ref 6.5–8.1)

## 2024-04-22 LAB — CBC WITH DIFFERENTIAL (CANCER CENTER ONLY)
Abs Immature Granulocytes: 0.11 K/uL — ABNORMAL HIGH (ref 0.00–0.07)
Basophils Absolute: 0.1 K/uL (ref 0.0–0.1)
Basophils Relative: 1 %
Eosinophils Absolute: 0 K/uL (ref 0.0–0.5)
Eosinophils Relative: 1 %
HCT: 31.9 % — ABNORMAL LOW (ref 36.0–46.0)
Hemoglobin: 10.4 g/dL — ABNORMAL LOW (ref 12.0–15.0)
Immature Granulocytes: 3 %
Lymphocytes Relative: 40 %
Lymphs Abs: 1.7 K/uL (ref 0.7–4.0)
MCH: 31 pg (ref 26.0–34.0)
MCHC: 32.6 g/dL (ref 30.0–36.0)
MCV: 94.9 fL (ref 80.0–100.0)
Monocytes Absolute: 0.6 K/uL (ref 0.1–1.0)
Monocytes Relative: 13 %
Neutro Abs: 1.8 K/uL (ref 1.7–7.7)
Neutrophils Relative %: 42 %
Platelet Count: 270 K/uL (ref 150–400)
RBC: 3.36 MIL/uL — ABNORMAL LOW (ref 3.87–5.11)
RDW: 13.6 % (ref 11.5–15.5)
WBC Count: 4.3 K/uL (ref 4.0–10.5)
nRBC: 0.5 % — ABNORMAL HIGH (ref 0.0–0.2)

## 2024-04-22 LAB — TSH: TSH: 0.552 u[IU]/mL (ref 0.350–4.500)

## 2024-04-22 MED ORDER — FAMOTIDINE IN NACL 20-0.9 MG/50ML-% IV SOLN
20.0000 mg | Freq: Once | INTRAVENOUS | Status: AC
  Administered 2024-04-22: 20 mg via INTRAVENOUS
  Filled 2024-04-22: qty 50

## 2024-04-22 MED ORDER — SODIUM CHLORIDE 0.9 % IV SOLN
200.0000 mg | Freq: Once | INTRAVENOUS | Status: AC
  Administered 2024-04-22: 200 mg via INTRAVENOUS
  Filled 2024-04-22: qty 8

## 2024-04-22 MED ORDER — APREPITANT 130 MG/18ML IV EMUL
130.0000 mg | Freq: Once | INTRAVENOUS | Status: AC
  Administered 2024-04-22: 130 mg via INTRAVENOUS
  Filled 2024-04-22: qty 18

## 2024-04-22 MED ORDER — SODIUM CHLORIDE 0.9 % IV SOLN
INTRAVENOUS | Status: DC
  Filled 2024-04-22: qty 250

## 2024-04-22 MED ORDER — DEXAMETHASONE SOD PHOSPHATE PF 10 MG/ML IJ SOLN
10.0000 mg | Freq: Once | INTRAMUSCULAR | Status: AC
  Administered 2024-04-22: 10 mg via INTRAVENOUS
  Filled 2024-04-22: qty 1

## 2024-04-22 MED ORDER — SODIUM CHLORIDE 0.9 % IV SOLN
340.0000 mg | Freq: Once | INTRAVENOUS | Status: AC
  Administered 2024-04-22: 340 mg via INTRAVENOUS
  Filled 2024-04-22: qty 34

## 2024-04-22 MED ORDER — DIPHENHYDRAMINE HCL 50 MG/ML IJ SOLN
25.0000 mg | Freq: Once | INTRAMUSCULAR | Status: AC
  Administered 2024-04-22: 25 mg via INTRAVENOUS
  Filled 2024-04-22: qty 1

## 2024-04-22 MED ORDER — SODIUM CHLORIDE 0.9 % IV SOLN
135.0000 mg/m2 | Freq: Once | INTRAVENOUS | Status: AC
  Administered 2024-04-22: 198 mg via INTRAVENOUS
  Filled 2024-04-22: qty 33

## 2024-04-22 MED ORDER — PALONOSETRON HCL INJECTION 0.25 MG/5ML
0.2500 mg | Freq: Once | INTRAVENOUS | Status: AC
  Administered 2024-04-22: 0.25 mg via INTRAVENOUS
  Filled 2024-04-22: qty 5

## 2024-04-22 NOTE — Progress Notes (Signed)
 Patient doing good; she is having some pain in her uterus area & back that comes & goes.

## 2024-04-22 NOTE — Progress Notes (Signed)
 "    Hematology/Oncology Consult note [facility]  [phone] [phone]  Patient Care Team: Pcp, No as PCP - General Maurie Rayfield BIRCH, RN as Oncology Nurse Navigator Melanee Annah BROCKS, MD as Consulting Physician (Oncology)   Name of the patient: Valerie Leonard  [id number]  [birth year]   Date of visit: 04/22/24  Diagnosis-  Cancer Staging  Endometrial cancer [facility]) Staging form: Corpus Uteri - Carcinoma and Carcinosarcoma, AJCC 8th Edition and FIGO 2023 - Clinical stage from 12/02/2023: FIGO Stage IIIC1, calculated as Stage Unknown (cTX, cN1a, cM0) - Signed by Melanee Annah BROCKS, MD on 12/02/2023 Histologic grade (G): G2 Histologic grading system: 3 grade system - Pathologic stage from 12/02/2023: FIGO Stage IIIC - Unsigned Histopathologic type: Endometrioid adenocarcinoma, NOS Stage prefix: Initial diagnosis    Chief complaint/ Reason for visit-on treatment assessment prior to cycle 6 of CarboTaxol Keytruda  chemotherapy  Heme/Onc history:  Patient is a 69 year old female who presented with post menopausal bleeding in June 2025.  Her hemoglobin had dropped down to 5.8 requiring PRBC transfusion.   Pelvic ultrasound in June 2025 showed large amount of blood products throughout the endometrial panel.  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  Wild-type staining for p53.  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  Enlarged bilateral iliac sided chain and pelvic sidewall lymph nodes compatible with nodal disease involvement.  Scattered pulmonary nodules measuring  up to 6 mm nonspecific but suspicious for metastatic disease.   Patient completed a course of palliative radiation to her vagina.  She is presently on CarboTaxol Keytruda  chemotherapy which she will receive for 6 cycles followed by maintenance Keytruda   Interval history- Valerie Leonard is a 69 year old female with endometrial cancer who presents for follow-up of cancer treatment and chemotherapy-induced anemia.  She is completing her final cycle of chemotherapy for endometrial cancer. She has tolerated prior cycles without febrile neutropenia or need for granulocyte colony-stimulating factor. Laboratory values show white blood cell count 4.3, hemoglobin 10.4, platelets 270, and neutrophils 1.8, all adequate for continued therapy. She has not experienced fever during treatment. Imaging from December demonstrated stable disease, with the next scan planned for March.  She has experienced weight gain following a period of significant weight loss and frailty, which she and her care team view positively. She is mindful of her weight given her height of 5'1 and wishes to avoid excessive gain. She is participating in physical therapy, working towards increased mobility, and is able to stand and dance in her room.      ECOG PS- 2 Pain scale- 0 Opioid associated constipation- no  Review of systems- Review of Systems  Constitutional:  Negative for chills, fever, malaise/fatigue and weight loss.  HENT:  Negative for congestion, ear discharge and nosebleeds.   Eyes:  Negative for blurred vision.  Respiratory:  Negative for cough, hemoptysis, sputum production, shortness of breath and wheezing.   Cardiovascular:  Negative for chest pain, palpitations, orthopnea and claudication.  Gastrointestinal:  Negative for abdominal pain, blood in stool, constipation, diarrhea, heartburn, melena, nausea and vomiting.  Genitourinary:  Negative for dysuria, flank pain, frequency, hematuria and urgency.  Musculoskeletal:   Negative  for back pain, joint pain and myalgias.  Skin:  Negative for rash.  Neurological:  Negative for dizziness, tingling, focal weakness, seizures, weakness and headaches.  Endo/Heme/Allergies:  Does not bruise/bleed easily.  Psychiatric/Behavioral:  Negative for depression and suicidal ideas. The patient does not have insomnia.       Allergies[1]   Past Medical History:  Diagnosis Date   Acute delirium    Acute metabolic encephalopathy    Anemia    blood loss anemia   Chronic alcohol abuse    Depression    Endometrial cancer (HCC)    Hypokalemia    Hypomagnesemia    Pressure injury of skin    Severe major depression, single episode, without psychotic features (HCC)    Vaginal bleeding      Past Surgical History:  Procedure Laterality Date   IR IMAGING GUIDED PORT INSERTION  12/17/2023   IR PATIENT EVAL TECH 0-60 MINS  12/10/2023   NO PAST SURGERIES      Social History   Socioeconomic History   Marital status: Single    Spouse name: Not on file   Number of children: 0   Years of education: Not on file   Highest education level: Not on file  Occupational History   Not on file  Tobacco Use   Smoking status: Former    Types: Cigarettes, Cigars   Smokeless tobacco: Never  Vaping Use   Vaping status: Never Used  Substance and Sexual Activity   Alcohol use: Yes    Alcohol/week: 3.0 standard drinks of alcohol    Types: 3 Cans of beer per week    Comment: More than 4 glasses of wine, mixed drinks, beer daily.    Drug use: Not Currently    Comment: former use of marijuana   Sexual activity: Not Currently    Birth control/protection: None  Other Topics Concern   Not on file  Social History Narrative   Lives by herself    Social Drivers of Health   Tobacco Use: Medium Risk (04/22/2024)   Patient History    Smoking Tobacco Use: Former    Smokeless Tobacco Use: Never    Passive Exposure: Not on file  Financial Resource Strain: Low Risk  (10/13/2023)    Received from [facility]   Overall Financial Resource Strain (CARDIA)    Difficulty of Paying Living Expenses: Not very hard  Food Insecurity: No Food Insecurity (12/10/2023)   Epic    Worried About Programme Researcher, Broadcasting/film/video in the Last Year: Never true    Ran Out of Food in the Last Year: Never true  Recent Concern: Food Insecurity - Food Insecurity Present (09/27/2023)   Epic    Worried About Programme Researcher, Broadcasting/film/video in the Last Year: Sometimes true    Ran Out of Food in the Last Year: Sometimes true  Transportation Needs: No Transportation Needs (12/10/2023)   Epic    Lack of Transportation (Medical): No    Lack of Transportation (Non-Medical): No  Recent Concern: Transportation Needs - Unmet Transportation Needs (09/27/2023)   Epic    Lack of Transportation (Medical): Yes    Lack of Transportation (Non-Medical): Yes  Physical Activity: Not on file  Stress: Not on file  Social Connections: Unknown (12/11/2023)   Social Connection and Isolation Panel    Frequency of Communication with Friends and Family: More than three times a week    Frequency of Social Gatherings with Friends and Family: More than three times a  week    Attends Religious Services: Patient declined    Active Member of Clubs or Organizations: Patient declined    Attends Banker Meetings: Patient declined    Marital Status: Widowed  Recent Concern: Social Connections - Socially Isolated (09/28/2023)   Social Connection and Isolation Panel    Frequency of Communication with Friends and Family: Never    Frequency of Social Gatherings with Friends and Family: Never    Attends Religious Services: Never    Database Administrator or Organizations: No    Attends Banker Meetings: Never    Marital Status: Widowed  Intimate Partner Violence: Not At Risk (12/10/2023)   Epic    Fear of Current or Ex-Partner: No    Emotionally Abused: No    Physically Abused: No    Sexually Abused: No   Depression (PHQ2-9): Low Risk (04/22/2024)   Depression (PHQ2-9)    PHQ-2 Score: 0  Alcohol Screen: Not on file  Housing: Low Risk (12/10/2023)   Epic    Unable to Pay for Housing in the Last Year: No    Number of Times Moved in the Last Year: 0    Homeless in the Last Year: No  Utilities: Not At Risk (12/10/2023)   Epic    Threatened with loss of utilities: No  Health Literacy: Not on file    History reviewed. No pertinent family history.  Current Medications[2]  Physical exam:  Vitals:   04/22/24 0929  BP: 105/62  Pulse: 93  Resp: 18  Temp: 97.8 F (36.6 C)  TempSrc: Tympanic  SpO2: 100%  Weight: 123 lb 6.4 oz (56 kg)  Height: 5' 1 (1.549 m)   Physical Exam Cardiovascular:     Rate and Rhythm: Normal rate and regular rhythm.     Heart sounds: Normal heart sounds.  Pulmonary:     Effort: Pulmonary effort is normal.     Breath sounds: Normal breath sounds.  Skin:    General: Skin is warm and dry.  Neurological:     Mental Status: She is alert and oriented to person, place, and time.      I have personally reviewed labs listed below:    Latest Ref Rng & Units 04/22/2024    8:56 AM  CMP  Glucose 70 - 99 mg/dL 88   BUN 8 - 23 mg/dL 9   Creatinine 9.55 - 8.99 mg/dL 9.56   Sodium 864 - 854 mmol/L 137   Potassium 3.5 - 5.1 mmol/L 4.0   Chloride 98 - 111 mmol/L 102   CO2 22 - 32 mmol/L 26   Calcium  8.9 - 10.3 mg/dL 9.2   Total Protein 6.5 - 8.1 g/dL 7.0   Total Bilirubin 0.0 - 1.2 mg/dL <9.7   Alkaline Phos 38 - 126 U/L 98   AST 15 - 41 U/L 21   ALT 0 - 44 U/L 12       Latest Ref Rng & Units 04/22/2024    8:56 AM  CBC  WBC 4.0 - 10.5 K/uL 4.3   Hemoglobin 12.0 - 15.0 g/dL 89.5   Hematocrit 63.9 - 46.0 % 31.9   Platelets 150 - 400 K/uL 270    I have personally reviewed Radiology images listed below: No images are attached to the encounter.  CT CHEST ABDOMEN PELVIS W CONTRAST Result Date: 04/04/2024 EXAM: CT CHEST, ABDOMEN AND PELVIS WITH CONTRAST  03/25/2024 01:35:38 PM TECHNIQUE: CT of the chest, abdomen and pelvis was performed with  the administration of 100 mL of iohexol  (OMNIPAQUE ) 300 MG/ML solution. Multiplanar reformatted images are provided for review. Automated exposure control, iterative reconstruction, and/or weight based adjustment of the mA/kV was utilized to reduce the radiation dose to as low as reasonably achievable. COMPARISON: CT abdomen and pelvis 12/14/2023 and CT chest, abdomen, and pelvis. CLINICAL HISTORY: cancer cancer FINDINGS: CHEST: MEDIASTINUM AND LYMPH NODES: Heart and pericardium are unremarkable. The central airways are clear. No mediastinal, hilar or axillary lymphadenopathy. LUNGS AND PLEURA: No pleural effusion. Previous lung nodule within the right upper lobe measures 3 mm and is unchanged, image 17/4. Index nodule within the right upper lobe measures 7 mm, image 31/4. Unchanged from previous exam. Index nodule within the inferior lingula measures 7 mm, image 92/4. Previously 6 mm. Multiple new lung nodules are identified predominantly clustered within the right upper lobe. The largest is seen centrally measuring 0.9 cm, image 36/4. No focal consolidation or pulmonary edema. No pneumothorax. ABDOMEN AND PELVIS: LIVER: The liver is unremarkable. GALLBLADDER AND BILE DUCTS: Decompressed gallbladder containing multiple small stones. No biliary ductal dilatation. SPLEEN: Calcified granuloma noted within the spleen. PANCREAS: No acute abnormality. ADRENAL GLANDS: No acute abnormality. KIDNEYS, URETERS AND BLADDER: No stones in the kidneys or ureters. No hydronephrosis. No perinephric or periureteral stranding. Urinary bladder is unremarkable. GI AND BOWEL: Stomach demonstrates no acute abnormality. The appendix is visualized and appears normal. There is no bowel obstruction. REPRODUCTIVE ORGANS: Tumor involving the lower uterine segment measures 2.4 x 3.9 x 3.7 cm, axial image 99/2. Previously 4.7 x 4.6 x 4.6 cm. PERITONEUM AND  RETROPERITONEUM: No ascites. No free air. VASCULATURE: Aorta is normal in caliber. Aortic atherosclerotic calcification. ABDOMINAL AND PELVIS LYMPH NODES: Interval resolution of previous 1 cm left periaortic lymph node. Previously, an enlarged left external iliac lymph node measuring 0.6 cm was noted. The current measurement is 1.6 cm, as seen on image 96/2. The right external iliac lymph node measures 0.8 cm, image 95/2. Previously 2 cm. BONES AND SOFT TISSUES: No acute osseous abnormality. No focal soft tissue abnormality. IMPRESSION: 1. Tumor involving the lower uterine segment, measuring 2.4 x 3.9 x 3.7 cm, decreased in size compared to the prior study. 2. Interval decrease in size of previously enlarged external iliac lymph nodes and resolution of the prior left periaortic lymph node. 3. Previous index nodules are stable in size in the interval. 4. Multiple new lung nodules, clustered within the right upper lobe, with the largest measuring 0.9 cm. The clustered appearance of these nodules, limited to the right upper lobe, favors inflammatory/infectious etiology. Underlying metastatic disease is not excluded. Electronically signed by: Waddell Calk MD 04/04/2024 09:03 AM EST RP Workstation: [id number]     Assessment and plan- Patient is a 69 y.o. 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  She is here for on treatment assessment prior to cycle 6 of CarboTaxol Keytruda  chemotherapy  Assessment and Plan    Endometrial cancer Completed final chemotherapy cycle6 CarboTaxol Keytruda  today.  Counts okay to proceed with treatment today.  Transition to maintenance pembrolizumab  planned which she will be getting at 400 mg dosing every 6 weeks for 14 cycles - Administered final chemotherapy cycle. - ANC is 1.8 today but I am holding off on giving her Neulasta as this is her last chemotherapy and she has not required Neulasta so far. -  I will plan to repeat her imaging in 6  weeks   Chemotherapy-induced anemia Mild, stable anemia due to chemotherapy.  Hemoglobin expected to improve post-chemotherapy.  She had evidence of iron  deficiency anemia in the past which improved after IV iron  and currently stable between 10-11 - Monitor hemoglobin with lab assessments. - Anticipate hemoglobin improvement with pembrolizumab  initiation.         Visit Diagnosis 1. Endometrial cancer (HCC)   2. Encounter for antineoplastic immunotherapy      Dr. Annah Skene, MD, MPH [facility] at [facility] [phone] 04/22/2024 11:21 AM                   [1] No Known Allergies [2]  Current Outpatient Medications:    ALPRAZolam  (XANAX ) 0.25 MG tablet, Take 1 tablet (0.25 mg total) by mouth 2 (two) times daily as needed for anxiety., Disp: 10 tablet, Rfl: 0   amoxicillin -clavulanate (AUGMENTIN ) 500-125 MG tablet, Take 1 tablet by mouth 2 (two) times daily., Disp: , Rfl:    diphenhydrAMINE  (BENADRYL ) 50 MG capsule, Take 1 capsule (50 mg total) by mouth at bedtime as needed for itching, allergies or sleep., Disp: , Rfl:    escitalopram  (LEXAPRO ) 5 MG tablet, Take 1 tablet (5 mg total) by mouth daily., Disp: 30 tablet, Rfl: 0   ferrous sulfate  325 (65 FE) MG tablet, Take 1 tablet (325 mg total) by mouth daily with breakfast., Disp: , Rfl:    folic acid  (FOLVITE ) 1 MG tablet, Take 1 tablet (1 mg total) by mouth daily., Disp: 30 tablet, Rfl: 0   mirtazapine (REMERON) 7.5 MG tablet, Take 7.5 mg by mouth at bedtime., Disp: , Rfl:    Multiple Vitamin (MULTIVITAMIN WITH MINERALS) TABS tablet, Take 1 tablet by mouth daily., Disp: 30 tablet, Rfl: 1   ondansetron  (ZOFRAN ) 8 MG tablet, Take 1 tablet (8 mg total) by mouth every 8 (eight) hours as needed for nausea or vomiting. Start on the third day after carboplatin ., Disp: 30 tablet, Rfl: 1   traZODone  (DESYREL ) 50 MG tablet, Take 1 tablet (50 mg total) by mouth at bedtime., Disp: 30 tablet, Rfl: 0   [Paused]  dexamethasone  (DECADRON ) 4 MG tablet, Take 2 tablets (8mg ) by mouth daily starting the day after carboplatin  for 3 days. Take with food (Patient not taking: Reported on 03/16/2024), Disp: 30 tablet, Rfl: 1   feeding supplement (ENSURE PLUS HIGH PROTEIN) LIQD, Take 237 mLs by mouth 3 (three) times daily between meals. (Patient not taking: Reported on 03/16/2024), Disp: , Rfl:    lidocaine -prilocaine  (EMLA ) cream, Apply to affected area once (Patient not taking: Reported on 04/04/2024), Disp: 30 g, Rfl: 3   oxyCODONE  (OXY IR/ROXICODONE ) 5 MG immediate release tablet, Take 1 tablet (5 mg total) by mouth every 4 (four) hours as needed for severe pain (pain score 7-10). (Patient not taking: Reported on 03/16/2024), Disp: 60 tablet, Rfl: 0   prochlorperazine  (COMPAZINE ) 10 MG tablet, Take 1 tablet (10 mg total) by mouth every 6 (six) hours as needed for nausea or vomiting. (Patient not taking: Reported on 03/16/2024), Disp: 30 tablet, Rfl: 1 No current facility-administered medications for this visit.  Facility-Administered Medications Ordered in Other Visits:    0.9 %  sodium chloride  infusion, , Intravenous, Continuous, Skene Annah BROCKS, MD, Last Rate: 10 mL/hr at 04/22/24 1047, New Bag at 04/22/24 1047   CARBOplatin  (PARAPLATIN ) 340 mg in sodium chloride  0.9 % 100 mL chemo infusion, 340 mg, Intravenous, Once, Skene Annah BROCKS, MD   PACLitaxel  (TAXOL ) 198 mg in sodium chloride  0.9 % 250 mL chemo infusion (> 80mg /m2), 135 mg/m2 (Treatment Plan  Recorded), Intravenous, Once, Melanee Annah BROCKS, MD   pembrolizumab  (KEYTRUDA ) 200 mg in sodium chloride  0.9 % 50 mL chemo infusion, 200 mg, Intravenous, Once, Melanee Annah BROCKS, MD  "

## 2024-04-23 LAB — T4: T4, Total: 9.3 ug/dL (ref 4.5–12.0)

## 2024-04-24 ENCOUNTER — Other Ambulatory Visit: Payer: Self-pay

## 2024-04-24 ENCOUNTER — Emergency Department
Admission: EM | Admit: 2024-04-24 | Discharge: 2024-04-24 | Disposition: A | Discharge status: Home / Self Care | Attending: Emergency Medicine | Admitting: Emergency Medicine

## 2024-04-24 ENCOUNTER — Emergency Department

## 2024-04-24 DIAGNOSIS — Z8542 Personal history of malignant neoplasm of other parts of uterus: Secondary | ICD-10-CM | POA: Insufficient documentation

## 2024-04-24 DIAGNOSIS — R42 Dizziness and giddiness: Secondary | ICD-10-CM | POA: Diagnosis present

## 2024-04-24 LAB — CBC WITH DIFFERENTIAL/PLATELET
Abs Immature Granulocytes: 0.05 K/uL (ref 0.00–0.07)
Basophils Absolute: 0 K/uL (ref 0.0–0.1)
Basophils Relative: 0 %
Eosinophils Absolute: 0 K/uL (ref 0.0–0.5)
Eosinophils Relative: 0 %
HCT: 33.4 % — ABNORMAL LOW (ref 36.0–46.0)
Hemoglobin: 10.7 g/dL — ABNORMAL LOW (ref 12.0–15.0)
Immature Granulocytes: 1 %
Lymphocytes Relative: 32 %
Lymphs Abs: 1.5 K/uL (ref 0.7–4.0)
MCH: 30.8 pg (ref 26.0–34.0)
MCHC: 32 g/dL (ref 30.0–36.0)
MCV: 96.3 fL (ref 80.0–100.0)
Monocytes Absolute: 0.2 K/uL (ref 0.1–1.0)
Monocytes Relative: 4 %
Neutro Abs: 3 K/uL (ref 1.7–7.7)
Neutrophils Relative %: 63 %
Platelets: 280 K/uL (ref 150–400)
RBC: 3.47 MIL/uL — ABNORMAL LOW (ref 3.87–5.11)
RDW: 13.8 % (ref 11.5–15.5)
WBC: 4.8 K/uL (ref 4.0–10.5)
nRBC: 0 % (ref 0.0–0.2)

## 2024-04-24 LAB — COMPREHENSIVE METABOLIC PANEL WITH GFR
ALT: 12 U/L (ref 0–44)
AST: 26 U/L (ref 15–41)
Albumin: 3.8 g/dL (ref 3.5–5.0)
Alkaline Phosphatase: 95 U/L (ref 38–126)
Anion gap: 9 (ref 5–15)
BUN: 10 mg/dL (ref 8–23)
CO2: 28 mmol/L (ref 22–32)
Calcium: 9.3 mg/dL (ref 8.9–10.3)
Chloride: 106 mmol/L (ref 98–111)
Creatinine, Ser: 0.41 mg/dL — ABNORMAL LOW (ref 0.44–1.00)
GFR, Estimated: >60 mL/min
Glucose, Bld: 90 mg/dL (ref 70–99)
Potassium: 3.8 mmol/L (ref 3.5–5.1)
Sodium: 144 mmol/L (ref 135–145)
Total Bilirubin: 0.3 mg/dL (ref 0.0–1.2)
Total Protein: 7 g/dL (ref 6.5–8.1)

## 2024-04-24 LAB — MAGNESIUM: Magnesium: 2.1 mg/dL (ref 1.7–2.4)

## 2024-04-24 LAB — TROPONIN T, HIGH SENSITIVITY: Troponin T High Sensitivity: <15 ng/L (ref 0–19)

## 2024-04-24 MED ORDER — LACTATED RINGERS IV BOLUS
1000.0000 mL | Freq: Once | INTRAVENOUS | Status: AC
  Administered 2024-04-24: 1000 mL via INTRAVENOUS

## 2024-04-24 NOTE — ED Notes (Signed)
 Received call from sister of patient, will be here to pick her up to go back to the [place] in 35 to 40 minutes

## 2024-04-24 NOTE — ED Triage Notes (Signed)
 Pt arriving via EMS from [place] [facility] [place]. EMS reports pt was at eating breakfast and had a seizure witnessed by another resident, staff described it as tonic/clonic. Pt has no Hx of seizures. Pt has endometrial cancer and has chemo Tx earlier this week. EMS reports no post ictal.  EMS vitals BP 116/73 HR 87 SPO2 95% RA Temp 98.2 F

## 2024-04-24 NOTE — ED Triage Notes (Signed)
 Pt arriving via EMS from [place] [facility] [place]. EMS reports pt was at eating breakfast and had a seizure witnessed by another resident, staff described it as tonic/clonic. Pt has no Hx of seizures. Pt has endometrial cancer and has chemo Tx earlier this week. EMS reports no post ictal. Pt sharing during triage that she vaped right before breakfast.   EMS vitals BP 116/73 HR 87 SPO2 95% RA Temp 98.2 F

## 2024-04-24 NOTE — ED Provider Notes (Signed)
 "  [facility] Provider Note    Event Date/Time   First MD Initiated Contact with Patient 04/24/24 0913     (approximate)   History   Seizures   HPI  GIA LUSHER is a 69 y.o. female who presents to the ED for evaluation of Seizures   I reviewed an oncology clinic visit from 2 days ago.  History of endometrial cancer undergoing chemotherapy, local nodal disease, pulmonary nodules thought to be metastatic.  Patient presents to the ED due to possible witnessed seizure.  EMS reports patient was seated in the dining area of her facility when she had a witnessed tonic-clonic seizure, back to baseline by the time EMS arrives and no issues during transport.  Patient reports a very different story.  She reports being seated in the dining hall and using another residents a vape pen, she is uncertain what sort of vape it was, possibly nicotine.  She reports developing some dizziness after using this and another resident who is very nosy freaked out and made them call 911 but patient reports nothing actually happened.  Patient denies any disruption of consciousness and reports that she is fine and just wants to go home.   Physical Exam   Triage Vital Signs: ED Triage Vitals  Encounter Vitals Group     BP      Girls Systolic BP Percentile      Girls Diastolic BP Percentile      Boys Systolic BP Percentile      Boys Diastolic BP Percentile      Pulse      Resp      Temp      Temp src      SpO2      Weight      Height      Head Circumference      Peak Flow      Pain Score      Pain Loc      Pain Education      Exclude from Growth Chart     Most recent vital signs: Vitals:   04/24/24 0923 04/24/24 0924  BP:  (!) 100/59  Pulse:  79  Resp:  13  Temp:  98.3 F (36.8 C)  SpO2: 100%     General: Awake, no distress.  Sitting upright in bed, awake, pleasant and conversational. CV:  Good peripheral perfusion.  Resp:  Normal effort.  Abd:  No  distention.  MSK:  No deformity noted.  Neuro:  No focal deficits appreciated. Cranial nerves II through XII intact 5/5 strength and sensation in all 4 extremities Other:     ED Results / Procedures / Treatments   Labs (all labs ordered are listed, but only abnormal results are displayed) Labs Reviewed  CBC WITH DIFFERENTIAL/PLATELET - Abnormal; Notable for the following components:      Result Value   RBC 3.47 (*)    Hemoglobin 10.7 (*)    HCT 33.4 (*)    All other components within normal limits  COMPREHENSIVE METABOLIC PANEL WITH GFR  MAGNESIUM   TROPONIN T, HIGH SENSITIVITY    EKG Sinus rhythm with a rate of 78 bpm.  Normal axis and intervals.  No signs of acute ischemia.  RADIOLOGY   Official radiology report(s): No results found.  PROCEDURES and INTERVENTIONS:  .1-3 Lead EKG Interpretation  Performed by: Claudene Rover, MD Authorized by: Claudene Rover, MD     Interpretation: normal     ECG rate:  76   ECG rate assessment: normal     Rhythm: sinus rhythm     Ectopy: none     Conduction: normal     Medications  lactated ringers  bolus 1,000 mL (1,000 mLs Intravenous New Bag/Given 04/24/24 0943)     IMPRESSION / MDM / ASSESSMENT AND PLAN / ED COURSE  I reviewed the triage vital signs and the nursing notes.  Differential diagnosis includes, but is not limited to, seizures, stroke, BPPV, medication side effect, polypharmacy, no pathology  {Patient presents with symptoms of an acute illness or injury that is potentially life-threatening.  Patient presents after reported episode of dizziness after using a vape pen at her nursing facility, demanding discharge after benign workup.  Normal exam without witnessed seizure activity, uncertain if she actually had a seizure at her facility though she denies any such pathology.  Refuses CT head.  Normal sodium, troponin and CBC.  No barriers to outpatient management at this point.  Clinical Course as of 04/24/24 1006  Sun  Apr 24, 2024  0945 CT technician pulls me aside and tells me that patient is refusing CT imaging and that she is demanding discharge immediately.  I then go to the bedside and discussed with the patient as documented above in HPI. [DS]    Clinical Course User Index [DS] Claudene Rover, MD     FINAL CLINICAL IMPRESSION(S) / ED DIAGNOSES   Final diagnoses:  Dizziness     Rx / DC Orders   ED Discharge Orders     None        Note:  This document was prepared using Dragon voice recognition software and may include unintentional dictation errors.   Claudene Rover, MD 04/24/24 1511  "

## 2024-05-11 ENCOUNTER — Telehealth: Payer: Self-pay | Admitting: Oncology

## 2024-05-11 ENCOUNTER — Telehealth: Payer: Self-pay | Admitting: Obstetrics and Gynecology

## 2024-05-11 ENCOUNTER — Inpatient Hospital Stay: Attending: Obstetrics and Gynecology | Admitting: Obstetrics and Gynecology

## 2024-05-11 VITALS — BP 124/75 | HR 107 | Temp 98.6°F | Resp 20 | Wt 125.8 lb

## 2024-05-11 DIAGNOSIS — C541 Malignant neoplasm of endometrium: Secondary | ICD-10-CM | POA: Insufficient documentation

## 2024-05-11 DIAGNOSIS — Z9221 Personal history of antineoplastic chemotherapy: Secondary | ICD-10-CM | POA: Insufficient documentation

## 2024-05-11 DIAGNOSIS — Z923 Personal history of irradiation: Secondary | ICD-10-CM | POA: Insufficient documentation

## 2024-05-11 NOTE — Telephone Encounter (Signed)
 Called pt to sched CT - no answer and no vm set up - will call again - [facility]

## 2024-05-11 NOTE — Progress Notes (Signed)
 Patient was seen in Procedure room 1 of the [facility] GYN ONC Department., where I assisted with pelvic exam. I, Rayfield Jasmine RN, was present as chaperone for the sensitive portion of the exam.

## 2024-05-11 NOTE — Telephone Encounter (Signed)
 error

## 2024-05-11 NOTE — Progress Notes (Signed)
 Gynecologic Oncology Consult Visit   Referring Provider: Dr. Garnette Mace  Chief Concern: Endometrial cancer  Subjective:  Valerie Leonard is a 69 y.o. female who is seen in consultation from Dr. Melanee for grade 2 endometrial cancer, now s/p palliative radiation for bleeding with severe anemia, and 6 cycles of carbo-paclitaxel -pembrolizumab  with Dr Melanee.   Vaginal bleeding has resolved. She feels stronger. Now at SNF[facility]. She had cycle 6 of treatment 3 weeks ago.   CT scan 03/25/25 CHEST: MEDIASTINUM AND LYMPH NODES: Heart and pericardium are unremarkable. The central airways are clear. No mediastinal, hilar or axillary lymphadenopathy.   LUNGS AND PLEURA: No pleural effusion. Previous lung nodule within the right upper lobe measures 3 mm and is unchanged, image 17/4. Index nodule within the right upper lobe measures 7 mm, image 31/4. Unchanged from previous exam. Index nodule within the inferior lingula measures 7 mm, image 92/4. Previously 6 mm. Multiple new lung nodules are identified predominantly clustered within the right upper lobe. The largest is seen centrally measuring 0.9 cm, image 36/4. No focal consolidation or pulmonary edema. No pneumothorax.   ABDOMEN AND PELVIS: LIVER: The liver is unremarkable.   GALLBLADDER AND BILE DUCTS: Decompressed gallbladder containing multiple small stones. No biliary ductal dilatation.   SPLEEN: Calcified granuloma noted within the spleen.   PANCREAS: No acute abnormality.   ADRENAL GLANDS: No acute abnormality.   KIDNEYS, URETERS AND BLADDER: No stones in the kidneys or ureters. No hydronephrosis. No perinephric or periureteral stranding. Urinary bladder is unremarkable.   GI AND BOWEL: Stomach demonstrates no acute abnormality. The appendix is visualized and appears normal. There is no bowel obstruction.   REPRODUCTIVE ORGANS: Tumor involving the lower uterine segment measures 2.4 x 3.9 x 3.7 cm, axial image 99/2.  Previously 4.7 x 4.6 x 4.6 cm.   PERITONEUM AND RETROPERITONEUM: No ascites. No free air.   VASCULATURE: Aorta is normal in caliber. Aortic atherosclerotic calcification.   ABDOMINAL AND PELVIS LYMPH NODES: Interval resolution of previous 1 cm left periaortic lymph node. Previously, an enlarged left external iliac lymph node measuring 0.6 cm was noted. The current measurement is 1.6 cm, as seen on image 96/2. The right external iliac lymph node measures 0.8 cm, image 95/2. Previously 2 cm.   BONES AND SOFT TISSUES: No acute osseous abnormality. No focal soft tissue abnormality.   IMPRESSION: 1. Tumor involving the lower uterine segment, measuring 2.4 x 3.9 x 3.7 cm, decreased in size compared to the prior study. 2. Interval decrease in size of previously enlarged external iliac lymph nodes and resolution of the prior left periaortic lymph node. 3. Previous index nodules are stable in size in the interval. 4. Multiple new lung nodules, clustered within the right upper lobe, with the largest measuring 0.9 cm. The clustered appearance of these nodules, limited to the right upper lobe, favors inflammatory/infectious etiology. Underlying metastatic disease is not excluded.  Gynecologic Oncology History:  She initially presented with postmenopausal vaginal bleeding. She experienced significant vaginal bleeding, severe enough to require emergency medical attention.    During her hospital stay, she underwent an ultrasound and received medication that successfully stopped the bleeding but caused xerostomia. She also required a blood transfusion due to a hemoglobin level of 5.8 g/dL, attributed to the significant blood loss. Hemoglobin on discharge: 8.4 (s/p 2 units pRBCs).   Pelvic ultrasound 09/27/2023:  FINDINGS:  Uterus  Measurements: 8.7 cm x 3.4 cm x 4.4 cm = volume: 68 mL. No fibroids or other mass  visualized.  Endometrium  Thickness: 3.9 mm. A large amount of blood products are  suspected throughout the endometrial canal.  Right ovary  Measurements: 2.1 cm x 1.2 cm x 1.4 cm = volume: 1.8 mL. Normal appearance/no adnexal mass.  Left ovary  Measurements: 2.1 cm x 1.4 cm x 1.3 cm = volume: 2.0 mL. Normal appearance/no adnexal mass.  IMPRESSION:  Large amount of blood products blood products suspected throughout the endometrial canal. Correlation with 6-12 week  follow-up pelvic ultrasound is recommended to further exclude the presence of an underlying endometrial lesion.    Endometrial Biopsy: MODERATELY DIFFERENTIATED ENDOMETRIOID ADENOCARCINOMA (FIGO II).   P16, patchy staining   ER, positive  wild-type staining for P53 Mismatch repair  MLH1-Loss of nuclear expression  MSH2-intact nuclear expression  MSH6-intact nuclear expression  PMS2-Loss of nuclear expression  Testing for methylation of MLH1 promoter will be requested MLH1 methylation Testing: POSITIVE Hypermethylation of the MLH1 promoter was detected in the provided specimen. Hypermethylation of the MLH1 promoter and presence of the BRAF.  V600E mutation are characteristic of sporadic cancers.   She had not seen a doctor in several years.  She does drink alcohol approximately 3 beers a day, may have early symptoms of dementia. She utilizes a walker and did have a fall down her front steps previously.  She is at a fall risk.    Her EKG in the ER was concerning for possible prior infarct.  She stated that she did have feelings of shortness of breath during that event but did not have definitive symptoms of a heart attack before.  Her troponin level was normal. She also has left knee pain and a few years ago had a x-ray of the knee that was consistent with arthritis.  Physical therapy was recommended.  She has not had subsequent follow-up.  She does not have a primary care physician.   We previously discussed alcohol use and in terms of the CAGE questions she answered the concern as affirmative.  But the other  questions were all negative. She has never gone through withdrawal symptoms even when she has not been drinking alcohol for a prolonged period of time.   She has a very supportive family including a sister, Valerie Leonard, and her brother.    11/06/23- CT CHEST, ABDOMEN, AND PELVIS WITH CONTRAST IMPRESSION: 1. Heterogeneous irregular masslike expansion centered in the lower uterine segment compatible with patient's known endometrial carcinoma. 2. Enlarged bilateral iliac side chain and pelvic sidewall lymph nodes, compatible with nodal disease involvement. 3. Scattered pulmonary nodules measuring up to 6 mm, nonspecific but suspicious for metastatic disease. Consider attention on short-term interval follow-up chest CT. 4. Diffuse hepatic steatosis. 5. Cholelithiasis without findings of acute cholecystitis. 6. Colonic stool burden suggestive of constipation. 7.  Aortic Atherosclerosis   Treatment Summary:  June 2025- PMB  09/27/23- Pelvic U/s and Biopsy. Pathology: Moderately differentiated endometrioid adenocarcinoma FIGO II 10/13/23- Consult with Dr Leonce 11/11/23- Consult with Dr Elby 12/02/23- consult with Dr Melanee 12/10/23- admitted to hospital for fever. Anemia requiring Evaluated by Dr Elby for possible pyometra as source of infection. During pelvic exam, profuse bleeding occurred and packing was placed.  12/17/23- consult with Dr Chrystal/rad-onc while hospitalized 12/18/23-01/01/24- palliative pelvic radiation for bleeding.  01/08/24- D1C1- carbo-taxol -pembro 01/29/24- D1C2 - carbo-taxol -pembro 02/19/24- D1C3- carbo-taxol -pembro 04/21/24 D1C6 - carbo-taxol -pembro  Problem List: Patient Active Problem List   Diagnosis Date Noted   Leukocytosis 12/22/2023   Fever 12/17/2023   SIRS (systemic inflammatory response syndrome) (HCC)  12/16/2023   Postmenopausal bleeding 12/16/2023   Protein-calorie malnutrition, severe 12/16/2023   Endometrial carcinoma (HCC) 12/14/2023   HFrEF (heart failure  with reduced ejection fraction) (HCC) 12/03/2023   Endometrial cancer (HCC) 11/11/2023   Anemia 11/04/2023   ABLA (acute blood loss anemia) 09/28/2023   Pressure injury of skin 09/28/2023   Vaginal bleeding 09/27/2023   Severe major depression, single episode, without psychotic features (HCC) 07/26/2020   Depression    Acute delirium 07/25/2020   Acute metabolic encephalopathy    Hypomagnesemia    Chronic alcohol abuse    Hypokalemia 07/24/2020    Past Medical History: Past Medical History:  Diagnosis Date   Acute delirium    Acute metabolic encephalopathy    Anemia    blood loss anemia   Chronic alcohol abuse    Depression    Endometrial cancer (HCC)    Hypokalemia    Hypomagnesemia    Pressure injury of skin    Severe major depression, single episode, without psychotic features (HCC)    Vaginal bleeding     Past Surgical History: No prior surgeries Past Surgical History:  Procedure Laterality Date   IR IMAGING GUIDED PORT INSERTION  12/17/2023   IR PATIENT EVAL TECH 0-60 MINS  12/10/2023   NO PAST SURGERIES      Past Gynecologic History:  Last Menstrual Period: 54 History of Abnormal pap: no,  Last pap: unknown Sexually active: no  OB History:  OB History  No obstetric history on file.    Family History: No family history on file.  Social History: Social History   Socioeconomic History   Marital status: Single    Spouse name: Not on file   Number of children: 0   Years of education: Not on file   Highest education level: Not on file  Occupational History   Not on file  Tobacco Use   Smoking status: Former    Types: Cigarettes, Cigars   Smokeless tobacco: Never  Vaping Use   Vaping status: Never Used  Substance and Sexual Activity   Alcohol use: Yes    Alcohol/week: 3.0 standard drinks of alcohol    Types: 3 Cans of beer per week    Comment: More than 4 glasses of wine, mixed drinks, beer daily.    Drug use: Not Currently    Comment: former  use of marijuana   Sexual activity: Not Currently    Birth control/protection: None  Other Topics Concern   Not on file  Social History Narrative   Lives by herself    Social Drivers of Health   Tobacco Use: Medium Risk (04/22/2024)   Patient History    Smoking Tobacco Use: Former    Smokeless Tobacco Use: Never    Passive Exposure: Not on file  Financial Resource Strain: Low Risk  (10/13/2023)   Received from [facility]   Overall Financial Resource Strain (CARDIA)    Difficulty of Paying Living Expenses: Not very hard  Food Insecurity: No Food Insecurity (12/10/2023)   Epic    Worried About Programme Researcher, Broadcasting/film/video in the Last Year: Never true    Ran Out of Food in the Last Year: Never true  Recent Concern: Food Insecurity - Food Insecurity Present (09/27/2023)   Epic    Worried About Programme Researcher, Broadcasting/film/video in the Last Year: Sometimes true    Ran Out of Food in the Last Year: Sometimes true  Transportation Needs: No Transportation Needs (12/10/2023)   Epic  Lack of Transportation (Medical): No    Lack of Transportation (Non-Medical): No  Recent Concern: Transportation Needs - Unmet Transportation Needs (09/27/2023)   Epic    Lack of Transportation (Medical): Yes    Lack of Transportation (Non-Medical): Yes  Physical Activity: Not on file  Stress: Not on file  Social Connections: Unknown (12/11/2023)   Social Connection and Isolation Panel    Frequency of Communication with Friends and Family: More than three times a week    Frequency of Social Gatherings with Friends and Family: More than three times a week    Attends Religious Services: Patient declined    Database Administrator or Organizations: Patient declined    Attends Banker Meetings: Patient declined    Marital Status: Widowed  Recent Concern: Social Connections - Socially Isolated (09/28/2023)   Social Connection and Isolation Panel    Frequency of Communication with Friends and Family: Never     Frequency of Social Gatherings with Friends and Family: Never    Attends Religious Services: Never    Database Administrator or Organizations: No    Attends Banker Meetings: Never    Marital Status: Widowed  Intimate Partner Violence: Not At Risk (12/10/2023)   Epic    Fear of Current or Ex-Partner: No    Emotionally Abused: No    Physically Abused: No    Sexually Abused: No  Depression (PHQ2-9): Low Risk (04/22/2024)   Depression (PHQ2-9)    PHQ-2 Score: 0  Alcohol Screen: Not on file  Housing: Low Risk (12/10/2023)   Epic    Unable to Pay for Housing in the Last Year: No    Number of Times Moved in the Last Year: 0    Homeless in the Last Year: No  Utilities: Not At Risk (12/10/2023)   Epic    Threatened with loss of utilities: No  Health Literacy: Not on file    Allergies: No Known Allergies  Current Medications: Current Outpatient Medications  Medication Sig Dispense Refill   ALPRAZolam  (XANAX ) 0.25 MG tablet Take 1 tablet (0.25 mg total) by mouth 2 (two) times daily as needed for anxiety. 10 tablet 0   amoxicillin -clavulanate (AUGMENTIN ) 500-125 MG tablet Take 1 tablet by mouth 2 (two) times daily.     [Paused] dexamethasone  (DECADRON ) 4 MG tablet Take 2 tablets (8mg ) by mouth daily starting the day after carboplatin  for 3 days. Take with food (Patient not taking: Reported on 03/16/2024) 30 tablet 1   diphenhydrAMINE  (BENADRYL ) 50 MG capsule Take 1 capsule (50 mg total) by mouth at bedtime as needed for itching, allergies or sleep.     escitalopram  (LEXAPRO ) 5 MG tablet Take 1 tablet (5 mg total) by mouth daily. 30 tablet 0   feeding supplement (ENSURE PLUS HIGH PROTEIN) LIQD Take 237 mLs by mouth 3 (three) times daily between meals. (Patient not taking: Reported on 03/16/2024)     ferrous sulfate  325 (65 FE) MG tablet Take 1 tablet (325 mg total) by mouth daily with breakfast.     folic acid  (FOLVITE ) 1 MG tablet Take 1 tablet (1 mg total) by mouth daily. 30  tablet 0   lidocaine -prilocaine  (EMLA ) cream Apply to affected area once (Patient not taking: Reported on 04/04/2024) 30 g 3   mirtazapine (REMERON) 7.5 MG tablet Take 7.5 mg by mouth at bedtime.     Multiple Vitamin (MULTIVITAMIN WITH MINERALS) TABS tablet Take 1 tablet by mouth daily. 30 tablet 1   ondansetron  (  ZOFRAN ) 8 MG tablet Take 1 tablet (8 mg total) by mouth every 8 (eight) hours as needed for nausea or vomiting. Start on the third day after carboplatin . 30 tablet 1   oxyCODONE  (OXY IR/ROXICODONE ) 5 MG immediate release tablet Take 1 tablet (5 mg total) by mouth every 4 (four) hours as needed for severe pain (pain score 7-10). (Patient not taking: Reported on 03/16/2024) 60 tablet 0   prochlorperazine  (COMPAZINE ) 10 MG tablet Take 1 tablet (10 mg total) by mouth every 6 (six) hours as needed for nausea or vomiting. (Patient not taking: Reported on 03/16/2024) 30 tablet 1   traZODone  (DESYREL ) 50 MG tablet Take 1 tablet (50 mg total) by mouth at bedtime. 30 tablet 0   No current facility-administered medications for this visit.   Review of Systems General:  fatigue Skin: no complaints Eyes: no complaints HEENT: no complaints Breasts: no complaints Pulmonary: shortness of breath with exertion  Cardiac: no complaints Gastrointestinal: decreased appetite Genitourinary/Sexual: no complaints Ob/Gyn: vaginal spotting has resolved Musculoskeletal: no complaints Hematology: no complaints Neurologic/Psych: no complaints   Objective:  Physical Examination:  There were no vitals taken for this visit.   ECOG Performance Status: 2 - Symptomatic, <50% confined to bed  GENERAL: Elderly appearing female in no acute distress; concern for memory related issues HEENT:  PERRL. Thyroid  without masses.  NODES:  No cervical, supraclavicular, axillary, or inguinal lymphadenopathy palpated.  LUNGS:  Clear to auscultation bilaterally.  No wheezes or rhonchi. HEART:  Regular rate and  rhythm. ABDOMEN:  Soft, nontender.  Nondistended. No ascites. No masses. EXTREMITIES:  No peripheral edema.   SKIN:  Clear with no obvious rashes or skin changes.  NEURO:  Nonfocal. Well oriented.  Appropriate affect.  Pelvic: Exam chaperoned by NP and CMA- pt requires 2 person assistance d/t hip rigidity Vulva: normal appearing vulva with no masses, tenderness or lesions;  Vagina: normal vagina, no discharge, exudate, lesion, or erythema;  Adnexa: normal adnexa in size, nontender and no masses;  Uterus: uterus is normal size, shape, consistency and nontender;  Cervix: no cervical motion tenderness and no lesions;  Rectal: not indicated  Lab Review Lab Results  Component Value Date   WBC 4.8 04/24/2024   HGB 10.7 (L) 04/24/2024   HCT 33.4 (L) 04/24/2024   MCV 96.3 04/24/2024   PLT 280 04/24/2024     Chemistry      Component Value Date/Time   NA 144 04/24/2024 0935   K 3.8 04/24/2024 0935   CL 106 04/24/2024 0935   CO2 28 04/24/2024 0935   BUN 10 04/24/2024 0935   CREATININE 0.41 (L) 04/24/2024 0935   CREATININE 0.43 (L) 04/22/2024 0856      Component Value Date/Time   CALCIUM  9.3 04/24/2024 0935   ALKPHOS 95 04/24/2024 0935   AST 26 04/24/2024 0935   AST 21 04/22/2024 0856   ALT 12 04/24/2024 0935   ALT 12 04/22/2024 0856   BILITOT 0.3 04/24/2024 0935   BILITOT <0.2 04/22/2024 0856       Radiologic Imaging:  Per HPI     Assessment:  ZOE GOONAN is a 69 y.o. female diagnosed with grade 2 endometrial cancer, dMMR, p53wt, ER+ with radiologic imaging concerning for stage III C1 disease with grossly enlarged pelvic nodes on CT and findings of pulmonary nodules are indeterminate. She had severe bleeding and anemia and got blood and a short course of radiation to control bleeding.  Good response after 3 cycles of carboplatin -paclitaxel -pembrolizumab  and now  has completed 6 cycles with continued regression on imaging.  Vaginal bleeding has resolved. CT scan recently  shows persistent lower uterine segment mass.  Adenopathy in bilateral iliac areas improved.    Anemia - PMB presented with hemoglobin 5.8. Received several blood transfusions. Source of bleeding was vaginal bleeding from endometrial cancer. She hemorrhaged in clinic and required vaginal packing followed by palliative radiation while hospitalized, completed 01/01/24. Hbg now 11.4.   Medical co-morbidities complicating care: Hypoalbuminemia, History of alcohol abuse with metabolic encephalopathy, Possible prior MI based on EKG, Leukocytosis uncertain etiology, Frailty- resides at [facility]; uses walker for ambulation.  Plan:   Problem List Items Addressed This Visit       Genitourinary   Endometrial cancer (HCC) - Primary   I spoke to the patient and sister about the recommendation for completion surgery to remove the adnexa and uterus.  The patient is living in an assisted living facility and uses a wheelchair due to knee issues and is very reluctant to have surgery even though this would hopefully be an outpatient procedure.     Discussed that just continuing maintenance pembrolizumab  with Dr Melanee and we can see her back in 3 months with CT scan.  If there is growth of the uterine mass can readdress the issue of surgery.     The patient's diagnosis, an outline of the further diagnostic and laboratory studies which will be required, the recommendation, and alternatives were discussed.  All questions were answered to the patient's satisfaction.   Prentice Agent, MD  CC:  Garnette JONETTA Mace,  MD [address] [place],  [place] [address] [phone]

## 2024-05-13 ENCOUNTER — Inpatient Hospital Stay

## 2024-05-13 ENCOUNTER — Inpatient Hospital Stay: Admitting: Oncology

## 2024-05-13 ENCOUNTER — Encounter: Payer: Self-pay | Admitting: Oncology

## 2024-05-13 VITALS — BP 105/67 | HR 87 | Temp 97.8°F | Resp 19 | Ht 61.0 in | Wt 122.4 lb

## 2024-05-13 VITALS — BP 105/69 | HR 100

## 2024-05-13 DIAGNOSIS — C541 Malignant neoplasm of endometrium: Secondary | ICD-10-CM

## 2024-05-13 LAB — CBC WITH DIFFERENTIAL (CANCER CENTER ONLY)
Abs Immature Granulocytes: 0.07 10*3/uL (ref 0.00–0.07)
Basophils Absolute: 0 10*3/uL (ref 0.0–0.1)
Basophils Relative: 1 %
Eosinophils Absolute: 0.1 10*3/uL (ref 0.0–0.5)
Eosinophils Relative: 1 %
HCT: 33.8 % — ABNORMAL LOW (ref 36.0–46.0)
Hemoglobin: 11.1 g/dL — ABNORMAL LOW (ref 12.0–15.0)
Immature Granulocytes: 1 %
Lymphocytes Relative: 30 %
Lymphs Abs: 1.6 10*3/uL (ref 0.7–4.0)
MCH: 31.4 pg (ref 26.0–34.0)
MCHC: 32.8 g/dL (ref 30.0–36.0)
MCV: 95.5 fL (ref 80.0–100.0)
Monocytes Absolute: 0.6 10*3/uL (ref 0.1–1.0)
Monocytes Relative: 12 %
Neutro Abs: 2.8 10*3/uL (ref 1.7–7.7)
Neutrophils Relative %: 55 %
Platelet Count: 249 10*3/uL (ref 150–400)
RBC: 3.54 MIL/uL — ABNORMAL LOW (ref 3.87–5.11)
RDW: 15 % (ref 11.5–15.5)
WBC Count: 5.2 10*3/uL (ref 4.0–10.5)
nRBC: 0 % (ref 0.0–0.2)

## 2024-05-13 LAB — CMP (CANCER CENTER ONLY)
ALT: 8 U/L (ref 0–44)
AST: 21 U/L (ref 15–41)
Albumin: 4.2 g/dL (ref 3.5–5.0)
Alkaline Phosphatase: 103 U/L (ref 38–126)
Anion gap: 12 (ref 5–15)
BUN: 12 mg/dL (ref 8–23)
CO2: 26 mmol/L (ref 22–32)
Calcium: 9.7 mg/dL (ref 8.9–10.3)
Chloride: 102 mmol/L (ref 98–111)
Creatinine: 0.41 mg/dL — ABNORMAL LOW (ref 0.44–1.00)
GFR, Estimated: >60 mL/min
Glucose, Bld: 93 mg/dL (ref 70–99)
Potassium: 3.9 mmol/L (ref 3.5–5.1)
Sodium: 140 mmol/L (ref 135–145)
Total Bilirubin: 0.4 mg/dL (ref 0.0–1.2)
Total Protein: 7.3 g/dL (ref 6.5–8.1)

## 2024-05-13 LAB — TSH: TSH: 0.255 u[IU]/mL — ABNORMAL LOW (ref 0.350–4.500)

## 2024-05-13 MED ORDER — DIPHENHYDRAMINE HCL 50 MG/ML IJ SOLN
25.0000 mg | Freq: Once | INTRAMUSCULAR | Status: AC
  Administered 2024-05-13: 25 mg via INTRAVENOUS
  Filled 2024-05-13: qty 1

## 2024-05-13 MED ORDER — SODIUM CHLORIDE 0.9 % IV SOLN
INTRAVENOUS | Status: DC
  Filled 2024-05-13: qty 250

## 2024-05-13 MED ORDER — SODIUM CHLORIDE 0.9 % IV SOLN
400.0000 mg | Freq: Once | INTRAVENOUS | Status: AC
  Administered 2024-05-13: 400 mg via INTRAVENOUS
  Filled 2024-05-13: qty 16

## 2024-05-13 NOTE — Patient Instructions (Signed)
 [facility] - A DEPT OF [facility]  Discharge Instructions: Thank you for choosing [facility] to provide your oncology and hematology care.  If you have a lab appointment with the Cancer Center, please go directly to the Cancer Center and check in at the registration area.  Wear comfortable clothing and clothing appropriate for easy access to any Portacath or PICC line.   We strive to give you quality time with your provider. You may need to reschedule your appointment if you arrive late (15 or more minutes).  Arriving late affects you and other patients whose appointments are after yours.  Also, if you miss three or more appointments without notifying the office, you may be dismissed from the clinic at the provider's discretion.      For prescription refill requests, have your pharmacy contact our office and allow 72 hours for refills to be completed.    Today you received the following chemotherapy and/or immunotherapy agents Keytruda        To help prevent nausea and vomiting after your treatment, we encourage you to take your nausea medication as directed.  BELOW ARE SYMPTOMS THAT SHOULD BE REPORTED IMMEDIATELY: *FEVER GREATER THAN 100.4 F (38 C) OR HIGHER *CHILLS OR SWEATING *NAUSEA AND VOMITING THAT IS NOT CONTROLLED WITH YOUR NAUSEA MEDICATION *UNUSUAL SHORTNESS OF BREATH *UNUSUAL BRUISING OR BLEEDING *URINARY PROBLEMS (pain or burning when urinating, or frequent urination) *BOWEL PROBLEMS (unusual diarrhea, constipation, pain near the anus) TENDERNESS IN MOUTH AND THROAT WITH OR WITHOUT PRESENCE OF ULCERS (sore throat, sores in mouth, or a toothache) UNUSUAL RASH, SWELLING OR PAIN  UNUSUAL VAGINAL DISCHARGE OR ITCHING   Items with * indicate a potential emergency and should be followed up as soon as possible or go to the Emergency Department if any problems should occur.  Please show the CHEMOTHERAPY ALERT CARD or IMMUNOTHERAPY  ALERT CARD at check-in to the Emergency Department and triage nurse.  Should you have questions after your visit or need to cancel or reschedule your appointment, please contact [facility] - A DEPT OF [facility]  [phone] and follow the prompts.  Office hours are 8:00 a.m. to 4:30 p.m. Monday - Friday. Please note that voicemails left after 4:00 p.m. may not be returned until the following business day.  We are closed weekends and major holidays. You have access to a nurse at all times for urgent questions. Please call the main number to the clinic [phone] and follow the prompts.  For any non-urgent questions, you may also contact your provider using MyChart. We now offer e-Visits for anyone 3 and older to request care online for non-urgent symptoms. For details visit [email or web address].   Also download the MyChart app! Go to the app store, search MyChart, open the app, select [facility], and log in with your MyChart username and password.

## 2024-06-08 ENCOUNTER — Inpatient Hospital Stay

## 2024-06-24 ENCOUNTER — Inpatient Hospital Stay

## 2024-06-24 ENCOUNTER — Inpatient Hospital Stay: Admitting: Oncology

## 2024-08-17 ENCOUNTER — Inpatient Hospital Stay

## 2024-09-15 ENCOUNTER — Ambulatory Visit: Admitting: Radiation Oncology
